# Patient Record
Sex: Male | Born: 1945 | Race: White | Hispanic: No | Marital: Married | Smoking: Never smoker
Health system: Southern US, Community
[De-identification: ages and names within clinical notes are randomized; demographics above are authoritative.]

## PROBLEM LIST (undated history)

## (undated) DIAGNOSIS — E8881 Metabolic syndrome: Secondary | ICD-10-CM

## (undated) DIAGNOSIS — F419 Anxiety disorder, unspecified: Secondary | ICD-10-CM

## (undated) DIAGNOSIS — N529 Male erectile dysfunction, unspecified: Secondary | ICD-10-CM

## (undated) DIAGNOSIS — F329 Major depressive disorder, single episode, unspecified: Secondary | ICD-10-CM

## (undated) DIAGNOSIS — G20A1 Parkinson's disease without dyskinesia, without mention of fluctuations: Secondary | ICD-10-CM

## (undated) DIAGNOSIS — N400 Enlarged prostate without lower urinary tract symptoms: Secondary | ICD-10-CM

## (undated) DIAGNOSIS — E785 Hyperlipidemia, unspecified: Secondary | ICD-10-CM

## (undated) DIAGNOSIS — R296 Repeated falls: Secondary | ICD-10-CM

## (undated) DIAGNOSIS — K648 Other hemorrhoids: Secondary | ICD-10-CM

## (undated) DIAGNOSIS — K5090 Crohn's disease, unspecified, without complications: Secondary | ICD-10-CM

## (undated) DIAGNOSIS — R319 Hematuria, unspecified: Secondary | ICD-10-CM

## (undated) DIAGNOSIS — F32A Depression, unspecified: Secondary | ICD-10-CM

## (undated) DIAGNOSIS — R2689 Other abnormalities of gait and mobility: Secondary | ICD-10-CM

## (undated) DIAGNOSIS — G20 Parkinson's disease: Secondary | ICD-10-CM

## (undated) HISTORY — DX: Major depressive disorder, single episode, unspecified: F32.9

## (undated) HISTORY — DX: Other hemorrhoids: K64.8

## (undated) HISTORY — DX: Crohn's disease, unspecified, without complications: K50.90

## (undated) HISTORY — DX: Parkinson's disease: G20

## (undated) HISTORY — DX: Benign prostatic hyperplasia without lower urinary tract symptoms: N40.0

## (undated) HISTORY — DX: Parkinson's disease without dyskinesia, without mention of fluctuations: G20.A1

## (undated) HISTORY — DX: Depression, unspecified: F32.A

## (undated) HISTORY — DX: Anxiety disorder, unspecified: F41.9

## (undated) HISTORY — DX: Male erectile dysfunction, unspecified: N52.9

## (undated) HISTORY — DX: Metabolic syndrome: E88.810

## (undated) HISTORY — PX: COLONOSCOPY W/ BIOPSIES: SHX1374

## (undated) HISTORY — DX: Metabolic syndrome: E88.81

## (undated) HISTORY — PX: PROSTATE BIOPSY: SHX241

## (undated) HISTORY — PX: OTHER SURGICAL HISTORY: SHX169

## (undated) HISTORY — DX: Hyperlipidemia, unspecified: E78.5

---
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
## 1977-06-12 DIAGNOSIS — K5090 Crohn's disease, unspecified, without complications: Secondary | ICD-10-CM

## 1977-06-12 HISTORY — DX: Crohn's disease, unspecified, without complications: K50.90

## 1993-06-12 HISTORY — PX: OTHER SURGICAL HISTORY: SHX169

## 2000-07-12 ENCOUNTER — Other Ambulatory Visit: Admission: RE | Admit: 2000-07-12 | Discharge: 2000-07-12 | Payer: Self-pay | Admitting: Urology

## 2000-07-12 ENCOUNTER — Encounter (INDEPENDENT_AMBULATORY_CARE_PROVIDER_SITE_OTHER): Payer: Self-pay | Admitting: Specialist

## 2002-08-13 ENCOUNTER — Encounter: Payer: Self-pay | Admitting: Internal Medicine

## 2002-08-13 ENCOUNTER — Ambulatory Visit (HOSPITAL_COMMUNITY): Admission: RE | Admit: 2002-08-13 | Discharge: 2002-08-13 | Payer: Self-pay | Admitting: Internal Medicine

## 2005-11-07 ENCOUNTER — Ambulatory Visit: Payer: Self-pay | Admitting: Internal Medicine

## 2005-11-14 ENCOUNTER — Ambulatory Visit: Payer: Self-pay | Admitting: Internal Medicine

## 2006-09-27 ENCOUNTER — Ambulatory Visit: Payer: Self-pay | Admitting: Internal Medicine

## 2006-10-10 ENCOUNTER — Ambulatory Visit: Payer: Self-pay | Admitting: Internal Medicine

## 2006-10-26 DIAGNOSIS — Z8639 Personal history of other endocrine, nutritional and metabolic disease: Secondary | ICD-10-CM

## 2006-10-26 DIAGNOSIS — F528 Other sexual dysfunction not due to a substance or known physiological condition: Secondary | ICD-10-CM

## 2006-10-26 DIAGNOSIS — K50012 Crohn's disease of small intestine with intestinal obstruction: Secondary | ICD-10-CM | POA: Insufficient documentation

## 2006-10-31 ENCOUNTER — Encounter: Payer: Self-pay | Admitting: Internal Medicine

## 2006-10-31 ENCOUNTER — Ambulatory Visit: Payer: Self-pay | Admitting: Internal Medicine

## 2007-04-10 ENCOUNTER — Ambulatory Visit: Payer: Self-pay | Admitting: Internal Medicine

## 2007-04-10 DIAGNOSIS — R972 Elevated prostate specific antigen [PSA]: Secondary | ICD-10-CM

## 2007-04-10 DIAGNOSIS — E782 Mixed hyperlipidemia: Secondary | ICD-10-CM

## 2007-04-10 DIAGNOSIS — E8881 Metabolic syndrome: Secondary | ICD-10-CM

## 2007-04-10 DIAGNOSIS — F329 Major depressive disorder, single episode, unspecified: Secondary | ICD-10-CM

## 2007-04-19 ENCOUNTER — Encounter: Payer: Self-pay | Admitting: Internal Medicine

## 2007-04-19 ENCOUNTER — Encounter (INDEPENDENT_AMBULATORY_CARE_PROVIDER_SITE_OTHER): Payer: Self-pay | Admitting: *Deleted

## 2007-05-13 ENCOUNTER — Ambulatory Visit: Payer: Self-pay | Admitting: Internal Medicine

## 2007-05-13 LAB — CONVERTED CEMR LAB
ALT: 28 units/L (ref 0–53)
AST: 20 units/L (ref 0–37)
Albumin: 3.8 g/dL (ref 3.5–5.2)
Alkaline Phosphatase: 62 units/L (ref 39–117)
BUN: 14 mg/dL (ref 6–23)
Basophils Absolute: 0 10*3/uL (ref 0.0–0.1)
Basophils Relative: 0.4 % (ref 0.0–1.0)
CO2: 29 meq/L (ref 19–32)
CRP, High Sensitivity: 1 (ref 0.00–5.00)
Calcium: 9.3 mg/dL (ref 8.4–10.5)
Chloride: 107 meq/L (ref 96–112)
Creatinine, Ser: 1 mg/dL (ref 0.4–1.5)
Eosinophils Absolute: 0.1 10*3/uL (ref 0.0–0.6)
Eosinophils Relative: 2.6 % (ref 0.0–5.0)
GFR calc Af Amer: 98 mL/min
GFR calc non Af Amer: 81 mL/min
Glucose, Bld: 107 mg/dL — ABNORMAL HIGH (ref 70–99)
HCT: 45.6 % (ref 39.0–52.0)
Hemoglobin: 15.6 g/dL (ref 13.0–17.0)
Lymphocytes Relative: 25.4 % (ref 12.0–46.0)
MCHC: 34.2 g/dL (ref 30.0–36.0)
MCV: 87 fL (ref 78.0–100.0)
Monocytes Absolute: 0.4 10*3/uL (ref 0.2–0.7)
Monocytes Relative: 9.5 % (ref 3.0–11.0)
Neutro Abs: 3 10*3/uL (ref 1.4–7.7)
Neutrophils Relative %: 62.1 % (ref 43.0–77.0)
Platelets: 233 10*3/uL (ref 150–400)
Potassium: 4.3 meq/L (ref 3.5–5.1)
RBC: 5.25 M/uL (ref 4.22–5.81)
RDW: 12.2 % (ref 11.5–14.6)
Sodium: 142 meq/L (ref 135–145)
Total Bilirubin: 0.6 mg/dL (ref 0.3–1.2)
Total Protein: 6.3 g/dL (ref 6.0–8.3)
WBC: 4.7 10*3/uL (ref 4.5–10.5)

## 2007-06-27 ENCOUNTER — Encounter: Payer: Self-pay | Admitting: Internal Medicine

## 2007-06-27 ENCOUNTER — Ambulatory Visit: Payer: Self-pay | Admitting: Internal Medicine

## 2007-07-01 DIAGNOSIS — F411 Generalized anxiety disorder: Secondary | ICD-10-CM

## 2007-07-01 DIAGNOSIS — N400 Enlarged prostate without lower urinary tract symptoms: Secondary | ICD-10-CM

## 2007-07-16 ENCOUNTER — Ambulatory Visit: Payer: Self-pay | Admitting: Internal Medicine

## 2007-08-07 ENCOUNTER — Ambulatory Visit: Payer: Self-pay | Admitting: Internal Medicine

## 2010-06-21 ENCOUNTER — Other Ambulatory Visit: Payer: Self-pay | Admitting: Internal Medicine

## 2010-06-21 ENCOUNTER — Ambulatory Visit
Admission: RE | Admit: 2010-06-21 | Discharge: 2010-06-21 | Payer: Self-pay | Source: Home / Self Care | Attending: Internal Medicine | Admitting: Internal Medicine

## 2010-06-21 ENCOUNTER — Encounter: Payer: Self-pay | Admitting: Internal Medicine

## 2010-06-21 DIAGNOSIS — G20 Parkinson's disease: Secondary | ICD-10-CM | POA: Insufficient documentation

## 2010-06-21 DIAGNOSIS — R269 Unspecified abnormalities of gait and mobility: Secondary | ICD-10-CM | POA: Insufficient documentation

## 2010-06-21 LAB — CBC WITH DIFFERENTIAL/PLATELET
Basophils Absolute: 0 10*3/uL (ref 0.0–0.1)
Basophils Relative: 0.4 % (ref 0.0–3.0)
Eosinophils Absolute: 0.2 10*3/uL (ref 0.0–0.7)
Eosinophils Relative: 2.7 % (ref 0.0–5.0)
HCT: 49.4 % (ref 39.0–52.0)
Hemoglobin: 16.9 g/dL (ref 13.0–17.0)
Lymphocytes Relative: 25.2 % (ref 12.0–46.0)
Lymphs Abs: 1.5 10*3/uL (ref 0.7–4.0)
MCHC: 34.3 g/dL (ref 30.0–36.0)
MCV: 86.3 fl (ref 78.0–100.0)
Monocytes Absolute: 0.4 10*3/uL (ref 0.1–1.0)
Monocytes Relative: 7.3 % (ref 3.0–12.0)
Neutro Abs: 3.8 10*3/uL (ref 1.4–7.7)
Neutrophils Relative %: 64.4 % (ref 43.0–77.0)
Platelets: 254 10*3/uL (ref 150.0–400.0)
RBC: 5.72 Mil/uL (ref 4.22–5.81)
RDW: 13.1 % (ref 11.5–14.6)
WBC: 5.9 10*3/uL (ref 4.5–10.5)

## 2010-06-21 LAB — BASIC METABOLIC PANEL
BUN: 14 mg/dL (ref 6–23)
CO2: 30 mEq/L (ref 19–32)
Calcium: 9.6 mg/dL (ref 8.4–10.5)
Chloride: 103 mEq/L (ref 96–112)
Creatinine, Ser: 0.9 mg/dL (ref 0.4–1.5)
GFR: 96.92 mL/min (ref >60.00)
Glucose, Bld: 92 mg/dL (ref 70–99)
Potassium: 4.4 mEq/L (ref 3.5–5.1)
Sodium: 141 mEq/L (ref 135–145)

## 2010-06-21 LAB — HEPATIC FUNCTION PANEL
ALT: 33 U/L (ref 0–53)
AST: 23 U/L (ref 0–37)
Albumin: 4.3 g/dL (ref 3.5–5.2)
Alkaline Phosphatase: 84 U/L (ref 39–117)
Bilirubin, Direct: 0 mg/dL (ref 0.0–0.3)
Total Bilirubin: 0.8 mg/dL (ref 0.3–1.2)
Total Protein: 7.1 g/dL (ref 6.0–8.3)

## 2010-06-21 LAB — TSH: TSH: 1.2 u[IU]/mL (ref 0.35–5.50)

## 2010-07-10 LAB — CONVERTED CEMR LAB
ALT: 25 units/L (ref 0–53)
AST: 19 units/L (ref 0–37)
Albumin: 4.1 g/dL (ref 3.5–5.2)
Alkaline Phosphatase: 60 units/L (ref 39–117)
BUN: 14 mg/dL (ref 6–23)
Basophils Absolute: 0 10*3/uL (ref 0.0–0.1)
Basophils Relative: 0.5 % (ref 0.0–1.0)
Bilirubin, Direct: 0.1 mg/dL (ref 0.0–0.3)
CO2: 30 meq/L (ref 19–32)
Calcium: 9.4 mg/dL (ref 8.4–10.5)
Chloride: 105 meq/L (ref 96–112)
Cholesterol, target level: 200 mg/dL
Cholesterol: 263 mg/dL (ref 0–200)
Creatinine, Ser: 1.1 mg/dL (ref 0.4–1.5)
Direct LDL: 199.7 mg/dL
Eosinophils Absolute: 0.1 10*3/uL (ref 0.0–0.6)
Eosinophils Relative: 2.8 % (ref 0.0–5.0)
GFR calc Af Amer: 88 mL/min
GFR calc non Af Amer: 73 mL/min
Glucose, Bld: 104 mg/dL — ABNORMAL HIGH (ref 70–99)
HCT: 47.5 % (ref 39.0–52.0)
HDL goal, serum: 40 mg/dL
HDL: 43.2 mg/dL (ref >39.0)
Hemoglobin: 16.3 g/dL (ref 13.0–17.0)
Hgb A1c MFr Bld: 5.4 % (ref 4.6–6.0)
LDL Goal: 160 mg/dL
Lymphocytes Relative: 29.6 % (ref 12.0–46.0)
MCHC: 34.2 g/dL (ref 30.0–36.0)
MCV: 87 fL (ref 78.0–100.0)
Monocytes Absolute: 0.5 10*3/uL (ref 0.2–0.7)
Monocytes Relative: 9.3 % (ref 3.0–11.0)
Neutro Abs: 2.8 10*3/uL (ref 1.4–7.7)
Neutrophils Relative %: 57.8 % (ref 43.0–77.0)
PSA: 2.17 ng/mL (ref 0.10–4.00)
Platelets: 242 10*3/uL (ref 150–400)
Potassium: 4.2 meq/L (ref 3.5–5.1)
RBC: 5.46 M/uL (ref 4.22–5.81)
RDW: 12.5 % (ref 11.5–14.6)
Sodium: 140 meq/L (ref 135–145)
TSH: 1.19 microintl units/mL (ref 0.35–5.50)
Total Bilirubin: 0.8 mg/dL (ref 0.3–1.2)
Total CHOL/HDL Ratio: 6.1
Total Protein: 6.6 g/dL (ref 6.0–8.3)
Triglycerides: 139 mg/dL (ref 0–149)
VLDL: 28 mg/dL (ref 0–40)
WBC: 4.9 10*3/uL (ref 4.5–10.5)

## 2010-07-14 NOTE — Progress Notes (Signed)
Summary: Patient List of Concerns  Patient List of Concerns   Imported By: Edmonia James 06/28/2010 11:04:30  _____________________________________________________________________  External Attachment:    Type:   Image     Comment:   External Document

## 2010-07-14 NOTE — Assessment & Plan Note (Signed)
Summary: cpx/fasting/kn   Vital Signs:  Patient profile:   65 year old male Height:      73.25 inches Weight:      253 pounds BMI:     33.27 Temp:     98.6 degrees F oral Pulse rate:   84 / minute Resp:     14 per minute BP sitting:   130 / 90  (left arm) Cuff size:   large  Vitals Entered By: Georgette Dover CMA (June 21, 2010 11:06 AM) CC: CPX with fasting labs  Comments Patient given information for Shingles Vaccine/ will check with insurance for coverage    CC:  CPX with fasting labs .  History of Present Illness:    Mr Kyle Ball is here for a physical; he has multiple concerns (see scanned typed sheet). Hyperlipidemia Follow-Up:     Tricor & Crestor have not been taken since 2009.  Other symptoms include dypsnea due to deconditioning.  The patient denies the following symptoms: chest pain/pressure, exercise intolerance, palpitations, syncope, and pedal edema.  Dietary compliance has been fair- good , mainly Atkin's low carb.  The patient reports exercising occasionally.  Adjunctive measures currently used by the patient include 81 mg  ASA.    Preventive Screening-Counseling & Management  Alcohol-Tobacco     Smoking Status: never  Current Medications (verified): 1)  Wellbutrin Sr 150 Mg  Tb12 (Bupropion Hcl) .... Take One Tablet Twice Daily 2)  Adderall 5 Mg  Tabs (Amphetamine-Dextroamphetamine) .... Take Two Tabs Daily 3)  Flomax 0.4 Mg  Cp24 (Tamsulosin Hcl) .... Take One Tablet Daily 4)  Ambien 5 Mg  Tabs (Zolpidem Tartrate) .... Take One Tablet As Needed 5)  Viagra 100 Mg  Tabs (Sildenafil Citrate) .... As Needed 6)  Aspirin 81 Mg Tabs (Aspirin) .Marland Kitchen.. 1 By Mouth Once Daily  Allergies: 1)  ! Pcn  Past History:  Past Medical History: INTERNAL HEMORRHOIDS (ICD-455.0) BENIGN PROSTATIC HYPERTROPHY, HX OF (ICD-V13.8), Dr Risa Grill DISORDER, DYSMETABOLIC SYNDROME X (OEU-235.3) ELEVATED PROSTATE SPECIFIC ANTIGEN (ICD-790.93), PMH of  DISORDER, DEPRESSIVE NEC (ICD-311), Dr  Dorann Ou HYPERLIPIDEMIA (ICD-272.2): NMR Lipoprofile 2008: LDL 132(2894/2274), HDL 34, TG 359. ERECTILE DYSFUNCTION (ICD-302.72) CROHN'S DISEASE (ICD-555.9), PMH of   Past Surgical History: Quadriceps rupture 1966: Crohn's of  caecum with fistula  ; terminal  ileum & appendix  resected 1995; prostate biopsy  X 2 Colonoscopy 1999, 2004, 2009. ? Crohn's recurrence @ anastomosis 2009, Dr Carlean Purl  Family History: Family History Diabetes 1st degree relative Family History Hypertension Father: HTN,DM, CVA Mother: liver cancer ,  metastatic from  uterus Siblings: bro: testicular  cancer , sister: fibromyalgia  Social History: no diet Occupation: Licensed conveyancer Married Never Smoked Alcohol use-yes  Review of Systems  The patient denies anorexia, fever, vision loss, decreased hearing, prolonged cough, hemoptysis, abdominal pain, melena, hematochezia, severe indigestion/heartburn, hematuria, suspicious skin lesions, unusual weight change, abnormal bleeding, enlarged lymph nodes, and angioedema.         Weight "yo yos". ENT:  Complains of difficulty swallowing; denies hoarseness. MS:  Complains of joint pain and muscle aches; denies joint redness and joint swelling. Neuro:  Complains of memory loss; Writing becomes "cramped" ; gait "suffling". ? dysphagia with cheese rarely. Psych:  ? slow movements of UE; ? shuffling gait.  Physical Exam  General:  Decreased facial expression,in no acute distress; ,appropriate and cooperative throughout examination Head:  Normocephalic and atraumatic without obvious abnormalities. No apparent alopecia or balding. Eyes:  No corneal or conjunctival inflammation  noted. EOMI. Perrla. Funduscopic exam benign, without hemorrhages, exudates or papilledema. Field of  Vision grossly normal. Ears:  External ear exam shows no significant lesions or deformities.  Otoscopic examination reveals clear canals, tympanic membranes are intact bilaterally  without bulging, retraction, inflammation or discharge. Hearing is grossly normal bilaterally. Nose:  External nasal examination shows no deformity or inflammation. Nasal mucosa are pink and moist without lesions or exudates. Septal dislocation Mouth:  Oral mucosa and oropharynx without lesions or exudates.  Teeth in good repair. Neck:  No deformities, masses, or tenderness noted. Lungs:  Normal respiratory effort, chest expands symmetrically. Lungs are clear to auscultation, no crackles or wheezes. Heart:  normal rate, regular rhythm, no gallop, no rub, no JVD, no HJR, and LDJTT0/1-7   /6 systolic murmur.   Abdomen:  Bowel sounds positive,abdomen soft and non-tender without masses, organomegaly . Weakness L inguinal area  w/o frank frank hernia. Mid line op scar Genitalia:  Testes bilaterally descended without nodularity, tenderness or masses. No scrotal masses or lesions. No penis lesions or urethral discharge.L varicocele.   Msk:  No deformity or scoliosis noted of thoracic or lumbar spine.   Pulses:  R and L carotid,radial,dorsalis pedis and posterior tibial pulses are full and equal bilaterally Extremities:  No clubbing, cyanosis, edema, or deformity noted with normal full range of motion of all joints.  L> R knee crepitus Neurologic:   oriented X3, cranial nerves II-XII intact, strength normal in all extremities, sensation intact to light touch, gait normal except decreased arm swing, DTRs symmetrical and normal, finger-to-nose normal, and Romberg negative.  Fine , non pil rolling tremor of hands Skin:  Intact without suspicious lesions or rashes. Minor keratoses of hands Cervical Nodes:  No lymphadenopathy noted Axillary Nodes:  No palpable lymphadenopathy Psych:  normally interactive, good eye contact, and not anxious appearing.     Impression & Recommendations:  Problem # 1:  ROUTINE GENERAL MEDICAL EXAM@HEALTH  CARE FACL (ICD-V70.0)  Orders: EKG w/ Interpretation (93000) Venipuncture  (79390) TLB-BMP (Basic Metabolic Panel-BMET) (30092-ZRAQTMA) TLB-CBC Platelet - w/Differential (85025-CBCD) TLB-Hepatic/Liver Function Pnl (80076-HEPATIC) TLB-TSH (Thyroid Stimulating Hormone) (84443-TSH) T- * Misc. 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 medication effect  Problem # 3:  ABNORMALITY OF GAIT (ICD-781.2)  Problem # 4:  HYPERLIPIDEMIA (LKT-625.2)  The following medications were removed from the medication list:    Tricor 145 Mg Tabs (Fenofibrate) .Marland Kitchen... 1 qd    Crestor 20 Mg Tabs (Rosuvastatin calcium) .Marland Kitchen... As directed  Problem # 5:  CROHN'S DISEASE (ICD-555.9)  Complete Medication List: 1)  Wellbutrin Sr 150 Mg Tb12 (Bupropion hcl) .... Take one tablet twice daily 2)  Adderall 5 Mg Tabs (Amphetamine-dextroamphetamine) .... Take two tabs daily 3)  Flomax 0.4 Mg Cp24 (Tamsulosin hcl) .... Take one tablet daily 4)  Ambien 5 Mg Tabs (Zolpidem tartrate) .... Take one tablet as needed 5)  Viagra 100 Mg Tabs (Sildenafil citrate) .... As needed 6)  Aspirin 81 Mg Tabs (Aspirin) .Marland Kitchen.. 1 by mouth once daily  Other Orders: Tdap => 27yr IM ((63893 Admin 1st Vaccine ([phone]  Patient Instructions: 1)  Neurology referral if labs are normal. 2)  It is important that you exercise regularly at least 20 minutes 5 times a week. If you develop chest pain, have severe difficulty breathing, or feel very tired , stop exercising immediately and seek medical attention. 3)  Take an Aspirin every day. Avoid HFCS sugar as discussed.   Orders Added:  1)  Tdap => 47yr IM [90715] 2)  Admin 1st Vaccine [90471] 3)  Est. Patient 40-64 years [99396] 4)  EKG w/ Interpretation [93000] 5)  Venipuncture [36415] 6)  TLB-BMP (Basic Metabolic Panel-BMET) [[94834-FHSVEXO]7)  TLB-CBC Platelet - w/Differential [85025-CBCD] 8)  TLB-Hepatic/Liver Function Pnl [80076-HEPATIC] 9)  TLB-TSH (Thyroid Stimulating Hormone) [84443-TSH] 10)  T- * Misc.  Laboratory test [[phone]  Immunizations Administered:  Tetanus Vaccine:    Vaccine Type: Tdap    Site: right deltoid    Mfr: GlaxoSmithKline    Dose: 0.5 ml    Route: IM    Given by: CGeorgette DoverCMA    Exp. Date: 03/31/2012    Lot #: [id number]   VIS given: 04/29/08 version given June 21, 2010.   Immunizations Administered:  Tetanus Vaccine:    Vaccine Type: Tdap    Site: right deltoid    Mfr: GlaxoSmithKline    Dose: 0.5 ml    Route: IM    Given by: CGeorgette DoverCMA    Exp. Date: 03/31/2012    Lot #: [id number]   VIS given: 04/29/08 version given June 21, 2010.

## 2010-08-03 ENCOUNTER — Encounter: Payer: Self-pay | Admitting: Internal Medicine

## 2010-08-03 ENCOUNTER — Ambulatory Visit (INDEPENDENT_AMBULATORY_CARE_PROVIDER_SITE_OTHER): Payer: BC Managed Care – PPO | Admitting: Internal Medicine

## 2010-08-03 DIAGNOSIS — G259 Extrapyramidal and movement disorder, unspecified: Secondary | ICD-10-CM

## 2010-08-03 DIAGNOSIS — E785 Hyperlipidemia, unspecified: Secondary | ICD-10-CM

## 2010-08-09 NOTE — Assessment & Plan Note (Signed)
Summary: to discuss the labs--ph   Vital Signs:  Patient profile:   65 year old male Height:      73.25 inches Weight:      253 pounds Temp:     98.2 degrees F oral Pulse rate:   80 / minute Resp:     15 per minute BP sitting:   150 / 90  (left arm)  Vitals Entered By: Kyle Ball CMA (August 03, 2010 12:09 PM) CC: review lab result   CC:  review lab result.  History of Present Illness:    Labs reviewed & risks discussed with him & his wife Kyle Ball. Very high long term risk suggested by very  significant abnormalities on [facility]  Panel.These results support risk suggested by prior NMR Lipoprofile which was also reviewed. Role of High Fructose Corn Syrup in raising TG discussed. He  denies the following symptoms: chest pain/pressure, exercise intolerance, dypsnea, palpitations, syncope, and pedal edema.  Dietary compliance has been fair.  The patient reports exercising occasionally , but he plans  to initiate a program.                                                                                                The question of a movement disorder was discussed. Kyle Ball expresses concern . I recommended that this be discussed with Dr Kyle Ball & Neurology referral be made if signs & symptoms are not felt to be med related. I stated I did not feel this is iatrogenic.  Current Medications (verified): 1)  Wellbutrin Sr 150 Mg  Tb12 (Bupropion Hcl) .... Take One Tablet Twice Daily 2)  Adderall 5 Mg  Tabs (Amphetamine-Dextroamphetamine) .... Take Two Tabs Daily 3)  Flomax 0.4 Mg  Cp24 (Tamsulosin Hcl) .... Take One Tablet Daily 4)  Ambien 5 Mg  Tabs (Zolpidem Tartrate) .... Take One Tablet As Needed 5)  Viagra 100 Mg  Tabs (Sildenafil Citrate) .... As Needed 6)  Aspirin 81 Mg Tabs (Aspirin) .Marland Kitchen.. 1 By Mouth Once Daily  Allergies (verified): 1)  ! Pcn  Physical Exam  General:  well-nourished,in no acute distress;  cooperative throughout examination but some "la belle indifference"  suggested. Minimal facial expression Neurologic:  oriented X3 but he repeated wrong answer in 4 option quiz. Kyle Ball pointed this out to him.   Psych:  interactive but  flat, subdued  affect     Impression & Recommendations:  Problem # 1:  HYPERLIPIDEMIA (ICD-272.2)  His updated medication list for this problem includes:    Atorvastatin Calcium 20 Mg Tabs (Atorvastatin calcium) .Marland Kitchen... 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  Complete Medication List: 1)  Wellbutrin Sr 150 Mg Tb12 (Bupropion hcl) .... Take one tablet twice daily 2)  Adderall 5 Mg Tabs (Amphetamine-dextroamphetamine) .... Take two tabs daily 3)  Flomax 0.4 Mg Cp24 (Tamsulosin hcl) .... Take one tablet daily 4)  Ambien 5 Mg Tabs (Zolpidem tartrate) .... Take one tablet as needed 5)  Viagra 100 Mg Tabs (Sildenafil citrate) .Marland KitchenMarland KitchenMarland Kitchen  As needed 6)  Aspirin 81 Mg Tabs (Aspirin) .Marland Kitchen.. 1 by mouth once daily 7)  Atorvastatin Calcium 20 Mg Tabs (Atorvastatin calcium) .Marland Kitchen.. 1 once daily  Patient Instructions: 1)   Please see Dr Kyle Ball & discuss need for Neurollogy  consultation.Avoid High Fructose Corn Syrup ; consider low carb diet (NOT Atkins') 2)  Please schedule a follow-up appointment in 3 months. 3)  Hepatic Panel prior to visit, ICD-9:995.20 4)  Lipid Panel prior to visit, ICD-9:272.4 5)  It is important that you exercise regularly at least 20 minutes 5 times a week. If you develop chest pain, have severe difficulty breathing, or feel very tired , stop exercising immediately and seek medical attention.  6)  Take an Aspirin every day. Prescriptions: ATORVASTATIN CALCIUM 20 MG TABS (ATORVASTATIN CALCIUM) 1 once daily  #30 x 2   Entered and Authorized by:   Kyle Melnick MD   Signed by:   Kyle Melnick MD on 08/03/2010   Method used:   Print then Give to Patient   RxID:   [id number]    Orders Added: 1)   Est. Patient Level III [29562] 2)  Nutrition Referral [Nutrition]

## 2010-10-25 NOTE — Assessment & Plan Note (Signed)
[place]                         GASTROENTEROLOGY OFFICE NOTE   MIHIR, FLANIGAN                    MRN:          [id number]  DATE:05/13/2007                            DOB:          02-26-1946    CHIEF COMPLAINT:  Abdominal pain, history of Crohn's.   Mr. Barriere is a 65 year old white man with known history of Crohn's  disease.  Diagnosed years ago in [place], followed in [place], [place]  for years.  I last saw him in 2005, January.  He had been scheduled for  a colonoscopy to reassess.  He never could get that done, due to a  business schedule and he felt well, and so he did not have it done.  Earlier in November, he developed some relatively acute right upper  quadrant pain, felt like he needed to go to the bathroom and defecate  but could not, nauseous, did not vomit, and eventually that passed.  He  did take 60 mg of old prednisone.  He has come in today.  I have had him  draw labs.  They are not back yet.  He feels fine at this time.  He is  trying to lose weight.  He has been eating a lot of fresh fruit, raw or  steamed vegetables mainly.  He is not complaining of significant  diarrhea or constipation, and he feels okay at this time.   PAST MEDICAL HISTORY/PROBLEMS:  1. DRUG ALLERGY TO PENICILLIN.  2. Crohn's disease status post right hemicolectomy and entero-antero      fistula repair.  3. Last colonoscopy 1998 with mild ileitis and small bowel follow      through in 2004.  He has had minimal changes at the anastomosis.      He has not been on any Crohn's therapy for some time.  4. Dyslipidemia with hypertriglyceridemia.  5. Anxiety depression.  6. Prior quadriceps rupture.  7. Benign prostatic hypertrophy followed by Dr. Risa Grill.  8. Erectile dysfunction.  9. Obesity.   FAMILY HISTORY:  Uterine cancer in his mother, he thinks.  Diabetes and  heart disease in his father.  No colon cancer of inflammatory bowel  disease.   SOCIAL HISTORY:  He is separated.  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  Uses some alcohol.  No tobacco or drugs.   REVIEW OF SYSTEMS:  See my medical history form.   PHYSICAL EXAMINATION:  GENERAL:  Reveals an obese, pleasant white man,  height 6 feet 2, weight 239 pounds, blood pressure 138/74, pulse 80.  HEENT:  Eyes anicteric.  ENT:  Normal lips, nose.  NECK:  Supple, no thyromegaly or mass.  CHEST:  Clear.  HEART:  S1, S2, no rubs or gallops.  ABDOMEN:  Soft, nontender, no organomegaly or mass.  RECTAL:  Deferred.  LYMPHATIC:  No groin, neck or supraclavicular adenopathy detected.  LOWER EXTREMITIES:  Free of edema.  SKIN:  Warm, dry.  No acute rash in areas inspected of the trunk, in the  lower extremity, torso and the face.  NEURO/PSYCH:  Alert and oriented x3.  Grossly nonfocal, appropriate  affect.  ASSESSMENT:  Right upper quadrant pain, transient.  He certainly could  have had a transient obstruction.  He has been eating more fiber rich  foods, and sometimes they do not adjust properly, and he could have had  an obstruction for something like that or simply from recurrent Crohn's  disease, which is also possible.  Adhesions could have been related as  well.   PLAN:  1. Follow up on the labs we ordered today.  2. Schedule a colonoscopy.  He may not be able to do this until      January.  3. Stay on a low residue diet for the time being.  He is congratulated      on trying to lose weight.  That is important, but I am concerned      about the high fiber foods and the possibility of obstruction from      them.   Further plans pending above.  No specific therapy at this time.     Gatha Mayer, MD,FACG  Electronically Signed    CEG/MedQ  DD: 05/13/2007  DT: 05/13/2007  Job #: [id number]   cc:   Darrick Penna. Linna Darner, MD,FACP,FCCP  Bernestine Amass, M.D.

## 2010-10-25 NOTE — Assessment & Plan Note (Signed)
[place]                         GASTROENTEROLOGY OFFICE NOTE   ORLANDA, LEMMERMAN                    MRN:          [id number]  DATE:07/16/2007                            DOB:          12/15/1945    CHIEF COMPLAINT:  Followup of Crohn's disease.   This is a discussion today.   His height was 6 feet 2 inches, weight was 244 pounds, pulse 72, blood  pressure 122/82.   Mr. Yandow and I discussed his Crohn's disease.  He has a couple of  ulcers at his anastomosis after his right hemicolectomy.  This  colonoscopy was performed June 27, 2007.  Other than that it looked  normal.  He has had rare possible obstructive symptoms a couple of times  since 2003.  He is not on any Crohn's specific therapy at this time.  We  spent 20 minutes today discussing his Crohn's disease and history and  going over things.  I reviewed the nature of the disease and the  possibility of going on medication or not, i.e., observation phase.  He  has really done very well over the years without any medication.  We  discussed the possibility of investigating the small bowel further, but  again, he has minimal, if any, symptoms, and when he does have these  possible flares they only last for a couple of days which raises the  question of whether it is truly a Crohn's flare or not.  He is asking  whether or not he should have his B12 level checked.  He has never had a  DEXA scan, though he has had many years of prednisone in the 1970s and  1980s, it sounds like.   ASSESSMENT:  Crohn's ileitis, status post right hemicolectomy for that  with minimal symptoms since that time.  He did have a small bowel  fistula repair when he had his right hemicolectomy in the early 1990s as  well.   PLAN:  1. Check a B12 level and a 25-hydroxy vitamin D level given his      situation.  He wants this added to labs Dr. Linna Darner is doing in      February.  We will do so and notify Dr.  Clayborn Heron staff.  2. DEXA bone density scanning is ordered given his previous history of      prednisone use.  He is at risk for osteopenia and osteoporosis.  3. He needs a screening colonoscopy for polyps and cancer in 10 years,      investigate signs or symptoms earlier.  4. I will see him back as needed at this point, though likely within a      year would be reasonable.     Gatha Mayer, MD,FACG  Electronically Signed    CEG/MedQ  DD: 07/16/2007  DT: 07/16/2007  Job #: [id number]   cc:   Darrick Penna. Linna Darner, MD,FACP,FCCP

## 2010-10-31 ENCOUNTER — Other Ambulatory Visit: Payer: Self-pay | Admitting: *Deleted

## 2010-10-31 DIAGNOSIS — T887XXA Unspecified adverse effect of drug or medicament, initial encounter: Secondary | ICD-10-CM

## 2010-10-31 DIAGNOSIS — E785 Hyperlipidemia, unspecified: Secondary | ICD-10-CM

## 2010-11-01 ENCOUNTER — Other Ambulatory Visit (INDEPENDENT_AMBULATORY_CARE_PROVIDER_SITE_OTHER): Payer: BC Managed Care – PPO

## 2010-11-01 ENCOUNTER — Other Ambulatory Visit (INDEPENDENT_AMBULATORY_CARE_PROVIDER_SITE_OTHER): Payer: BC Managed Care – PPO | Admitting: Internal Medicine

## 2010-11-01 DIAGNOSIS — E785 Hyperlipidemia, unspecified: Secondary | ICD-10-CM

## 2010-11-01 DIAGNOSIS — T887XXA Unspecified adverse effect of drug or medicament, initial encounter: Secondary | ICD-10-CM

## 2010-11-01 DIAGNOSIS — Z13220 Encounter for screening for lipoid disorders: Secondary | ICD-10-CM

## 2010-11-01 LAB — HEPATIC FUNCTION PANEL: Total Bilirubin: 0.8 mg/dL (ref 0.3–1.2)

## 2010-11-01 LAB — LIPID PANEL
Cholesterol: 176 mg/dL (ref 0–200)
Total CHOL/HDL Ratio: 4
Triglycerides: 248 mg/dL — ABNORMAL HIGH (ref 0.0–149.0)
VLDL: 49.6 mg/dL — ABNORMAL HIGH (ref 0.0–40.0)

## 2010-11-10 ENCOUNTER — Encounter: Payer: Self-pay | Admitting: Internal Medicine

## 2010-11-10 ENCOUNTER — Ambulatory Visit (INDEPENDENT_AMBULATORY_CARE_PROVIDER_SITE_OTHER): Payer: BC Managed Care – PPO | Admitting: Internal Medicine

## 2010-11-10 VITALS — BP 138/86 | HR 71 | Wt 240.8 lb

## 2010-11-10 DIAGNOSIS — E782 Mixed hyperlipidemia: Secondary | ICD-10-CM

## 2010-11-10 MED ORDER — ATORVASTATIN CALCIUM 20 MG PO TABS: 20.0000 mg | ORAL_TABLET | Freq: Every day | ORAL | Status: DC

## 2010-11-10 NOTE — Patient Instructions (Signed)
Preventive Health Care: Exercise at least 30-45 minutes a day,  3-4 days a week.  Eat a low-fat diet with lots of fruits and vegetables, up to 7-9 servings per day. Avoid obesity; your goal is waist measurement < 40 inches.Consume less than 40 grams of sugar per day from foods & drinks with High Fructose Corn Sugar as #2,3 or # 4 on label. The most common cause of elevated triglycerides is the ingestion of sugar from high fructose corn syrup sources. As TG go up, HDL or good cholesterol goes down. Also uric acid which causes gout will go up. Alcohol If you drink, do it moderately,less than 9 drinks per week, preferably less than 6 @ most. Recheck fasting Lipids & A1c in 4 months (277.7)

## 2010-11-10 NOTE — Progress Notes (Signed)
  Subjective:    Patient ID: Kyle Ball, male    DOB: 25-Feb-1946, 65 y.o.   MRN: [id number]  HPI Dyslipidemia assessment: Prior Advanced Lipid Testing: [facility] Panel & NMR Lipoprofile.   Family history of premature CAD/ MI: father @ 42 .  Nutrition: no .  Exercise: walking 2X/ week . Diabetes : no . HTN: no. Smoking history  : never .   Weight :  Down 15#. ROS: fatigue: some ; chest pain : no ;claudication: no; palpitations: no; abd pain/bowel changes: minor bowel symptoms ; myalgias:no;  syncope : no ; memory loss: no;skin changes: no. Lab results reviewed  & risks discussed    Review of Systems     Objective:   Physical Exam General appearance is one of good health and nourishment.Eyes: No conjunctival inflammation or scleral icterus is present.  Heart:  Normal rate and regular rhythm. S1 and S2 normal without gallop, murmur, click, rub or other extra sounds     Lungs:Chest clear to auscultation; no wheezes, rhonchi,rales ,or rubs present.No increased work of breathing.   Skin:Warm & dry.  Intact without suspicious lesions or rashes ; no jaundice or tenting             Assessment & Plan:  Dyslipidemia; dramatic improvement in the LDL which greater than 90% reduction in risk. Major component at this time appears to be dietary with triglycerides > 150.  Plan: No change in medications; dietary interventions to reduce High Fructose Corn Syrup sugar sources.

## 2010-12-26 ENCOUNTER — Encounter: Payer: BC Managed Care – PPO | Attending: Internal Medicine | Admitting: *Deleted

## 2010-12-26 ENCOUNTER — Encounter: Payer: Self-pay | Admitting: *Deleted

## 2010-12-26 DIAGNOSIS — E785 Hyperlipidemia, unspecified: Secondary | ICD-10-CM | POA: Insufficient documentation

## 2010-12-26 DIAGNOSIS — Z713 Dietary counseling and surveillance: Secondary | ICD-10-CM | POA: Insufficient documentation

## 2010-12-26 NOTE — Patient Instructions (Addendum)
Goals:  Discontinue meal skipping.  Aim for 1800 calories per day with lean protein, low fat dairy, more whole grains and fruits/vegetables.  Decrease alcohol consumption.  Increase exercise to 3-4 days week for 30 min.  Call me with any questions or concerns you have.    Follow up with me in October 2012 after MD visit.

## 2010-12-26 NOTE — Progress Notes (Signed)
  Medical Nutrition Therapy:  Appt start time: 8:30a end time:  9:30a.  Assessment:  Primary concerns today: Hyperlipidemia.  Pt with flat affect here for diet therapy. Pt has lost ~12 lbs since MD visit (11/01/10) and made many dietary changes.  Reports avoiding the snack machine and candy bowl at work as well as making better choices when eating out. Increased sodium noted in diet.  States he only has flares from Crohn's approximately once every 2 years and does well with all foods. Also reports decreased cheese intake, which he used to eat "all the time".   MEDICATIONS: Wellbutrin, Flonase, ASA, Lipitor, Levitra, Ropinirole   DIETARY INTAKE:  Usual eating pattern includes 2 meals and 0-1 snacks per day.  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  Estimated energy needs: 1800 calories 225 g carbohydrates 100-110 g protein 50-55 g fat < 14g saturated fat 200 mg or less of cholesterol  Progress Towards Goal(s):  New.   Nutritional Diagnosis:  NI-5.6.2 Excessive fat intake related to dietary intake pattern as evidenced by dx of hyperlipidemia.    Intervention/Goals:  Discontinue meal skipping.  Aim for 1800 calories per day with lean protein, low fat dairy, more whole grains and fruits/vegetables.  CHO: 225g  Pro: 110-115g  Fat: 50-60g  Decrease alcohol consumption.  Increase exercise to 3-4 days week (minimum) for 30+ min.  Call me with any questions or concerns you have.    Follow up with me in Oct 2012 after MD visit.  Monitoring/Evaluation:  Dietary intake, exercise, lipid panel, and body weight in 3 month(s).

## 2011-02-09 ENCOUNTER — Telehealth: Payer: Self-pay | Admitting: Internal Medicine

## 2011-02-09 NOTE — Telephone Encounter (Signed)
Given his hx of Crohn's disease he should come in to see me in next 1-2 weeks regardless

## 2011-02-09 NOTE — Telephone Encounter (Signed)
Patient is having diarrhea and rectal leakage.  He reports 1-3 loose stools a day and occasional fecal incontinence. I have asked him to try imodium 1-2 times a day and increase the amount of fiber in his diet and start on the fiber supplement of his choice.  If this does not help he is to call me back by mid next week and I will work him into a schedule.  The patient agrees with the plan.  He is to call me back with an update next week.

## 2011-02-10 NOTE — Telephone Encounter (Signed)
Scheduled patient on 02/21/11 at 3:15 PM. Patient aware.

## 2011-02-16 ENCOUNTER — Ambulatory Visit: Payer: BC Managed Care – PPO | Admitting: Internal Medicine

## 2011-02-21 ENCOUNTER — Ambulatory Visit: Payer: BC Managed Care – PPO | Admitting: Internal Medicine

## 2011-03-10 ENCOUNTER — Encounter: Payer: Self-pay | Admitting: Internal Medicine

## 2011-03-13 ENCOUNTER — Ambulatory Visit: Payer: BC Managed Care – PPO | Admitting: Internal Medicine

## 2011-03-13 DIAGNOSIS — Z0289 Encounter for other administrative examinations: Secondary | ICD-10-CM

## 2011-03-27 ENCOUNTER — Other Ambulatory Visit (INDEPENDENT_AMBULATORY_CARE_PROVIDER_SITE_OTHER): Payer: BC Managed Care – PPO

## 2011-03-27 ENCOUNTER — Ambulatory Visit (INDEPENDENT_AMBULATORY_CARE_PROVIDER_SITE_OTHER): Payer: BC Managed Care – PPO | Admitting: Internal Medicine

## 2011-03-27 ENCOUNTER — Encounter: Payer: Self-pay | Admitting: Internal Medicine

## 2011-03-27 DIAGNOSIS — K5090 Crohn's disease, unspecified, without complications: Secondary | ICD-10-CM

## 2011-03-27 DIAGNOSIS — E538 Deficiency of other specified B group vitamins: Secondary | ICD-10-CM

## 2011-03-27 LAB — CBC WITH DIFFERENTIAL/PLATELET
Basophils Absolute: 0 10*3/uL (ref 0.0–0.1)
Lymphocytes Relative: 23.2 % (ref 12.0–46.0)
Monocytes Relative: 6.3 % (ref 3.0–12.0)
Neutrophils Relative %: 67.9 % (ref 43.0–77.0)
Platelets: 230 10*3/uL (ref 150.0–400.0)
RDW: 13.4 % (ref 11.5–14.6)
WBC: 6.9 10*3/uL (ref 4.5–10.5)

## 2011-03-27 NOTE — Assessment & Plan Note (Signed)
Not on therapy Need to recheck level

## 2011-03-27 NOTE — Assessment & Plan Note (Addendum)
?   If recent sxs of constipation followed by multiple loose stools are related Evaluate with CBC, CRP, fecal lactoferrin Depending upon results would consider trial of Entocort - discussed with him Could need imaging or colonosocpy also but would consider therapeutic trial first

## 2011-03-27 NOTE — Patient Instructions (Signed)
Please go to the basement upon leaving today to have your labs done. 

## 2011-03-27 NOTE — Progress Notes (Signed)
  Subjective:    Patient ID: Kyle Ball, male    DOB: 07/19/1945, 65 y.o.   MRN: [id number]  HPI 65 year old white man with a history of Crohn's disease, status post ileocolonic resection. He has done well without medication for a number of years. More recently he had some change in bowel habits over the last 9 months or so. Developed problems with constipation followed by multiple small volume and loose stools.  He called [place] and RN advised fiber supplements. Since then he is much better. He does note that Olestra and possibly other cooking oils may cause diarrhea - with urgent stools. That seems better also. He does say his bowel habits are not regular and formed as they were previously however i.e. has not fully returned to his prior status.   Review of Systems As above    Objective:   Physical Exam General: obese, NAD Eyes: anicteric Lungs: clear Heart: S1S2 no rubs, murmurs or gallops Abdomen: soft and nontender, BS+ Ext: no edema Neuro: slow movement, mild masked facies          Assessment & Plan:

## 2011-03-28 LAB — C-REACTIVE PROTEIN: CRP: 0.1 mg/dL (ref <0.60)

## 2011-03-28 LAB — VITAMIN B12: Vitamin B-12: 294 pg/mL (ref 211–911)

## 2011-03-28 NOTE — Progress Notes (Signed)
Quick Note:  Labs are ok Still waiting on stool WBC His B12 level is ok but in low normal range  I think having an injection of 1000 ug IM x 1 is appropriate (DO THIS) and should get a level annually ______

## 2011-03-29 ENCOUNTER — Other Ambulatory Visit: Payer: Self-pay

## 2011-03-29 DIAGNOSIS — E538 Deficiency of other specified B group vitamins: Secondary | ICD-10-CM

## 2011-03-29 MED ORDER — CYANOCOBALAMIN 1000 MCG/ML IJ SOLN
1000.0000 ug | Freq: Once | INTRAMUSCULAR | Status: AC
  Administered 2011-03-30: 1000 ug via INTRAMUSCULAR

## 2011-03-30 ENCOUNTER — Ambulatory Visit (INDEPENDENT_AMBULATORY_CARE_PROVIDER_SITE_OTHER): Payer: BC Managed Care – PPO | Admitting: Internal Medicine

## 2011-03-30 ENCOUNTER — Encounter: Payer: Self-pay | Admitting: Internal Medicine

## 2011-03-30 DIAGNOSIS — E538 Deficiency of other specified B group vitamins: Secondary | ICD-10-CM

## 2011-03-31 ENCOUNTER — Telehealth: Payer: Self-pay

## 2011-03-31 DIAGNOSIS — K5090 Crohn's disease, unspecified, without complications: Secondary | ICD-10-CM

## 2011-03-31 NOTE — Telephone Encounter (Signed)
Patient scheduled for 04/05/11 9:30 scan will be at 10:30.  He is advised of the appt date and time and to be NPO for 4 hours prior

## 2011-03-31 NOTE — Telephone Encounter (Signed)
Message copied by Annett Fabian on Fri Mar 31, 2011  3:16 PM ------      Message from: Stan Head E      Created: Thu Mar 30, 2011  4:59 PM      Regarding: CT-E       Please order a CT-enterography for this patient             Reason is to evaluate Crohn's disease            I have explained it to him

## 2011-04-04 ENCOUNTER — Ambulatory Visit: Payer: BC Managed Care – PPO

## 2011-04-04 DIAGNOSIS — K5090 Crohn's disease, unspecified, without complications: Secondary | ICD-10-CM

## 2011-04-05 ENCOUNTER — Ambulatory Visit (INDEPENDENT_AMBULATORY_CARE_PROVIDER_SITE_OTHER)
Admission: RE | Admit: 2011-04-05 | Discharge: 2011-04-05 | Disposition: A | Discharge status: Home / Self Care | Payer: BC Managed Care – PPO | Source: Ambulatory Visit | Attending: Internal Medicine | Admitting: Internal Medicine

## 2011-04-05 DIAGNOSIS — K5090 Crohn's disease, unspecified, without complications: Secondary | ICD-10-CM

## 2011-04-05 MED ORDER — IOHEXOL 300 MG/ML  SOLN
100.0000 mL | Freq: Once | INTRAMUSCULAR | Status: AC | PRN
  Administered 2011-04-05: 100 mL via INTRAVENOUS

## 2011-04-06 NOTE — Progress Notes (Signed)
Quick Note:  I left him a message that results are in and we would call back   Please try to reach him again tomorrow  I want to tell him that studies suggest there is some Crohn's disease that is active - not much but some  I think it would be appropriate to take budesonide 3 mg caps 3 a day (9 mg total) daily for 6-8 weeks and see what that does - if he accepts Rx that for 1 month with a refill and set up 6-8 week follow-up at which point will see if he feels better ______

## 2011-04-10 ENCOUNTER — Other Ambulatory Visit: Payer: Self-pay

## 2011-04-10 MED ORDER — BUDESONIDE 3 MG PO CP24: 9.0000 mg | ORAL_CAPSULE | Freq: Every day | ORAL | Status: AC

## 2011-04-24 ENCOUNTER — Telehealth: Payer: Self-pay | Admitting: Internal Medicine

## 2011-04-25 NOTE — Telephone Encounter (Signed)
Patient reports he started on Entocort 04/10/11.  He has been taking 27 mg a day instead of the ordered 9 mg a day.  He is almost out out the RX.  I discussed with Nadara Mustard pharmacist at [facility].  Patient should be weaned to desired dose of 9 mg.  Discussed with Dr Leone Payor the patient should take 18 mg for 3 days then 9 mg daily.  I have called [facility] and the pharmacy will try and get him an override from his insurance.  I have left him a message to call me back to confirm he got my message.

## 2011-04-25 NOTE — Telephone Encounter (Signed)
Patient advised.  He will call back for any further questions or problems.

## 2011-08-18 ENCOUNTER — Telehealth: Payer: Self-pay | Admitting: Internal Medicine

## 2011-08-18 NOTE — Telephone Encounter (Signed)
Pt had question about labs done on 06-21-10. Pt states that result he received indication that he was waiting on advanced cholesterol test results which where pending at the time that he has still not received them.  Advise Pt that it was a [facility] that was done. Pt then states he has a copy of those in his hand so no need to send lab results.

## 2011-08-18 NOTE — Telephone Encounter (Signed)
Patient would like his 06/21/2009 labs results emailed to him @ [email or web address]  Thanks

## 2011-08-31 ENCOUNTER — Encounter: Payer: Self-pay | Admitting: Internal Medicine

## 2011-08-31 ENCOUNTER — Ambulatory Visit (INDEPENDENT_AMBULATORY_CARE_PROVIDER_SITE_OTHER): Payer: BC Managed Care – PPO | Admitting: Internal Medicine

## 2011-08-31 VITALS — HR 79 | Temp 98.4°F | Ht 73.03 in | Wt 267.0 lb

## 2011-08-31 DIAGNOSIS — R9431 Abnormal electrocardiogram [ECG] [EKG]: Secondary | ICD-10-CM | POA: Insufficient documentation

## 2011-08-31 DIAGNOSIS — Z0000 Encounter for general adult medical examination without abnormal findings: Secondary | ICD-10-CM

## 2011-08-31 LAB — HEPATIC FUNCTION PANEL
ALT: 33 U/L (ref 0–53)
Albumin: 4 g/dL (ref 3.5–5.2)
Total Protein: 6.9 g/dL (ref 6.0–8.3)

## 2011-08-31 LAB — CBC WITH DIFFERENTIAL/PLATELET
Basophils Absolute: 0 10*3/uL (ref 0.0–0.1)
Basophils Relative: 0.4 % (ref 0.0–3.0)
Eosinophils Absolute: 0.2 10*3/uL (ref 0.0–0.7)
HCT: 47.6 % (ref 39.0–52.0)
Hemoglobin: 15.9 g/dL (ref 13.0–17.0)
Lymphocytes Relative: 22.9 % (ref 12.0–46.0)
Lymphs Abs: 1.3 10*3/uL (ref 0.7–4.0)
MCHC: 33.5 g/dL (ref 30.0–36.0)
MCV: 87 fl (ref 78.0–100.0)
Monocytes Absolute: 0.4 10*3/uL (ref 0.1–1.0)
Neutro Abs: 3.7 10*3/uL (ref 1.4–7.7)
RBC: 5.48 Mil/uL (ref 4.22–5.81)
RDW: 13.4 % (ref 11.5–14.6)

## 2011-08-31 LAB — BASIC METABOLIC PANEL
CO2: 26 mEq/L (ref 19–32)
Calcium: 9.3 mg/dL (ref 8.4–10.5)
Chloride: 101 mEq/L (ref 96–112)
Glucose, Bld: 104 mg/dL — ABNORMAL HIGH (ref 70–99)
Sodium: 137 mEq/L (ref 135–145)

## 2011-08-31 LAB — LIPID PANEL
Cholesterol: 180 mg/dL (ref 0–200)
Triglycerides: 239 mg/dL — ABNORMAL HIGH (ref 0.0–149.0)

## 2011-08-31 NOTE — Assessment & Plan Note (Signed)
Confirmatory studies will be scheduled at [facility]

## 2011-08-31 NOTE — Progress Notes (Signed)
  Subjective:    Patient ID: Kyle Ball, male    DOB: April 24, 1946, 66 y.o.   MRN: [id number]  HPI Kyle Ball  is here for a physical;acute issues include recent  knee arthralgia flare      Review of Systems He has been evaluated at [facility]; confirmatory test for Parkinson's are to be scheduled once he has insurance clearance. He has been having pain in the right knee; this is the same leg in which he had quadriceps rupture playing football in high school. This has limited his exercise. It  does respond to Tylenol as needed. He is on Weight Watchers dietary program, modified. He does take a statin. He denies significant myalgias or GI problems. He states his appetite has been increased by Requip prescribed by Dr. Krista Blue ,Neurologist seen prior to [facility] assessment     Objective:   Physical Exam Gen.: Healthy and well-nourished in appearance. Alert, appropriate and cooperative throughout exam. Head: Normocephalic without obvious abnormalities;no alopecia  Eyes: No corneal or conjunctival inflammation noted. Pupils equal round reactive to light and accommodation. Fundal exam is benign without hemorrhages, exudate, papilledema. Extraocular motion intact. Vision grossly normal.Slight ptosis Ears: External  ear exam reveals no significant lesions or deformities. Canals clear .TMs normal. Hearing is grossly normal bilaterally. Nose: External nasal exam reveals no deformity or inflammation. Nasal mucosa are pink and moist. No lesions or exudates noted.  Mouth: Oral mucosa and oropharynx reveal no lesions or exudates. Teeth in good repair. Neck: No deformities, masses, or tenderness noted. Range of motion & Thyroid normal Lungs: Normal respiratory effort; chest expands symmetrically. Lungs are clear to auscultation without rales, wheezes, or increased work of breathing. Heart: Normal rate and rhythm. Normal S1 and S2. No gallop, click, or rub. S 4 w/o murmur. Abdomen: Bowel sounds normal; abdomen  soft and nontender. No masses or organomegaly . Ventral hernia & mid line op scar noted. Genitalia:Dr Grapey                                    Musculoskeletal/extremities: No deformity or scoliosis noted of  the thoracic or lumbar spine. No clubbing, cyanosis,or  edema noted. Range of motion  normal .Tone & strength  normal. Diffuse lung changes with marked crepitus in the right knee; no obvious effusion. Nail health  good. Vascular: Carotid, radial artery, dorsalis pedis and  posterior tibial pulses are full and equal. No bruits present. Neurologic: Alert and oriented x3. Deep tendon reflexes symmetrical and normal.          Skin: Intact without suspicious lesions or rashes. Lymph: No cervical, axillary lymphadenopathy present. Psych: Mood and affect are normal. Normally interactive                                                                                         Assessment & Plan:  #1 comprehensive physical exam; no acute findings #2 see Problem List with Assessments & Recommendations Plan: see Orders

## 2011-08-31 NOTE — Patient Instructions (Signed)
Preventive Health Care: Exercise at least 30-45 minutes a day,  3-4 days a week.  Eat a low-fat diet with lots of fruits and vegetables, up to 7-9 servings per day. Avoid obesity; your goal is waist measurement < 40 inches.Consume less than 40 grams of sugar per day from foods & drinks with High Fructose Corn Sugar as # 1,2,3 or # 4 on label. Consider glucosamine sulfate 1500 mg daily for joint symptoms. Take this daily  for 3 months and then leave it off for 2 months. This will rehydrate the cartilages.

## 2012-02-01 ENCOUNTER — Other Ambulatory Visit: Payer: BC Managed Care – PPO

## 2012-02-05 ENCOUNTER — Other Ambulatory Visit: Payer: Self-pay | Admitting: Internal Medicine

## 2012-03-12 ENCOUNTER — Other Ambulatory Visit: Payer: Self-pay | Admitting: Internal Medicine

## 2012-07-27 ENCOUNTER — Other Ambulatory Visit: Payer: Self-pay

## 2012-08-12 ENCOUNTER — Ambulatory Visit: Payer: BC Managed Care – PPO | Attending: Neurology | Admitting: Physical Therapy

## 2012-08-12 DIAGNOSIS — R471 Dysarthria and anarthria: Secondary | ICD-10-CM | POA: Insufficient documentation

## 2012-08-12 DIAGNOSIS — Z5189 Encounter for other specified aftercare: Secondary | ICD-10-CM | POA: Insufficient documentation

## 2012-08-12 DIAGNOSIS — R269 Unspecified abnormalities of gait and mobility: Secondary | ICD-10-CM | POA: Insufficient documentation

## 2012-08-13 ENCOUNTER — Ambulatory Visit: Payer: BC Managed Care – PPO

## 2012-08-20 ENCOUNTER — Ambulatory Visit: Payer: BC Managed Care – PPO | Admitting: Occupational Therapy

## 2012-08-20 ENCOUNTER — Encounter: Payer: BC Managed Care – PPO | Admitting: Occupational Therapy

## 2012-08-20 ENCOUNTER — Ambulatory Visit: Payer: BC Managed Care – PPO | Admitting: Physical Therapy

## 2012-08-22 ENCOUNTER — Encounter: Payer: BC Managed Care – PPO | Admitting: Occupational Therapy

## 2012-08-26 ENCOUNTER — Ambulatory Visit: Payer: BC Managed Care – PPO

## 2012-08-27 ENCOUNTER — Ambulatory Visit: Payer: BC Managed Care – PPO | Admitting: Physical Therapy

## 2012-09-02 ENCOUNTER — Ambulatory Visit: Payer: BC Managed Care – PPO | Admitting: Physical Therapy

## 2012-09-10 ENCOUNTER — Ambulatory Visit: Payer: BC Managed Care – PPO

## 2012-09-11 ENCOUNTER — Ambulatory Visit: Payer: BC Managed Care – PPO | Admitting: Physical Therapy

## 2012-09-11 ENCOUNTER — Ambulatory Visit: Payer: BC Managed Care – PPO | Attending: Internal Medicine | Admitting: Occupational Therapy

## 2012-09-11 DIAGNOSIS — M2420 Disorder of ligament, unspecified site: Secondary | ICD-10-CM | POA: Insufficient documentation

## 2012-09-11 DIAGNOSIS — Z5189 Encounter for other specified aftercare: Secondary | ICD-10-CM | POA: Insufficient documentation

## 2012-09-11 DIAGNOSIS — M629 Disorder of muscle, unspecified: Secondary | ICD-10-CM | POA: Insufficient documentation

## 2012-09-11 DIAGNOSIS — R279 Unspecified lack of coordination: Secondary | ICD-10-CM | POA: Insufficient documentation

## 2012-09-11 DIAGNOSIS — R471 Dysarthria and anarthria: Secondary | ICD-10-CM | POA: Insufficient documentation

## 2012-09-11 DIAGNOSIS — R5381 Other malaise: Secondary | ICD-10-CM | POA: Insufficient documentation

## 2012-09-11 DIAGNOSIS — R269 Unspecified abnormalities of gait and mobility: Secondary | ICD-10-CM | POA: Insufficient documentation

## 2012-09-16 ENCOUNTER — Ambulatory Visit: Payer: BC Managed Care – PPO | Admitting: Occupational Therapy

## 2012-09-16 ENCOUNTER — Ambulatory Visit: Payer: BC Managed Care – PPO | Admitting: Physical Therapy

## 2012-09-17 ENCOUNTER — Ambulatory Visit: Payer: BC Managed Care – PPO | Admitting: Occupational Therapy

## 2012-09-18 ENCOUNTER — Ambulatory Visit: Payer: BC Managed Care – PPO | Admitting: Physical Therapy

## 2012-09-18 ENCOUNTER — Ambulatory Visit: Payer: BC Managed Care – PPO

## 2012-09-23 ENCOUNTER — Ambulatory Visit: Payer: BC Managed Care – PPO | Admitting: Occupational Therapy

## 2012-09-23 ENCOUNTER — Ambulatory Visit: Payer: BC Managed Care – PPO | Admitting: Physical Therapy

## 2012-09-24 ENCOUNTER — Ambulatory Visit: Payer: BC Managed Care – PPO | Admitting: Occupational Therapy

## 2012-09-26 ENCOUNTER — Ambulatory Visit: Payer: BC Managed Care – PPO

## 2012-09-26 ENCOUNTER — Ambulatory Visit: Payer: BC Managed Care – PPO | Admitting: Physical Therapy

## 2012-09-30 ENCOUNTER — Ambulatory Visit: Payer: BC Managed Care – PPO | Admitting: Physical Therapy

## 2012-09-30 ENCOUNTER — Ambulatory Visit: Payer: BC Managed Care – PPO | Admitting: Occupational Therapy

## 2012-10-02 ENCOUNTER — Ambulatory Visit: Payer: BC Managed Care – PPO | Admitting: Occupational Therapy

## 2012-10-02 ENCOUNTER — Ambulatory Visit: Payer: BC Managed Care – PPO

## 2012-10-03 ENCOUNTER — Ambulatory Visit: Payer: BC Managed Care – PPO | Admitting: Physical Therapy

## 2012-10-07 ENCOUNTER — Encounter: Payer: BC Managed Care – PPO | Admitting: *Deleted

## 2012-10-07 ENCOUNTER — Ambulatory Visit: Payer: BC Managed Care – PPO | Admitting: Physical Therapy

## 2012-10-08 ENCOUNTER — Ambulatory Visit: Payer: BC Managed Care – PPO | Admitting: Physical Therapy

## 2012-10-09 ENCOUNTER — Encounter: Payer: BC Managed Care – PPO | Admitting: Occupational Therapy

## 2012-10-10 ENCOUNTER — Encounter: Payer: BC Managed Care – PPO | Admitting: Occupational Therapy

## 2012-10-11 ENCOUNTER — Ambulatory Visit: Payer: BC Managed Care – PPO

## 2012-10-11 ENCOUNTER — Ambulatory Visit: Payer: BC Managed Care – PPO | Attending: Internal Medicine | Admitting: Occupational Therapy

## 2012-10-11 DIAGNOSIS — R5381 Other malaise: Secondary | ICD-10-CM | POA: Insufficient documentation

## 2012-10-11 DIAGNOSIS — R269 Unspecified abnormalities of gait and mobility: Secondary | ICD-10-CM | POA: Insufficient documentation

## 2012-10-11 DIAGNOSIS — R471 Dysarthria and anarthria: Secondary | ICD-10-CM | POA: Insufficient documentation

## 2012-10-11 DIAGNOSIS — R279 Unspecified lack of coordination: Secondary | ICD-10-CM | POA: Insufficient documentation

## 2012-10-11 DIAGNOSIS — M629 Disorder of muscle, unspecified: Secondary | ICD-10-CM | POA: Insufficient documentation

## 2012-10-11 DIAGNOSIS — Z5189 Encounter for other specified aftercare: Secondary | ICD-10-CM | POA: Insufficient documentation

## 2012-10-11 DIAGNOSIS — M2420 Disorder of ligament, unspecified site: Secondary | ICD-10-CM | POA: Insufficient documentation

## 2012-10-14 ENCOUNTER — Ambulatory Visit: Payer: BC Managed Care – PPO | Admitting: Occupational Therapy

## 2012-10-16 ENCOUNTER — Ambulatory Visit: Payer: BC Managed Care – PPO

## 2012-10-16 ENCOUNTER — Encounter: Payer: BC Managed Care – PPO | Admitting: Occupational Therapy

## 2012-10-22 ENCOUNTER — Encounter: Payer: BC Managed Care – PPO | Admitting: Occupational Therapy

## 2012-10-22 ENCOUNTER — Ambulatory Visit: Payer: BC Managed Care – PPO

## 2013-01-02 DIAGNOSIS — F331 Major depressive disorder, recurrent, moderate: Secondary | ICD-10-CM | POA: Diagnosis not present

## 2013-01-02 DIAGNOSIS — F988 Other specified behavioral and emotional disorders with onset usually occurring in childhood and adolescence: Secondary | ICD-10-CM | POA: Diagnosis not present

## 2013-01-10 ENCOUNTER — Encounter: Payer: BC Managed Care – PPO | Admitting: Internal Medicine

## 2013-01-16 DIAGNOSIS — H40019 Open angle with borderline findings, low risk, unspecified eye: Secondary | ICD-10-CM | POA: Diagnosis not present

## 2013-01-28 DIAGNOSIS — N400 Enlarged prostate without lower urinary tract symptoms: Secondary | ICD-10-CM | POA: Diagnosis not present

## 2013-01-28 DIAGNOSIS — N529 Male erectile dysfunction, unspecified: Secondary | ICD-10-CM | POA: Diagnosis not present

## 2013-01-28 DIAGNOSIS — F438 Other reactions to severe stress: Secondary | ICD-10-CM | POA: Diagnosis not present

## 2013-01-28 DIAGNOSIS — R635 Abnormal weight gain: Secondary | ICD-10-CM | POA: Diagnosis not present

## 2013-01-28 DIAGNOSIS — G20 Parkinson's disease: Secondary | ICD-10-CM | POA: Diagnosis not present

## 2013-01-28 DIAGNOSIS — Z7982 Long term (current) use of aspirin: Secondary | ICD-10-CM | POA: Diagnosis not present

## 2013-01-28 DIAGNOSIS — F329 Major depressive disorder, single episode, unspecified: Secondary | ICD-10-CM | POA: Diagnosis not present

## 2013-03-05 ENCOUNTER — Ambulatory Visit (INDEPENDENT_AMBULATORY_CARE_PROVIDER_SITE_OTHER): Payer: Medicare Other | Admitting: Internal Medicine

## 2013-03-05 ENCOUNTER — Encounter: Payer: Self-pay | Admitting: Internal Medicine

## 2013-03-05 VITALS — BP 160/83 | HR 80 | Temp 98.4°F | Resp 12 | Ht 73.0 in | Wt 277.8 lb

## 2013-03-05 DIAGNOSIS — R030 Elevated blood-pressure reading, without diagnosis of hypertension: Secondary | ICD-10-CM | POA: Insufficient documentation

## 2013-03-05 DIAGNOSIS — K5090 Crohn's disease, unspecified, without complications: Secondary | ICD-10-CM

## 2013-03-05 DIAGNOSIS — Z87898 Personal history of other specified conditions: Secondary | ICD-10-CM | POA: Diagnosis not present

## 2013-03-05 DIAGNOSIS — E782 Mixed hyperlipidemia: Secondary | ICD-10-CM | POA: Diagnosis not present

## 2013-03-05 DIAGNOSIS — M25469 Effusion, unspecified knee: Secondary | ICD-10-CM

## 2013-03-05 DIAGNOSIS — M25461 Effusion, right knee: Secondary | ICD-10-CM

## 2013-03-05 DIAGNOSIS — Z0000 Encounter for general adult medical examination without abnormal findings: Secondary | ICD-10-CM | POA: Diagnosis not present

## 2013-03-05 NOTE — Progress Notes (Signed)
Subjective:    Patient ID: Kyle Ball, male    DOB: 08-Jun-1946, 67 y.o.   MRN: [id number]  HPI Medicare Wellness Visit:  Psychosocial & medical history were reviewed as required by Medicare (abuse,antisocial behavioral risks,firearm risk).  Social history: caffeine: 1.5 cups coffee/day , alcohol:  14 drinks / week  ,  tobacco use:  never Exercise :  See below Home & personal  safety / fall risk: no ; S/P PT/OT Limitations of activities of daily living:no Seatbelt  and smoke alarm use:yes Power of Attorney/Living Will status : in place Ophthalmology exam status : current Hearing evaluation status: not current Orientation :oriented X 3 Memory & recall :excellent Math testing:good Active depression / anxiety:only in context of Parkinson's Foreign travel history : 2008 [place] Immunization status for Shingles /Flu/ PNA/ tetanus : Shingles; Flu needed Transfusion history:  ? With Crohn's surgery 1994 Preventive health surveillance status of colonoscopy as per protocol/ SOC: colonoscopy due Dental care:  Every 6 months Chart reviewed &  Updated. Active issues reviewed & addressed.      Review of Systems He is on a heart healthy diet; he exercises infrequently . Specifically he denies chest pain, palpitations, dyspnea, or claudication.  Family history is negative for premature coronary disease. Advanced cholesterol testing reveals his LDL goal is less than 100. There is medication compliance with the statin. Despite PMH of Crohn's significant abdominal symptoms, memory deficit, or myalgias denied. BP has been elevated @ MD visits. PMH of quadriceps repair remotely ; pain & stiffness R knee X 1 week w/o injury or trigger.     Objective:   Physical Exam Gen.:  well-nourished in appearance. Alert, appropriate and cooperative throughout exam. Head: Normocephalic without obvious abnormalities  Eyes: No corneal or conjunctival inflammation noted. Pupils equal round reactive to light  and accommodation.  Extraocular motion intact. Vision grossly normal without lenses Ears: External  ear exam reveals no significant lesions or deformities. Canals clear .TMs normal. Hearing is grossly normal bilaterally. Nose: External nasal exam reveals no deformity or inflammation. Nasal mucosa are pink and moist. No lesions or exudates noted.  Mouth: Oral mucosa and oropharynx reveal no lesions or exudates. Teeth in good repair. Decreased R nasolabial fold Neck: No deformities, masses, or tenderness noted. Range of motion & Thyroid normal. Lungs: Normal respiratory effort; chest expands symmetrically. Lungs are clear to auscultation without rales, wheezes, or increased work of breathing. Heart: Normal rate and rhythm. Normal S1 and S2. No gallop, click, or rub. S4 w/o murmur. Abdomen: Bowel sounds normal; abdomen soft and nontender. No masses or  organomegaly .Ventral surgical site hernia noted. Genitalia: As per Urology                                  Musculoskeletal/extremities: No deformity or scoliosis noted of  the thoracic or lumbar spine.  No clubbing, cyanosis or significant extremity  deformity noted. Range of motion normal .Tone & strength  Normal.Trace ankle edema. Joints normal . Nail health good. There is significant effusion of the right knee. Able to lie down & sit up w/o help. Negative SLR bilaterally Vascular: Carotid, radial artery, dorsalis pedis and  posterior tibial pulses are full and equal. No bruits present. Neurologic: Alert and oriented x3. Deep tendon reflexes symmetrical and normal. Right knee reflex not checked due to effusion       Skin: Intact without suspicious lesions or rashes. Lymph: No cervical,  axillary lymphadenopathy present. Psych: Mood and affect are normal. Normally interactive                                                                                        Assessment & Plan:  #1 Medicare Wellness Exam; criteria met ; data entered #2  Problem List/Diagnoses reviewed #3 R patellar effusion Plan:  Assessments made/ Orders entered

## 2013-03-05 NOTE — Patient Instructions (Addendum)
Order for fasting labs entered into  the computer; these will be performed here.Please schedule appointment. Minimal Blood Pressure Goal= AVERAGE < 140/90;  Ideal is an AVERAGE < 135/85. This AVERAGE should be calculated from @ least 5-7 BP readings taken @ different times of day on different days of week. You should not respond to isolated BP readings , but rather the AVERAGE for that week .Please bring your  blood pressure cuff to office visits to verify that it is reliable.It  can also be checked against the blood pressure device at the pharmacy. Finger or wrist cuffs are not dependable; an arm cuff is. Take the EKG to any emergency room or preop visits. There are nonspecific changes; as long as there is no new change these are not clinically significant . If the old EKG is not available for comparison; it may result in unnecessary hospitalization for observation with significant unnecessary expense.

## 2013-03-11 DIAGNOSIS — M25569 Pain in unspecified knee: Secondary | ICD-10-CM | POA: Diagnosis not present

## 2013-03-20 ENCOUNTER — Encounter: Payer: Self-pay | Admitting: Internal Medicine

## 2013-03-20 NOTE — Telephone Encounter (Signed)
Pt last CPE was 03-05-13.  Please advise.//AB/CMA

## 2013-03-26 ENCOUNTER — Ambulatory Visit: Payer: Medicare Other | Admitting: Physical Therapy

## 2013-03-26 ENCOUNTER — Ambulatory Visit: Payer: BC Managed Care – PPO

## 2013-03-26 ENCOUNTER — Ambulatory Visit: Payer: BC Managed Care – PPO | Admitting: Occupational Therapy

## 2013-04-14 DIAGNOSIS — M25569 Pain in unspecified knee: Secondary | ICD-10-CM | POA: Diagnosis not present

## 2013-04-17 ENCOUNTER — Ambulatory Visit: Payer: Medicare Other | Admitting: Physical Therapy

## 2013-04-17 ENCOUNTER — Ambulatory Visit: Payer: Medicare Other

## 2013-04-17 ENCOUNTER — Ambulatory Visit: Payer: Medicare Other | Attending: Internal Medicine | Admitting: Occupational Therapy

## 2013-04-17 ENCOUNTER — Other Ambulatory Visit: Payer: Self-pay

## 2013-04-17 DIAGNOSIS — Z5189 Encounter for other specified aftercare: Secondary | ICD-10-CM | POA: Insufficient documentation

## 2013-04-17 DIAGNOSIS — M629 Disorder of muscle, unspecified: Secondary | ICD-10-CM | POA: Insufficient documentation

## 2013-04-17 DIAGNOSIS — R269 Unspecified abnormalities of gait and mobility: Secondary | ICD-10-CM | POA: Insufficient documentation

## 2013-04-17 DIAGNOSIS — R279 Unspecified lack of coordination: Secondary | ICD-10-CM | POA: Insufficient documentation

## 2013-04-17 DIAGNOSIS — R471 Dysarthria and anarthria: Secondary | ICD-10-CM | POA: Insufficient documentation

## 2013-04-17 DIAGNOSIS — M2420 Disorder of ligament, unspecified site: Secondary | ICD-10-CM | POA: Insufficient documentation

## 2013-04-17 DIAGNOSIS — R5381 Other malaise: Secondary | ICD-10-CM | POA: Insufficient documentation

## 2013-04-28 DIAGNOSIS — R972 Elevated prostate specific antigen [PSA]: Secondary | ICD-10-CM | POA: Diagnosis not present

## 2013-04-28 DIAGNOSIS — N139 Obstructive and reflux uropathy, unspecified: Secondary | ICD-10-CM | POA: Diagnosis not present

## 2013-04-28 DIAGNOSIS — N529 Male erectile dysfunction, unspecified: Secondary | ICD-10-CM | POA: Diagnosis not present

## 2013-04-28 DIAGNOSIS — N401 Enlarged prostate with lower urinary tract symptoms: Secondary | ICD-10-CM | POA: Diagnosis not present

## 2013-04-29 DIAGNOSIS — F988 Other specified behavioral and emotional disorders with onset usually occurring in childhood and adolescence: Secondary | ICD-10-CM | POA: Diagnosis not present

## 2013-04-29 DIAGNOSIS — IMO0002 Reserved for concepts with insufficient information to code with codable children: Secondary | ICD-10-CM | POA: Diagnosis not present

## 2013-04-30 DIAGNOSIS — H40019 Open angle with borderline findings, low risk, unspecified eye: Secondary | ICD-10-CM | POA: Diagnosis not present

## 2013-04-30 DIAGNOSIS — H2510 Age-related nuclear cataract, unspecified eye: Secondary | ICD-10-CM | POA: Diagnosis not present

## 2013-05-13 ENCOUNTER — Ambulatory Visit: Payer: Medicare Other | Admitting: Occupational Therapy

## 2013-05-13 ENCOUNTER — Ambulatory Visit: Payer: Medicare Other | Attending: Neurology | Admitting: Physical Therapy

## 2013-05-13 ENCOUNTER — Ambulatory Visit: Payer: Medicare Other

## 2013-05-13 DIAGNOSIS — R5381 Other malaise: Secondary | ICD-10-CM | POA: Diagnosis not present

## 2013-05-13 DIAGNOSIS — M629 Disorder of muscle, unspecified: Secondary | ICD-10-CM | POA: Diagnosis not present

## 2013-05-13 DIAGNOSIS — Z5189 Encounter for other specified aftercare: Secondary | ICD-10-CM | POA: Diagnosis not present

## 2013-05-13 DIAGNOSIS — R269 Unspecified abnormalities of gait and mobility: Secondary | ICD-10-CM | POA: Diagnosis not present

## 2013-05-13 DIAGNOSIS — R279 Unspecified lack of coordination: Secondary | ICD-10-CM | POA: Diagnosis not present

## 2013-05-13 DIAGNOSIS — M2420 Disorder of ligament, unspecified site: Secondary | ICD-10-CM | POA: Insufficient documentation

## 2013-05-13 DIAGNOSIS — R471 Dysarthria and anarthria: Secondary | ICD-10-CM | POA: Diagnosis not present

## 2013-05-15 ENCOUNTER — Ambulatory Visit: Payer: Medicare Other | Admitting: Physical Therapy

## 2013-05-15 ENCOUNTER — Ambulatory Visit: Payer: Medicare Other | Admitting: Occupational Therapy

## 2013-05-16 ENCOUNTER — Ambulatory Visit: Payer: Medicare Other

## 2013-05-21 DIAGNOSIS — N529 Male erectile dysfunction, unspecified: Secondary | ICD-10-CM | POA: Diagnosis not present

## 2013-05-21 DIAGNOSIS — G20 Parkinson's disease: Secondary | ICD-10-CM | POA: Diagnosis not present

## 2013-05-21 DIAGNOSIS — Z7982 Long term (current) use of aspirin: Secondary | ICD-10-CM | POA: Diagnosis not present

## 2013-05-21 DIAGNOSIS — F329 Major depressive disorder, single episode, unspecified: Secondary | ICD-10-CM | POA: Diagnosis not present

## 2013-05-21 DIAGNOSIS — N400 Enlarged prostate without lower urinary tract symptoms: Secondary | ICD-10-CM | POA: Diagnosis not present

## 2013-05-22 DIAGNOSIS — M1710 Unilateral primary osteoarthritis, unspecified knee: Secondary | ICD-10-CM | POA: Diagnosis not present

## 2013-05-27 ENCOUNTER — Ambulatory Visit: Payer: Medicare Other | Admitting: Physical Therapy

## 2013-05-27 ENCOUNTER — Ambulatory Visit: Payer: Medicare Other

## 2013-05-27 ENCOUNTER — Ambulatory Visit: Payer: Medicare Other | Admitting: Occupational Therapy

## 2013-05-29 ENCOUNTER — Ambulatory Visit: Payer: Medicare Other | Admitting: Physical Therapy

## 2013-05-29 ENCOUNTER — Ambulatory Visit: Payer: Medicare Other

## 2013-05-29 ENCOUNTER — Ambulatory Visit: Payer: Medicare Other | Admitting: Occupational Therapy

## 2013-06-10 ENCOUNTER — Ambulatory Visit: Payer: Medicare Other | Admitting: Occupational Therapy

## 2013-06-10 ENCOUNTER — Ambulatory Visit: Payer: Medicare Other

## 2013-06-10 ENCOUNTER — Ambulatory Visit: Payer: Medicare Other | Admitting: Physical Therapy

## 2013-06-12 HISTORY — PX: KNEE ARTHROSCOPY: SUR90

## 2013-06-12 HISTORY — PX: REFRACTIVE SURGERY: SHX103

## 2013-06-13 ENCOUNTER — Ambulatory Visit: Payer: Medicare Other

## 2013-06-13 ENCOUNTER — Ambulatory Visit: Payer: Medicare Other | Admitting: Physical Therapy

## 2013-06-16 ENCOUNTER — Ambulatory Visit: Payer: Medicare Other | Admitting: Occupational Therapy

## 2013-06-16 ENCOUNTER — Ambulatory Visit: Payer: Medicare Other

## 2013-06-16 ENCOUNTER — Ambulatory Visit: Payer: Medicare Other | Attending: Neurology | Admitting: Physical Therapy

## 2013-06-16 DIAGNOSIS — R269 Unspecified abnormalities of gait and mobility: Secondary | ICD-10-CM | POA: Insufficient documentation

## 2013-06-16 DIAGNOSIS — Z5189 Encounter for other specified aftercare: Secondary | ICD-10-CM | POA: Insufficient documentation

## 2013-06-16 DIAGNOSIS — M629 Disorder of muscle, unspecified: Secondary | ICD-10-CM | POA: Diagnosis not present

## 2013-06-16 DIAGNOSIS — R471 Dysarthria and anarthria: Secondary | ICD-10-CM | POA: Diagnosis not present

## 2013-06-16 DIAGNOSIS — R5381 Other malaise: Secondary | ICD-10-CM | POA: Diagnosis not present

## 2013-06-16 DIAGNOSIS — M2420 Disorder of ligament, unspecified site: Secondary | ICD-10-CM | POA: Diagnosis not present

## 2013-06-16 DIAGNOSIS — R279 Unspecified lack of coordination: Secondary | ICD-10-CM | POA: Insufficient documentation

## 2013-06-18 ENCOUNTER — Ambulatory Visit: Payer: Medicare Other | Admitting: Physical Therapy

## 2013-06-18 DIAGNOSIS — S83289A Other tear of lateral meniscus, current injury, unspecified knee, initial encounter: Secondary | ICD-10-CM | POA: Diagnosis not present

## 2013-06-19 ENCOUNTER — Ambulatory Visit: Payer: Medicare Other

## 2013-06-19 ENCOUNTER — Ambulatory Visit: Payer: Medicare Other | Admitting: Physical Therapy

## 2013-06-19 ENCOUNTER — Ambulatory Visit: Payer: Medicare Other | Admitting: Occupational Therapy

## 2013-06-19 DIAGNOSIS — Z5189 Encounter for other specified aftercare: Secondary | ICD-10-CM | POA: Diagnosis not present

## 2013-06-19 DIAGNOSIS — R471 Dysarthria and anarthria: Secondary | ICD-10-CM | POA: Diagnosis not present

## 2013-06-19 DIAGNOSIS — R279 Unspecified lack of coordination: Secondary | ICD-10-CM | POA: Diagnosis not present

## 2013-06-19 DIAGNOSIS — M629 Disorder of muscle, unspecified: Secondary | ICD-10-CM | POA: Diagnosis not present

## 2013-06-19 DIAGNOSIS — M2420 Disorder of ligament, unspecified site: Secondary | ICD-10-CM | POA: Diagnosis not present

## 2013-06-19 DIAGNOSIS — R269 Unspecified abnormalities of gait and mobility: Secondary | ICD-10-CM | POA: Diagnosis not present

## 2013-06-19 DIAGNOSIS — R5381 Other malaise: Secondary | ICD-10-CM | POA: Diagnosis not present

## 2013-06-20 DIAGNOSIS — M25569 Pain in unspecified knee: Secondary | ICD-10-CM | POA: Diagnosis not present

## 2013-06-24 ENCOUNTER — Ambulatory Visit: Payer: Medicare Other

## 2013-06-24 ENCOUNTER — Ambulatory Visit: Payer: Medicare Other | Admitting: Physical Therapy

## 2013-06-24 DIAGNOSIS — R471 Dysarthria and anarthria: Secondary | ICD-10-CM | POA: Diagnosis not present

## 2013-06-24 DIAGNOSIS — R5381 Other malaise: Secondary | ICD-10-CM | POA: Diagnosis not present

## 2013-06-24 DIAGNOSIS — R279 Unspecified lack of coordination: Secondary | ICD-10-CM | POA: Diagnosis not present

## 2013-06-24 DIAGNOSIS — R269 Unspecified abnormalities of gait and mobility: Secondary | ICD-10-CM | POA: Diagnosis not present

## 2013-06-24 DIAGNOSIS — Z5189 Encounter for other specified aftercare: Secondary | ICD-10-CM | POA: Diagnosis not present

## 2013-06-24 DIAGNOSIS — M629 Disorder of muscle, unspecified: Secondary | ICD-10-CM | POA: Diagnosis not present

## 2013-07-03 DIAGNOSIS — M659 Synovitis and tenosynovitis, unspecified: Secondary | ICD-10-CM | POA: Diagnosis not present

## 2013-07-03 DIAGNOSIS — M23302 Other meniscus derangements, unspecified lateral meniscus, unspecified knee: Secondary | ICD-10-CM | POA: Diagnosis not present

## 2013-07-03 DIAGNOSIS — M2240 Chondromalacia patellae, unspecified knee: Secondary | ICD-10-CM | POA: Diagnosis not present

## 2013-07-03 DIAGNOSIS — G8918 Other acute postprocedural pain: Secondary | ICD-10-CM | POA: Diagnosis not present

## 2013-07-03 DIAGNOSIS — M1710 Unilateral primary osteoarthritis, unspecified knee: Secondary | ICD-10-CM | POA: Diagnosis not present

## 2013-07-03 DIAGNOSIS — M9420 Chondromalacia, unspecified site: Secondary | ICD-10-CM | POA: Diagnosis not present

## 2013-07-03 DIAGNOSIS — M1120 Other chondrocalcinosis, unspecified site: Secondary | ICD-10-CM | POA: Diagnosis not present

## 2013-07-03 DIAGNOSIS — M23359 Other meniscus derangements, posterior horn of lateral meniscus, unspecified knee: Secondary | ICD-10-CM | POA: Diagnosis not present

## 2013-07-07 ENCOUNTER — Encounter: Payer: Self-pay | Admitting: Internal Medicine

## 2013-10-28 DIAGNOSIS — F331 Major depressive disorder, recurrent, moderate: Secondary | ICD-10-CM | POA: Diagnosis not present

## 2013-11-13 DIAGNOSIS — H2510 Age-related nuclear cataract, unspecified eye: Secondary | ICD-10-CM | POA: Diagnosis not present

## 2013-11-13 DIAGNOSIS — H35379 Puckering of macula, unspecified eye: Secondary | ICD-10-CM | POA: Diagnosis not present

## 2013-11-13 DIAGNOSIS — H353190 Nonexudative age-related macular degeneration, unspecified eye, stage unspecified: Secondary | ICD-10-CM | POA: Diagnosis not present

## 2013-12-01 DIAGNOSIS — H2510 Age-related nuclear cataract, unspecified eye: Secondary | ICD-10-CM | POA: Diagnosis not present

## 2013-12-01 DIAGNOSIS — H43819 Vitreous degeneration, unspecified eye: Secondary | ICD-10-CM | POA: Diagnosis not present

## 2013-12-01 DIAGNOSIS — H35379 Puckering of macula, unspecified eye: Secondary | ICD-10-CM | POA: Diagnosis not present

## 2013-12-01 DIAGNOSIS — D3130 Benign neoplasm of unspecified choroid: Secondary | ICD-10-CM | POA: Diagnosis not present

## 2013-12-04 DIAGNOSIS — G20 Parkinson's disease: Secondary | ICD-10-CM | POA: Diagnosis not present

## 2013-12-09 DIAGNOSIS — R972 Elevated prostate specific antigen [PSA]: Secondary | ICD-10-CM | POA: Diagnosis not present

## 2013-12-09 DIAGNOSIS — N529 Male erectile dysfunction, unspecified: Secondary | ICD-10-CM | POA: Diagnosis not present

## 2013-12-09 DIAGNOSIS — R310 Gross hematuria: Secondary | ICD-10-CM | POA: Diagnosis not present

## 2013-12-09 DIAGNOSIS — N138 Other obstructive and reflux uropathy: Secondary | ICD-10-CM | POA: Diagnosis not present

## 2013-12-09 DIAGNOSIS — N401 Enlarged prostate with lower urinary tract symptoms: Secondary | ICD-10-CM | POA: Diagnosis not present

## 2013-12-18 ENCOUNTER — Ambulatory Visit: Payer: Medicare Other | Admitting: Occupational Therapy

## 2013-12-18 ENCOUNTER — Ambulatory Visit: Payer: Medicare Other | Attending: Internal Medicine

## 2013-12-18 ENCOUNTER — Ambulatory Visit: Payer: Medicare Other | Admitting: Physical Therapy

## 2014-01-05 ENCOUNTER — Telehealth: Payer: Self-pay | Admitting: Internal Medicine

## 2014-01-05 NOTE — Telephone Encounter (Signed)
Patient is scheduled for an appt on 01/14/14 9:30 with Dr. Carlean Purl to evaluate diarrhea

## 2014-01-09 ENCOUNTER — Encounter: Payer: Self-pay | Admitting: Gastroenterology

## 2014-01-14 ENCOUNTER — Encounter: Payer: Self-pay | Admitting: Internal Medicine

## 2014-01-14 ENCOUNTER — Ambulatory Visit (INDEPENDENT_AMBULATORY_CARE_PROVIDER_SITE_OTHER): Payer: Medicare Other | Admitting: Internal Medicine

## 2014-01-14 ENCOUNTER — Other Ambulatory Visit (INDEPENDENT_AMBULATORY_CARE_PROVIDER_SITE_OTHER): Payer: Medicare Other

## 2014-01-14 VITALS — BP 150/90 | HR 78 | Ht 73.0 in | Wt 281.0 lb

## 2014-01-14 DIAGNOSIS — K50018 Crohn's disease of small intestine with other complication: Secondary | ICD-10-CM

## 2014-01-14 DIAGNOSIS — K5000 Crohn's disease of small intestine without complications: Secondary | ICD-10-CM | POA: Diagnosis not present

## 2014-01-14 LAB — CBC WITH DIFFERENTIAL/PLATELET
Basophils Absolute: 0 10*3/uL (ref 0.0–0.1)
Basophils Relative: 0.3 % (ref 0.0–3.0)
Eosinophils Absolute: 0.2 10*3/uL (ref 0.0–0.7)
Eosinophils Relative: 3.7 % (ref 0.0–5.0)
HCT: 45.4 % (ref 39.0–52.0)
Hemoglobin: 15 g/dL (ref 13.0–17.0)
Lymphocytes Relative: 23.3 % (ref 12.0–46.0)
Lymphs Abs: 1.4 10*3/uL (ref 0.7–4.0)
MCHC: 32.9 g/dL (ref 30.0–36.0)
MCV: 85.2 fl (ref 78.0–100.0)
Monocytes Absolute: 0.6 10*3/uL (ref 0.1–1.0)
Monocytes Relative: 10.1 % (ref 3.0–12.0)
Neutro Abs: 3.7 10*3/uL (ref 1.4–7.7)
Neutrophils Relative %: 62.6 % (ref 43.0–77.0)
Platelets: 200 10*3/uL (ref 150.0–400.0)
RBC: 5.33 Mil/uL (ref 4.22–5.81)
RDW: 14.3 % (ref 11.5–15.5)
WBC: 6 10*3/uL (ref 4.0–10.5)

## 2014-01-14 LAB — COMPREHENSIVE METABOLIC PANEL
ALT: 22 U/L (ref 0–53)
AST: 19 U/L (ref 0–37)
Albumin: 3.8 g/dL (ref 3.5–5.2)
Alkaline Phosphatase: 100 U/L (ref 39–117)
BUN: 14 mg/dL (ref 6–23)
CALCIUM: 8.9 mg/dL (ref 8.4–10.5)
CHLORIDE: 104 meq/L (ref 96–112)
CO2: 29 meq/L (ref 19–32)
Creatinine, Ser: 0.9 mg/dL (ref 0.4–1.5)
GFR: 87.47 mL/min (ref >60.00)
Glucose, Bld: 101 mg/dL — ABNORMAL HIGH (ref 70–99)
Potassium: 4.1 mEq/L (ref 3.5–5.1)
SODIUM: 138 meq/L (ref 135–145)
TOTAL PROTEIN: 6.4 g/dL (ref 6.0–8.3)
Total Bilirubin: 0.5 mg/dL (ref 0.2–1.2)

## 2014-01-14 MED ORDER — BUDESONIDE 3 MG PO CP24: 9.0000 mg | ORAL_CAPSULE | Freq: Every day | ORAL | Status: DC

## 2014-01-14 NOTE — Progress Notes (Signed)
[name]  [name]    [id number]    [birth year]    Assessment and Plan/Recommendations:  Crohn's- Recent diarrhea episodes likely 2/2 to this.  Will try Entocort for more regular maintenance. Will schedule f/u colonoscopy for this year.   HPI: Kyle Ball is a 68 y.o. white male with a hx of Crohn's, Terminal ileum/cecal resection, and parkinsons. He comes today to be seen for episodes of diarrhea.  He states over the past six weeks he has had two episodes of sudden, voluminous liquid stools.  Each has consisted of only one liquid bowl movement.  He has questionable relief from Pepto bismol but doesn't ever have a second BM.  He states he has had these episodes ever 6-9 months for the past several years, worsening recently.  He denies any obvious inciting factors, fever, chills, N/V, weight loss, hematochezia, melana, or night sweats.  His last colonoscopy was 2009.    Outpatient Encounter Prescriptions as of 01/14/2014  Medication Sig  . aspirin 81 MG tablet Take 81 mg by mouth daily.    Marland Kitchen atorvastatin (LIPITOR) 20 MG tablet Take 20 mg by mouth daily.    . citalopram (CELEXA) 20 MG tablet Take 30 mg by mouth daily.   Marland Kitchen dutasteride (AVODART) 0.5 MG capsule Take 0.5 mg by mouth daily.  Marland Kitchen rOPINIRole (REQUIP) 4 MG tablet Take 4 mg by mouth at bedtime. Take 1 by mouth in the am, 1/2 by mouth mid-day, 1/2 by mouth at night.  . Tamsulosin HCl (FLOMAX) 0.4 MG CAPS Take 0.4 mg by mouth daily.    . [DISCONTINUED] sildenafil (VIAGRA) 100 MG tablet Take 100 mg by mouth daily as needed.    . budesonide (ENTOCORT EC) 3 MG 24 hr capsule Take 3 capsules (9 mg total) by mouth daily.  . [DISCONTINUED] buPROPion (WELLBUTRIN XL) 150 MG 24 hr tablet Take 150 mg by mouth. 3 by mouth in the am     Allergies as of 01/14/2014 - Review Complete 01/14/2014  Allergen Reaction Noted  . Penicillins      Past Medical History  Diagnosis Date  . Hyperlipidemia   . Crohn's disease 1979     After resection, 1 flare in 2012.  . Internal hemorrhoids   . BPH (benign prostatic hypertrophy)   . Dysmetabolic syndrome X   . Anxiety and depression   . ED (erectile dysfunction)   . Parkinson's disease     [facility]    Past Surgical History  Procedure Laterality Date  . Quadriceps repair Right 1966     knee  . Terminal ileum & appendix resected  1995    Crohn's  . Colonoscopy w/ biopsies   06/2007    Crohn's, internal hemorrhoids, Dr Carlean Purl  . Prostate biopsy      X 2, Dr Risa Grill       Review of systems: Positive for: Diarrhea All other ROS negative or as per HPI  Physical Exam: BP 150/90  Pulse 78  Ht 6\' 1"  (1.854 m)  Wt 281 lb (127.461 kg)  BMI 37.08 kg/m2 Constitutional: WDWN NAD Eyes: anicteric Mouth: oral and posterior pharynx free of lesions Neck: supple, no mass or thyromegaly Lungs: clear to auscultation bilaterally Cardiovascular: S1S2 with regular rate and rhythm, no rubs murmurs or gallops Abdomen: soft, nontender, nondistended, no organomegaly, normal bowel sounds. Soft, reducible, incisional hernia.  Defect 3-4 cm. Extremities: 1+ lower extremity edema, 1+ DP pulse, 2+ Radial pulse Skin: no rash Neuro: alert and oriented x  3. Very slight resting tremor. Psych: normal mood and affect  Data Reviewed: Previous colonoscopy Pt records  [name] A. [place], [place] 01/14/2014 10:32 AM       I have personally seen the patient, reviewed and repeated key elements of the history and physical and participated in formation of the assessment and plan the student has documented.  Gatha Mayer, MD, [facility]

## 2014-01-14 NOTE — Patient Instructions (Signed)
You have been scheduled for a colonoscopy. Please follow written instructions given to you at your visit today.  Please pick up your prep supplies at the pharmacy. If you use inhalers (even only as needed), please bring them with you on the day of your procedure. Your physician has requested that you go to www.startemmi.com and enter the access code given to you at your visit today. This web site gives a general overview about your procedure. However, you should still follow specific instructions given to you by our office regarding your preparation for the procedure.  We have sent the following medications to your pharmacy for you to pick up at your convenience: Entocort  We have sent the following medications to your pharmacy for you to pick up at your convenience: CBC/diff, CMET  I appreciate the opportunity to care for you.

## 2014-02-05 DIAGNOSIS — M1710 Unilateral primary osteoarthritis, unspecified knee: Secondary | ICD-10-CM | POA: Diagnosis not present

## 2014-02-19 DIAGNOSIS — N139 Obstructive and reflux uropathy, unspecified: Secondary | ICD-10-CM | POA: Diagnosis not present

## 2014-02-19 DIAGNOSIS — R319 Hematuria, unspecified: Secondary | ICD-10-CM | POA: Diagnosis not present

## 2014-02-19 DIAGNOSIS — N401 Enlarged prostate with lower urinary tract symptoms: Secondary | ICD-10-CM | POA: Diagnosis not present

## 2014-02-19 DIAGNOSIS — R972 Elevated prostate specific antigen [PSA]: Secondary | ICD-10-CM | POA: Diagnosis not present

## 2014-02-19 DIAGNOSIS — N138 Other obstructive and reflux uropathy: Secondary | ICD-10-CM | POA: Diagnosis not present

## 2014-03-17 DIAGNOSIS — G20 Parkinson's disease: Secondary | ICD-10-CM | POA: Diagnosis not present

## 2014-03-18 ENCOUNTER — Telehealth: Payer: Self-pay | Admitting: Internal Medicine

## 2014-03-18 NOTE — Telephone Encounter (Signed)
I called and left message on his voice mail that his prep supplies are all over the counter. I left a detailed message with what he needs and informed him to call back with further questions.

## 2014-03-20 ENCOUNTER — Encounter: Payer: Self-pay | Admitting: Internal Medicine

## 2014-03-20 ENCOUNTER — Ambulatory Visit (AMBULATORY_SURGERY_CENTER): Payer: Medicare Other | Admitting: Internal Medicine

## 2014-03-20 VITALS — BP 136/74 | HR 60 | Temp 97.4°F | Resp 10 | Ht 73.0 in | Wt 281.0 lb

## 2014-03-20 DIAGNOSIS — F341 Dysthymic disorder: Secondary | ICD-10-CM | POA: Diagnosis not present

## 2014-03-20 DIAGNOSIS — K633 Ulcer of intestine: Secondary | ICD-10-CM | POA: Diagnosis not present

## 2014-03-20 DIAGNOSIS — K50018 Crohn's disease of small intestine with other complication: Secondary | ICD-10-CM

## 2014-03-20 DIAGNOSIS — G20 Parkinson's disease: Secondary | ICD-10-CM | POA: Diagnosis not present

## 2014-03-20 DIAGNOSIS — K5090 Crohn's disease, unspecified, without complications: Secondary | ICD-10-CM | POA: Diagnosis not present

## 2014-03-20 MED ORDER — SODIUM CHLORIDE 0.9 % IV SOLN: 500.0000 mL | INTRAVENOUS | Status: DC

## 2014-03-20 NOTE — Progress Notes (Signed)
Called to room to assist during endoscopic procedure.  Patient ID and intended procedure confirmed with present staff. Received instructions for my participation in the procedure from the performing physician.  

## 2014-03-20 NOTE — Op Note (Addendum)
[name]. [place], [id number]   COLONOSCOPY PROCEDURE REPORT  PATIENT: Kyle Ball, Kyle Ball  MR#: [id number] BIRTHDATE: 1946-01-30 , 85  yrs. old GENDER: male ENDOSCOPIST: Gatha Mayer, MD, [facility] PROCEDURE DATE:  03/20/2014 PROCEDURE:   Colonoscopy with biopsy First Screening Colonoscopy - Avg.  risk and is 50 yrs.  old or older - No.  Prior Negative Screening - Now for repeat screening. N/A ASA CLASS:   Class III INDICATIONS:follow up for previously diagnosed small bowel Crohn's disease. MEDICATIONS: Propofol 200 mg IV and Monitored anesthesia care  DESCRIPTION OF PROCEDURE:   After the risks benefits and alternatives of the procedure were thoroughly explained, informed consent was obtained.  The digital rectal exam revealed no abnormalities of the rectum, revealed the prostate was not enlarged, and revealed no prostatic nodules.   The LB [id number]  endoscope was introduced through the anus and advanced to the surgical anastomosis. No adverse events experienced.   The quality of the prep was good, using MiraLax  The instrument was then slowly withdrawn as the colon was fully examined.      COLON FINDINGS: Two small non-bleeding, clean-based and irregular shaped ulcers were found at the surgical anastomosis.  Biopsies were taken at the center of the ulcers and at edge of the ulcers. There was a friable and benign appearing stricture at the surgical anastomosis.  The stricture was not traversable.  rectal retroflex exam was normal.      The time to cecum= 1:57 mins    .  Withdrawal time = 9:21 minutes     .  The scope was withdrawn and the procedure completed. COMPLICATIONS: There were no immediate complications.  ENDOSCOPIC IMPRESSION: Active Crohn's at ileo-colonic anastopmosis with stricture and ulceration.  RECOMMENDATIONS: Await biopises - will call Try low fiber diet - his episodic urgent diarrhea could be  partial obstruction related to roughage  eSigned:  Gatha Mayer, MD, [facility] 03/20/2014 4:26 PM Revised: 03/20/2014 4:26 PM  cc: The Patient

## 2014-03-20 NOTE — Patient Instructions (Addendum)
There is inflammation and scarring where the large and small intestine meet. I took biopsies and will notify you. This looks like the Crohn's though it is not severe. Everything else looked ok.  Next routine colonoscopy in 10 years - 2025  I appreciate the opportunity to care for you. Gatha Mayer, MD, [facility]  Discharge instructions given with verbal understanding. Biopsies taken. Resume previous medications. YOU HAD AN ENDOSCOPIC PROCEDURE TODAY AT [facility]: Refer to the procedure report that was given to you for any specific questions about what was found during the examination.  If the procedure report does not answer your questions, please call your gastroenterologist to clarify.  If you requested that your care partner not be given the details of your procedure findings, then the procedure report has been included in a sealed envelope for you to review at your convenience later.  YOU SHOULD EXPECT: Some feelings of bloating in the abdomen. Passage of more gas than usual.  Walking can help get rid of the air that was put into your GI tract during the procedure and reduce the bloating. If you had a lower endoscopy (such as a colonoscopy or flexible sigmoidoscopy) you may notice spotting of blood in your stool or on the toilet paper. If you underwent a bowel prep for your procedure, then you may not have a normal bowel movement for a few days.  DIET: Your first meal following the procedure should be a light meal and then it is ok to progress to your normal diet.  A half-sandwich or bowl of soup is an example of a good first meal.  Heavy or fried foods are harder to digest and may make you feel nauseous or bloated.  Likewise meals heavy in dairy and vegetables can cause extra gas to form and this can also increase the bloating.  Drink plenty of fluids but you should avoid alcoholic beverages for 24 hours.  ACTIVITY: Your care partner should take you home directly  after the procedure.  You should plan to take it easy, moving slowly for the rest of the day.  You can resume normal activity the day after the procedure however you should NOT DRIVE or use heavy machinery for 24 hours (because of the sedation medicines used during the test).    SYMPTOMS TO REPORT IMMEDIATELY: A gastroenterologist can be reached at any hour.  During normal business hours, 8:30 AM to 5:00 PM Monday through Friday, call [phone].  After hours and on weekends, please call the GI answering service at [phone] who will take a message and have the physician on call contact you.   Following lower endoscopy (colonoscopy or flexible sigmoidoscopy):  Excessive amounts of blood in the stool  Significant tenderness or worsening of abdominal pains  Swelling of the abdomen that is new, acute  Fever of 100F or higher FOLLOW UP: If any biopsies were taken you will be contacted by phone or by letter within the next 1-3 weeks.  Call your gastroenterologist if you have not heard about the biopsies in 3 weeks.  Our staff will call the home number listed on your records the next business day following your procedure to check on you and address any questions or concerns that you may have at that time regarding the information given to you following your procedure. This is a courtesy call and so if there is no answer at the home number and we have not heard from you through  the emergency physician on call, we will assume that you have returned to your regular daily activities without incident.  SIGNATURES/CONFIDENTIALITY: You and/or your care partner have signed paperwork which will be entered into your electronic medical record.  These signatures attest to the fact that that the information above on your After Visit Summary has been reviewed and is understood.  Full responsibility of the confidentiality of this discharge information lies with you and/or your care-partner.

## 2014-03-20 NOTE — Progress Notes (Signed)
Report to PACU, RN, vss, BBS= Clear.  

## 2014-03-23 ENCOUNTER — Telehealth: Payer: Self-pay | Admitting: *Deleted

## 2014-03-23 NOTE — Telephone Encounter (Signed)
No answer. Number identifier. Message left to call if any questions or concerns. 

## 2014-03-25 NOTE — Progress Notes (Signed)
Quick Note:  Biopsies show some inflammation as expected I want him to try a low fiber diet as instructed See me in 6-8 weeks  [facility] - colon recall 10 yrs and remove any other recalls  ______

## 2014-04-28 DIAGNOSIS — F3341 Major depressive disorder, recurrent, in partial remission: Secondary | ICD-10-CM | POA: Diagnosis not present

## 2014-04-28 DIAGNOSIS — F900 Attention-deficit hyperactivity disorder, predominantly inattentive type: Secondary | ICD-10-CM | POA: Diagnosis not present

## 2014-05-20 ENCOUNTER — Encounter: Payer: Self-pay | Admitting: Gastroenterology

## 2014-05-21 DIAGNOSIS — H524 Presbyopia: Secondary | ICD-10-CM | POA: Diagnosis not present

## 2014-05-21 DIAGNOSIS — H2513 Age-related nuclear cataract, bilateral: Secondary | ICD-10-CM | POA: Diagnosis not present

## 2014-05-21 DIAGNOSIS — H35372 Puckering of macula, left eye: Secondary | ICD-10-CM | POA: Diagnosis not present

## 2014-05-27 ENCOUNTER — Ambulatory Visit (INDEPENDENT_AMBULATORY_CARE_PROVIDER_SITE_OTHER): Payer: Medicare Other | Admitting: Internal Medicine

## 2014-05-27 ENCOUNTER — Encounter: Payer: Self-pay | Admitting: Internal Medicine

## 2014-05-27 VITALS — BP 144/84 | HR 88 | Ht 74.0 in | Wt 278.8 lb

## 2014-05-27 DIAGNOSIS — K50012 Crohn's disease of small intestine with intestinal obstruction: Secondary | ICD-10-CM

## 2014-05-27 MED ORDER — ZOSTER VACCINE LIVE 19400 UNT/0.65ML ~~LOC~~ SOLR: 0.6500 mL | Freq: Once | SUBCUTANEOUS | Status: DC

## 2014-05-27 NOTE — Assessment & Plan Note (Addendum)
Stop budesonide now I explained try not to use long-term but can. Cost would be an issue. The natural hx of his disease has been slow and not aggressive. May be ok for a while off Tx. Next options would be immunomodulators and/or biologics. I introduced these medications and explained in a 15 min visit how they are used, side effects possible and possible risks. Handouts from [place] on both classes of given to read.  RTC 2 months and will reassess and determine next step(s)

## 2014-05-27 NOTE — Patient Instructions (Addendum)
Today you have been given information to read on Immunomodulators and Biologics.  Stop your Entocort per Dr Carlean Purl.  Follow up with [place] in 2 months, he will call back to set up.  Today we are providing you with a zostavax rx to take to the pharmacy.   I appreciate the opportunity to care for you.

## 2014-05-27 NOTE — Progress Notes (Signed)
   Subjective:    Patient ID: Kyle Ball, male    DOB: 1945/12/22, 68 y.o.   MRN: [id number]  HPI The patient is here for f/y Crohn's disease. He was having intermittent diarrhea, off therapy. He had an ileo-colonic resection many years ago. Colonoscopy 03/2014 showed inflammatory +/- fibrotic stricture at ileo-colonic anastomosis.  I started budesonide 9 mg daily. He feels well on that.  Cost of that is an issue - $300/month.  Medications, allergies, past medical history, past surgical history, family history and social history are reviewed and updated in the EMR.  Review of Systems As above Parkinson's stable About to have cataract surgery in Jan (Kyle Ball)    Objective:   Physical Exam Obese, NAD    Assessment & Plan:  Crohn's disease of ileum with intestinal obstruction Stop budesonide now I explained try not to use long-term but can. Cost would be an issue. The natural hx of his disease has been slow and not aggressive. May be ok for a while off Tx. Next options would be immunomodulators and/or biologics. I introduced these medications and explained in a 15 min visit how they are used, side effects possible and possible risks. Handouts from [place] on both classes of given to read.  RTC 2 months and will reassess and determine next step(s)

## 2014-06-22 DIAGNOSIS — H2512 Age-related nuclear cataract, left eye: Secondary | ICD-10-CM | POA: Diagnosis not present

## 2014-07-06 DIAGNOSIS — H25812 Combined forms of age-related cataract, left eye: Secondary | ICD-10-CM | POA: Diagnosis not present

## 2014-07-06 DIAGNOSIS — H2512 Age-related nuclear cataract, left eye: Secondary | ICD-10-CM | POA: Diagnosis not present

## 2014-08-06 DIAGNOSIS — H5231 Anisometropia: Secondary | ICD-10-CM | POA: Diagnosis not present

## 2014-08-06 DIAGNOSIS — H59032 Cystoid macular edema following cataract surgery, left eye: Secondary | ICD-10-CM | POA: Diagnosis not present

## 2014-08-25 DIAGNOSIS — R972 Elevated prostate specific antigen [PSA]: Secondary | ICD-10-CM | POA: Diagnosis not present

## 2014-09-01 DIAGNOSIS — R3912 Poor urinary stream: Secondary | ICD-10-CM | POA: Diagnosis not present

## 2014-09-01 DIAGNOSIS — N5201 Erectile dysfunction due to arterial insufficiency: Secondary | ICD-10-CM | POA: Diagnosis not present

## 2014-09-01 DIAGNOSIS — N401 Enlarged prostate with lower urinary tract symptoms: Secondary | ICD-10-CM | POA: Diagnosis not present

## 2014-09-29 DIAGNOSIS — Z7982 Long term (current) use of aspirin: Secondary | ICD-10-CM | POA: Diagnosis not present

## 2014-09-29 DIAGNOSIS — Z880 Allergy status to penicillin: Secondary | ICD-10-CM | POA: Diagnosis not present

## 2014-09-29 DIAGNOSIS — E669 Obesity, unspecified: Secondary | ICD-10-CM | POA: Diagnosis not present

## 2014-09-29 DIAGNOSIS — G4752 REM sleep behavior disorder: Secondary | ICD-10-CM | POA: Diagnosis not present

## 2014-09-29 DIAGNOSIS — Z79899 Other long term (current) drug therapy: Secondary | ICD-10-CM | POA: Diagnosis not present

## 2014-09-29 DIAGNOSIS — G20 Parkinson's disease: Secondary | ICD-10-CM | POA: Diagnosis not present

## 2014-09-29 DIAGNOSIS — G4719 Other hypersomnia: Secondary | ICD-10-CM | POA: Diagnosis not present

## 2015-01-14 DIAGNOSIS — H2511 Age-related nuclear cataract, right eye: Secondary | ICD-10-CM | POA: Diagnosis not present

## 2015-01-14 DIAGNOSIS — H524 Presbyopia: Secondary | ICD-10-CM | POA: Diagnosis not present

## 2015-01-15 ENCOUNTER — Encounter: Payer: Self-pay | Admitting: Internal Medicine

## 2015-02-04 ENCOUNTER — Ambulatory Visit (INDEPENDENT_AMBULATORY_CARE_PROVIDER_SITE_OTHER): Payer: Medicare Other | Admitting: Internal Medicine

## 2015-02-04 ENCOUNTER — Encounter: Payer: Self-pay | Admitting: Internal Medicine

## 2015-02-04 VITALS — BP 120/80 | HR 78 | Temp 98.0°F | Resp 16 | Ht 74.0 in | Wt 277.0 lb

## 2015-02-04 DIAGNOSIS — G20 Parkinson's disease: Secondary | ICD-10-CM

## 2015-02-04 DIAGNOSIS — G20A1 Parkinson's disease without dyskinesia, without mention of fluctuations: Secondary | ICD-10-CM

## 2015-02-04 DIAGNOSIS — Z8639 Personal history of other endocrine, nutritional and metabolic disease: Secondary | ICD-10-CM | POA: Diagnosis not present

## 2015-02-04 DIAGNOSIS — R739 Hyperglycemia, unspecified: Secondary | ICD-10-CM | POA: Diagnosis not present

## 2015-02-04 DIAGNOSIS — R030 Elevated blood-pressure reading, without diagnosis of hypertension: Secondary | ICD-10-CM | POA: Diagnosis not present

## 2015-02-04 DIAGNOSIS — E782 Mixed hyperlipidemia: Secondary | ICD-10-CM | POA: Diagnosis not present

## 2015-02-04 DIAGNOSIS — K50012 Crohn's disease of small intestine with intestinal obstruction: Secondary | ICD-10-CM

## 2015-02-04 NOTE — Patient Instructions (Signed)
  Your next office appointment will be determined based upon review of your pending labs. Those written interpretation of the lab results and instructions will be transmitted to you by My Chart  Critical results will be called.   Followup as needed for any active or acute issue. Please report any significant change in your symptoms. 

## 2015-02-04 NOTE — Progress Notes (Signed)
   Subjective:    Patient ID: Kyle Ball, male    DOB: [birth year], 69 y.o.   MRN: [id number]  HPI The patient is here to assess status of active health conditions.  PMH, FH, & Social History reviewed & updated.No change in [place] as recorded.  He has been off his statin for approximately 6 weeks. He does eat increased amounts of red meat. [facility] well as in the hospital exercise program for Parkinson's. He has no associated cardiopulmonary symptoms with exercise.  He continues active treatment at [facility] every 6 months for his Parkinson's.  Colonoscopy is up-to-date. GI review systems is negative except for occasional loose stool.  He sees his Urologist at least annually for prostatic enlargement. He is on generic tamsulosin and Avodart.  Review of systems otherwise negative except for some heat intolerance.  Review of Systems  Chest pain, palpitations, tachycardia, exertional dyspnea, paroxysmal nocturnal dyspnea, claudication or edema are absent. No unexplained weight loss, abdominal pain, significant dyspepsia, dysphagia, melena, rectal bleeding, or persistently small caliber stools. Dysuria, pyuria, hematuria, frequency, nocturia or polyuria are denied. Change in hair, skin, nails denied. No bowel changes of constipation or diarrhea. No intolerance to cold.     Objective:   Physical Exam Pertinent or positive findings include:BMI 35.55.Classic parkinsonian facies. He has pattern alopecia. Bilateral ptosis is present. He has nonsustained lateral nystagmus. Gynecomastia suggested. Abdomen is protuberant with a ventral and incisional hernia. Crepitus is present in the right knee greater than the left. He has trace-1/2+ edema. Gait is slow and methodical. Tone is increased. Deep tendon reflexes are 1.5+ at the knees.  General appearance :adequately nourished; in no distress.  Eyes: No conjunctival inflammation or scleral icterus is  present.  Oral exam:  Lips and gums are healthy appearing.There is no oropharyngeal erythema or exudate noted. Dental hygiene is good.  Heart:  Normal rate and regular rhythm. S1 and S2 normal without gallop, murmur, click, rub or other extra sounds    Lungs:Chest clear to auscultation; no wheezes, rhonchi,rales ,or rubs present.No increased work of breathing.   Abdomen: bowel sounds normal, soft and non-tender without masses, or organomegaly noted.  No guarding or rebound.   Vascular : all pulses equal ; no bruits present.  Skin:Warm & dry.  Intact without suspicious lesions or rashes ; no tenting or jaundice   Lymphatic: No lymphadenopathy is noted about the head, neck, axilla     Assessment & Plan:  See Current Assessment & Plan in Problem List under specific Diagnosis

## 2015-02-04 NOTE — Assessment & Plan Note (Signed)
A1c

## 2015-02-04 NOTE — Assessment & Plan Note (Signed)
CBC

## 2015-02-04 NOTE — Progress Notes (Signed)
Pre visit review using our clinic review tool, if applicable. No additional management support is needed unless otherwise documented below in the visit note. 

## 2015-02-04 NOTE — Assessment & Plan Note (Addendum)
Lipids, LFTs, TSH  

## 2015-02-04 NOTE — Assessment & Plan Note (Signed)
Blood pressure goals reviewed. BMET 

## 2015-02-04 NOTE — Assessment & Plan Note (Signed)
B12 and methylmalonic acid levels

## 2015-02-26 ENCOUNTER — Other Ambulatory Visit (INDEPENDENT_AMBULATORY_CARE_PROVIDER_SITE_OTHER): Payer: Medicare Other

## 2015-02-26 DIAGNOSIS — R739 Hyperglycemia, unspecified: Secondary | ICD-10-CM | POA: Diagnosis not present

## 2015-02-26 DIAGNOSIS — E785 Hyperlipidemia, unspecified: Secondary | ICD-10-CM

## 2015-02-26 DIAGNOSIS — E782 Mixed hyperlipidemia: Secondary | ICD-10-CM

## 2015-02-26 DIAGNOSIS — R030 Elevated blood-pressure reading, without diagnosis of hypertension: Secondary | ICD-10-CM | POA: Diagnosis not present

## 2015-02-26 DIAGNOSIS — Z8639 Personal history of other endocrine, nutritional and metabolic disease: Secondary | ICD-10-CM

## 2015-02-26 LAB — HEPATIC FUNCTION PANEL
ALBUMIN: 4.2 g/dL (ref 3.5–5.2)
ALK PHOS: 97 U/L (ref 39–117)
ALT: 21 U/L (ref 0–53)
AST: 15 U/L (ref 0–37)
Bilirubin, Direct: 0.2 mg/dL (ref 0.0–0.3)
TOTAL PROTEIN: 7 g/dL (ref 6.0–8.3)
Total Bilirubin: 0.8 mg/dL (ref 0.2–1.2)

## 2015-02-26 LAB — BASIC METABOLIC PANEL
BUN: 12 mg/dL (ref 6–23)
CALCIUM: 9.3 mg/dL (ref 8.4–10.5)
CHLORIDE: 104 meq/L (ref 96–112)
CO2: 31 meq/L (ref 19–32)
Creatinine, Ser: 0.92 mg/dL (ref 0.40–1.50)
GFR: 87.18 mL/min (ref >60.00)
Glucose, Bld: 94 mg/dL (ref 70–99)
Potassium: 4.3 mEq/L (ref 3.5–5.1)
SODIUM: 140 meq/L (ref 135–145)

## 2015-02-26 LAB — CBC WITH DIFFERENTIAL/PLATELET
BASOS ABS: 0 10*3/uL (ref 0.0–0.1)
BASOS PCT: 0.4 % (ref 0.0–3.0)
Eosinophils Absolute: 0.2 10*3/uL (ref 0.0–0.7)
Eosinophils Relative: 3.8 % (ref 0.0–5.0)
HEMATOCRIT: 48.7 % (ref 39.0–52.0)
HEMOGLOBIN: 16.2 g/dL (ref 13.0–17.0)
LYMPHS PCT: 29.9 % (ref 12.0–46.0)
Lymphs Abs: 1.7 10*3/uL (ref 0.7–4.0)
MCHC: 33.4 g/dL (ref 30.0–36.0)
MCV: 83.6 fl (ref 78.0–100.0)
MONOS PCT: 8.7 % (ref 3.0–12.0)
Monocytes Absolute: 0.5 10*3/uL (ref 0.1–1.0)
NEUTROS ABS: 3.3 10*3/uL (ref 1.4–7.7)
Neutrophils Relative %: 57.2 % (ref 43.0–77.0)
PLATELETS: 227 10*3/uL (ref 150.0–400.0)
RBC: 5.83 Mil/uL — ABNORMAL HIGH (ref 4.22–5.81)
RDW: 13.9 % (ref 11.5–15.5)
WBC: 5.7 10*3/uL (ref 4.0–10.5)

## 2015-02-26 LAB — LIPID PANEL
Cholesterol: 196 mg/dL (ref 0–200)
HDL: 33.9 mg/dL — AB (ref >39.00)
NonHDL: 162.15
Total CHOL/HDL Ratio: 6
Triglycerides: 265 mg/dL — ABNORMAL HIGH (ref 0.0–149.0)
VLDL: 53 mg/dL — ABNORMAL HIGH (ref 0.0–40.0)

## 2015-02-26 LAB — LDL CHOLESTEROL, DIRECT: Direct LDL: 102 mg/dL

## 2015-02-26 LAB — VITAMIN B12: VITAMIN B 12: 288 pg/mL (ref 211–911)

## 2015-02-26 LAB — TSH: TSH: 0.56 u[IU]/mL (ref 0.35–4.50)

## 2015-02-26 LAB — HEMOGLOBIN A1C: HEMOGLOBIN A1C: 5.4 % (ref 4.6–6.5)

## 2015-03-01 ENCOUNTER — Encounter: Payer: Self-pay | Admitting: Internal Medicine

## 2015-03-02 LAB — METHYLMALONIC ACID, SERUM: METHYLMALONIC ACID, QUANT: 143 nmol/L (ref 87–318)

## 2015-03-12 DIAGNOSIS — G4719 Other hypersomnia: Secondary | ICD-10-CM | POA: Diagnosis not present

## 2015-03-12 DIAGNOSIS — G4752 REM sleep behavior disorder: Secondary | ICD-10-CM | POA: Diagnosis not present

## 2015-03-17 DIAGNOSIS — Z7982 Long term (current) use of aspirin: Secondary | ICD-10-CM | POA: Diagnosis not present

## 2015-03-17 DIAGNOSIS — E785 Hyperlipidemia, unspecified: Secondary | ICD-10-CM | POA: Diagnosis not present

## 2015-03-17 DIAGNOSIS — Z79899 Other long term (current) drug therapy: Secondary | ICD-10-CM | POA: Diagnosis not present

## 2015-03-17 DIAGNOSIS — R972 Elevated prostate specific antigen [PSA]: Secondary | ICD-10-CM | POA: Diagnosis not present

## 2015-03-17 DIAGNOSIS — G20 Parkinson's disease: Secondary | ICD-10-CM | POA: Diagnosis not present

## 2015-03-17 DIAGNOSIS — G4733 Obstructive sleep apnea (adult) (pediatric): Secondary | ICD-10-CM | POA: Diagnosis not present

## 2015-03-23 DIAGNOSIS — R3912 Poor urinary stream: Secondary | ICD-10-CM | POA: Diagnosis not present

## 2015-03-23 DIAGNOSIS — N5201 Erectile dysfunction due to arterial insufficiency: Secondary | ICD-10-CM | POA: Diagnosis not present

## 2015-03-23 DIAGNOSIS — R972 Elevated prostate specific antigen [PSA]: Secondary | ICD-10-CM | POA: Diagnosis not present

## 2015-03-23 DIAGNOSIS — N401 Enlarged prostate with lower urinary tract symptoms: Secondary | ICD-10-CM | POA: Diagnosis not present

## 2015-03-31 DIAGNOSIS — G20 Parkinson's disease: Secondary | ICD-10-CM | POA: Diagnosis not present

## 2015-04-15 DIAGNOSIS — E785 Hyperlipidemia, unspecified: Secondary | ICD-10-CM | POA: Diagnosis not present

## 2015-04-15 DIAGNOSIS — G20 Parkinson's disease: Secondary | ICD-10-CM | POA: Diagnosis not present

## 2015-04-15 DIAGNOSIS — Z9989 Dependence on other enabling machines and devices: Secondary | ICD-10-CM | POA: Diagnosis not present

## 2015-04-15 DIAGNOSIS — G4733 Obstructive sleep apnea (adult) (pediatric): Secondary | ICD-10-CM | POA: Diagnosis not present

## 2015-04-15 DIAGNOSIS — Z7982 Long term (current) use of aspirin: Secondary | ICD-10-CM | POA: Diagnosis not present

## 2015-04-15 DIAGNOSIS — Z79899 Other long term (current) drug therapy: Secondary | ICD-10-CM | POA: Diagnosis not present

## 2015-04-27 DIAGNOSIS — G4733 Obstructive sleep apnea (adult) (pediatric): Secondary | ICD-10-CM | POA: Diagnosis not present

## 2015-04-29 DIAGNOSIS — F900 Attention-deficit hyperactivity disorder, predominantly inattentive type: Secondary | ICD-10-CM | POA: Diagnosis not present

## 2015-04-29 DIAGNOSIS — F3341 Major depressive disorder, recurrent, in partial remission: Secondary | ICD-10-CM | POA: Diagnosis not present

## 2015-05-27 ENCOUNTER — Telehealth: Payer: Self-pay

## 2015-05-27 NOTE — Telephone Encounter (Signed)
Left Voice Mail for pt to call back.   RE: Flu Vaccine for 2016

## 2015-06-23 DIAGNOSIS — Z880 Allergy status to penicillin: Secondary | ICD-10-CM | POA: Diagnosis not present

## 2015-06-23 DIAGNOSIS — R5383 Other fatigue: Secondary | ICD-10-CM | POA: Diagnosis not present

## 2015-06-23 DIAGNOSIS — Z79899 Other long term (current) drug therapy: Secondary | ICD-10-CM | POA: Diagnosis not present

## 2015-06-23 DIAGNOSIS — G20 Parkinson's disease: Secondary | ICD-10-CM | POA: Diagnosis not present

## 2015-06-23 DIAGNOSIS — Z7982 Long term (current) use of aspirin: Secondary | ICD-10-CM | POA: Diagnosis not present

## 2015-06-23 DIAGNOSIS — E785 Hyperlipidemia, unspecified: Secondary | ICD-10-CM | POA: Diagnosis not present

## 2015-06-23 DIAGNOSIS — G4730 Sleep apnea, unspecified: Secondary | ICD-10-CM | POA: Diagnosis not present

## 2015-06-23 DIAGNOSIS — Z7189 Other specified counseling: Secondary | ICD-10-CM | POA: Diagnosis not present

## 2015-07-22 DIAGNOSIS — H35372 Puckering of macula, left eye: Secondary | ICD-10-CM | POA: Diagnosis not present

## 2015-07-22 DIAGNOSIS — H2511 Age-related nuclear cataract, right eye: Secondary | ICD-10-CM | POA: Diagnosis not present

## 2015-07-22 DIAGNOSIS — H524 Presbyopia: Secondary | ICD-10-CM | POA: Diagnosis not present

## 2015-07-22 DIAGNOSIS — H26492 Other secondary cataract, left eye: Secondary | ICD-10-CM | POA: Diagnosis not present

## 2015-10-26 DIAGNOSIS — F900 Attention-deficit hyperactivity disorder, predominantly inattentive type: Secondary | ICD-10-CM | POA: Diagnosis not present

## 2015-10-26 DIAGNOSIS — F3341 Major depressive disorder, recurrent, in partial remission: Secondary | ICD-10-CM | POA: Diagnosis not present

## 2015-11-16 DIAGNOSIS — Z79899 Other long term (current) drug therapy: Secondary | ICD-10-CM | POA: Diagnosis not present

## 2015-11-16 DIAGNOSIS — G20 Parkinson's disease: Secondary | ICD-10-CM | POA: Diagnosis not present

## 2015-11-16 DIAGNOSIS — Z7982 Long term (current) use of aspirin: Secondary | ICD-10-CM | POA: Diagnosis not present

## 2015-11-16 DIAGNOSIS — Z880 Allergy status to penicillin: Secondary | ICD-10-CM | POA: Diagnosis not present

## 2015-11-16 DIAGNOSIS — E785 Hyperlipidemia, unspecified: Secondary | ICD-10-CM | POA: Diagnosis not present

## 2015-11-26 DIAGNOSIS — M1711 Unilateral primary osteoarthritis, right knee: Secondary | ICD-10-CM | POA: Diagnosis not present

## 2015-11-26 DIAGNOSIS — M25561 Pain in right knee: Secondary | ICD-10-CM | POA: Diagnosis not present

## 2016-03-09 ENCOUNTER — Ambulatory Visit (HOSPITAL_BASED_OUTPATIENT_CLINIC_OR_DEPARTMENT_OTHER)
Admission: RE | Admit: 2016-03-09 | Discharge: 2016-03-09 | Disposition: A | Discharge status: Home / Self Care | Payer: Medicare Other | Source: Ambulatory Visit | Attending: Medical | Admitting: Medical

## 2016-03-09 ENCOUNTER — Ambulatory Visit (INDEPENDENT_AMBULATORY_CARE_PROVIDER_SITE_OTHER): Payer: Medicare Other | Admitting: Medical

## 2016-03-09 ENCOUNTER — Encounter: Payer: Self-pay | Admitting: Medical

## 2016-03-09 VITALS — BP 111/65 | HR 76 | Temp 98.2°F | Ht 74.0 in | Wt 261.0 lb

## 2016-03-09 DIAGNOSIS — M47896 Other spondylosis, lumbar region: Secondary | ICD-10-CM | POA: Insufficient documentation

## 2016-03-09 DIAGNOSIS — R5383 Other fatigue: Secondary | ICD-10-CM

## 2016-03-09 DIAGNOSIS — M5136 Other intervertebral disc degeneration, lumbar region: Secondary | ICD-10-CM | POA: Insufficient documentation

## 2016-03-09 DIAGNOSIS — K8020 Calculus of gallbladder without cholecystitis without obstruction: Secondary | ICD-10-CM | POA: Diagnosis not present

## 2016-03-09 DIAGNOSIS — I708 Atherosclerosis of other arteries: Secondary | ICD-10-CM | POA: Diagnosis not present

## 2016-03-09 DIAGNOSIS — M545 Low back pain, unspecified: Secondary | ICD-10-CM

## 2016-03-09 DIAGNOSIS — M47897 Other spondylosis, lumbosacral region: Secondary | ICD-10-CM | POA: Diagnosis not present

## 2016-03-09 DIAGNOSIS — M5137 Other intervertebral disc degeneration, lumbosacral region: Secondary | ICD-10-CM | POA: Insufficient documentation

## 2016-03-09 DIAGNOSIS — I700 Atherosclerosis of aorta: Secondary | ICD-10-CM | POA: Diagnosis not present

## 2016-03-09 MED ORDER — TIZANIDINE HCL 4 MG PO TABS: 4.0000 mg | ORAL_TABLET | Freq: Every day | ORAL | 0 refills | Status: DC

## 2016-03-09 NOTE — Patient Instructions (Addendum)
For your back pain I will rx zanaflex low dose to use at night. You could use tylenol for pain as well.  Will get lumbar spine xray today.  For fatigue will get cbc, cmp and tsh. Though as you mention may be parkinson related.(however with slight low bp I do think labs are a good idea).  Follow up in 7 days or as needed. Back stretches as tolerated. Mild to moderate activity. Avoid complete bed rest.  I was considering tramadol but potential interaction with your current meds. Also I don't want to give you oversedating narcotic. So might have to rx tramadol if you don't respond to above. Could talk over with your son who is MD if needed.  Did ask pt and wife to put MD son on his hippa form so I could talk with him if her were to call.  Follow up in 7 days or as needed

## 2016-03-09 NOTE — Progress Notes (Signed)
Pre visit review using our clinic tool,if applicable. No additional management support is needed unless otherwise documented below in the visit note.  

## 2016-03-09 NOTE — Progress Notes (Signed)
   Subjective:    Patient ID: Kyle Ball, male    DOB: [birth year], 70 y.o.   MRN: [id number]  HPI   Pt in with some back pain. Pt states started 2 days ago was pulling/picking up some shrubbs. Pt states when picked up shrubs felt pain. Day before also after yard work felt some pain.  Pt wife informs me he does do boxing. Therapy for parkinsons. But has not done recently.   Pt in past has occasional low level sore type of pain. But self limited type of pain.  Pt wife gave him some flexeril last night. Also gave it this am.   Pt also took 2 tylenol last night.   Pt has good kidney function.  Does report history of chrons disease. 2 flare ups in past 2-3 years. Terminal ilium had been removed.   Pt has pain left side of back on sitting up and when sitting down. No radicular back pain.   Some fatigue but contribute to parkisons.    Review of Systems  Constitutional: Positive for fatigue. Negative for chills and fever.  Respiratory: Negative for cough, chest tightness, shortness of breath and wheezing.   Cardiovascular: Negative for palpitations.  Gastrointestinal: Negative for abdominal pain.  Genitourinary: Negative for decreased urine volume, dysuria, frequency and urgency.  Musculoskeletal: Positive for back pain.       No radicular pain. No saddle anesthesia.  Skin: Negative for rash.  Hematological: Negative for adenopathy. Does not bruise/bleed easily.       Objective:   Physical Exam   General Appearance- Not in acute distress.    Chest and Lung Exam Auscultation: Breath sounds:-Normal. Clear even and unlabored. Adventitious sounds:- No Adventitious sounds.  Cardiovascular Auscultation:Rythm - Regular, rate and rythm. Heart Sounds -Normal heart sounds.  Abdomen Inspection:-Inspection Normal.  Palpation/Perucssion: Palpation and Percussion of the abdomen reveal- Non Tender, No Rebound tenderness, No rigidity(Guarding) and No Palpable abdominal masses.    Liver:-Normal.  Spleen:- Normal.   Back No mid lumbar spine tenderness to palpation. Pain left lower back on standing and sitting down Pain as well leaning forward. Lower ext neurologic  L5-S1 sensation intact bilaterally. No foot drop bilaterally.     Assessment & Plan:  For your back pain I will rx zanaflex low dose to use at night. You could use tylenol for pain as well.  Will get lumbar spine xray today.  For fatigue will get cbc, cmp and tsh. Though as you mention may be parkinson related.(however with slight low bp I do think labs are a good idea).  Follow up in 7 days or as needed. Back stretches as tolerated. Mild to moderate activity. Avoid complete bed rest.  Lania Zawistowski, Percell Miller, PA-C

## 2016-03-10 ENCOUNTER — Other Ambulatory Visit: Payer: Self-pay | Admitting: Medical

## 2016-03-10 ENCOUNTER — Ambulatory Visit: Payer: Medicare Other | Admitting: Family

## 2016-03-10 ENCOUNTER — Telehealth: Payer: Self-pay | Admitting: Medical

## 2016-03-10 DIAGNOSIS — E785 Hyperlipidemia, unspecified: Secondary | ICD-10-CM

## 2016-03-10 LAB — CBC WITH DIFFERENTIAL/PLATELET
BASOS PCT: 0.3 % (ref 0.0–3.0)
Basophils Absolute: 0 10*3/uL (ref 0.0–0.1)
EOS ABS: 0.2 10*3/uL (ref 0.0–0.7)
Eosinophils Relative: 3.2 % (ref 0.0–5.0)
HCT: 49.1 % (ref 39.0–52.0)
Hemoglobin: 16.5 g/dL (ref 13.0–17.0)
Lymphocytes Relative: 21 % (ref 12.0–46.0)
Lymphs Abs: 1.6 10*3/uL (ref 0.7–4.0)
MCHC: 33.5 g/dL (ref 30.0–36.0)
MCV: 84 fl (ref 78.0–100.0)
MONO ABS: 0.5 10*3/uL (ref 0.1–1.0)
Monocytes Relative: 6.3 % (ref 3.0–12.0)
NEUTROS ABS: 5.4 10*3/uL (ref 1.4–7.7)
Neutrophils Relative %: 69.2 % (ref 43.0–77.0)
PLATELETS: 228 10*3/uL (ref 150.0–400.0)
RBC: 5.85 Mil/uL — ABNORMAL HIGH (ref 4.22–5.81)
RDW: 14.4 % (ref 11.5–15.5)
WBC: 7.8 10*3/uL (ref 4.0–10.5)

## 2016-03-10 LAB — COMPREHENSIVE METABOLIC PANEL
ALT: 9 U/L (ref 0–53)
AST: 15 U/L (ref 0–37)
Albumin: 4 g/dL (ref 3.5–5.2)
Alkaline Phosphatase: 82 U/L (ref 39–117)
BUN: 15 mg/dL (ref 6–23)
CHLORIDE: 105 meq/L (ref 96–112)
CO2: 27 mEq/L (ref 19–32)
CREATININE: 0.8 mg/dL (ref 0.40–1.50)
Calcium: 9.1 mg/dL (ref 8.4–10.5)
GFR: 102.11 mL/min (ref >60.00)
Glucose, Bld: 94 mg/dL (ref 70–99)
Potassium: 4 mEq/L (ref 3.5–5.1)
SODIUM: 139 meq/L (ref 135–145)
Total Bilirubin: 0.5 mg/dL (ref 0.2–1.2)
Total Protein: 6.9 g/dL (ref 6.0–8.3)

## 2016-03-10 LAB — TSH: TSH: 0.62 u[IU]/mL (ref 0.35–4.50)

## 2016-03-10 NOTE — Telephone Encounter (Signed)
Notified pt of xrays results and advised future lab order placed.

## 2016-04-05 DIAGNOSIS — H524 Presbyopia: Secondary | ICD-10-CM | POA: Diagnosis not present

## 2016-04-05 DIAGNOSIS — H26492 Other secondary cataract, left eye: Secondary | ICD-10-CM | POA: Diagnosis not present

## 2016-04-05 DIAGNOSIS — H35372 Puckering of macula, left eye: Secondary | ICD-10-CM | POA: Diagnosis not present

## 2016-04-05 DIAGNOSIS — H2511 Age-related nuclear cataract, right eye: Secondary | ICD-10-CM | POA: Diagnosis not present

## 2016-04-18 DIAGNOSIS — F3341 Major depressive disorder, recurrent, in partial remission: Secondary | ICD-10-CM | POA: Diagnosis not present

## 2016-04-18 DIAGNOSIS — F900 Attention-deficit hyperactivity disorder, predominantly inattentive type: Secondary | ICD-10-CM | POA: Diagnosis not present

## 2016-06-14 ENCOUNTER — Telehealth: Payer: Self-pay | Admitting: Internal Medicine

## 2016-06-14 NOTE — Telephone Encounter (Signed)
One month history of diarrhea.  He has tried OTC products with little relief.  He will be rescheduled to see Nicoletta Ba PA on 06/16/16

## 2016-06-16 ENCOUNTER — Ambulatory Visit (INDEPENDENT_AMBULATORY_CARE_PROVIDER_SITE_OTHER): Payer: Medicare Other | Admitting: Physician Assistant

## 2016-06-16 ENCOUNTER — Encounter: Payer: Self-pay | Admitting: Physician Assistant

## 2016-06-16 VITALS — BP 126/70 | HR 82 | Ht 74.0 in | Wt 264.0 lb

## 2016-06-16 DIAGNOSIS — R197 Diarrhea, unspecified: Secondary | ICD-10-CM

## 2016-06-16 DIAGNOSIS — K50819 Crohn's disease of both small and large intestine with unspecified complications: Secondary | ICD-10-CM

## 2016-06-16 MED ORDER — BUDESONIDE 3 MG PO CPEP: 3.0000 mg | ORAL_CAPSULE | Freq: Every day | ORAL | 3 refills | Status: DC

## 2016-06-16 NOTE — Patient Instructions (Addendum)
We have given  You samples of Uceris 9 mg. Take 1 tab daily.  We sent a prescription to [facility] [address] for Budesonide ( Entocort) 3 mg- take 3 tablets daily.  Do not pick this up at this time. Let us know if the Uceris is helping. You have enough for 24 days.  Call Carolena Fairbank at [phone] when  You are a week from being out of the Uceris and I will see if we have any more samples.   When you do pick up the prescription , let us know if it is too expensive. We may have to do a prior authorization with your insurance company.

## 2016-06-16 NOTE — Progress Notes (Signed)
Subjective:    Patient ID: Kyle Ball, male    DOB: 1946-03-01, 71 y.o.   MRN: [id number]  HPI Kyle Ball) is a pleasant 71 year old white male known to Dr. Carlean Purl with history of Parkinson's disease, anxiety, hypertension and Crohn's ileocolitis. He is status post remote ileocolonic resection. He was last seen in our office in 2015. He had undergone colonoscopy in October 2015 with findings of active Crohn's disease at the anastomosis with small ulcerations he was felt to have an inflammatory plus minus fibrotic stricture of the anastomosis. At that time he was started on budesonide 9 mg by mouth daily. Notes indicate that he did have response to the medication but that it was quite expensive, and was stopped a couple of months later. He has not been in to the office since then.  He says that he had intermittent problems with episodes of diarrhea over the past couple of years but that over the past couple of months these have become much more frequent. He had written down all of the days over the past month or so when he had episodes of urgency and incontinence of "uncontrollable" loose stools. He says this usually happens just once a day but had happened on multiple days over the past month. He says in between these episodes he doesn't have any bowel movements. He is concerned that his Parkinson's may be playing a role in this because he is not getting much warning and is unable to control the bowel movements. He has not noticed any blood. He says occasionally he'll have some mild abdominal discomfort but has not had any significant pain. He says his abdomen has been making a lot of noise. No nausea or vomiting, appetite is been fine and weight has been stable. Line Is been on a stable regimen of medications over the past year or so no recent antibiotics etc.  Review of Systems Pertinent positive and negative review of systems were noted in the above HPI section.  All other review of systems  was otherwise negative.  Outpatient Encounter Prescriptions as of 06/16/2016  Medication Sig  . aspirin 81 MG tablet Take 81 mg by mouth daily.    . AZILECT 1 MG TABS tablet   . Carbidopa-Levodopa ER (SINEMET CR) 25-100 MG tablet controlled release   . citalopram (CELEXA) 20 MG tablet Take 30 mg by mouth daily.   Marland Kitchen dutasteride (AVODART) 0.5 MG capsule Take 0.5 mg by mouth daily.  . Multiple Vitamins-Minerals (PRESERVISION AREDS PO) Take by mouth.  Marland Kitchen rOPINIRole (REQUIP) 4 MG tablet Take 4 mg by mouth at bedtime. Take 1 by mouth in the am, 1/2 by mouth mid-day, 1/2 by mouth at night.  . Tamsulosin HCl (FLOMAX) 0.4 MG CAPS Take 0.4 mg by mouth daily.    Marland Kitchen VIAGRA 100 MG tablet as needed.  Marland Kitchen atorvastatin (LIPITOR) 20 MG tablet Take 20 mg by mouth daily.    . budesonide (ENTOCORT EC) 3 MG 24 hr capsule Take 1 capsule (3 mg total) by mouth daily.  . [DISCONTINUED] tiZANidine (ZANAFLEX) 4 MG tablet Take 1 tablet (4 mg total) by mouth at bedtime.  . [DISCONTINUED] zoster vaccine live, PF, (ZOSTAVAX) 11914 UNT/0.65ML injection Inject 19,400 Units into the skin once.   No facility-administered encounter medications on file as of 06/16/2016.    Allergies  Allergen Reactions  . Penicillins     Rash age 79 with PCN injection. Because of a history of documented adverse serious drug reaction;Medi Alert bracelet  is recommended   Patient Active Problem List   Diagnosis Date Noted  . Hyperglycemia 02/04/2015  . Elevated blood pressure reading without diagnosis of hypertension 03/05/2013  . Nonspecific abnormal electrocardiogram (ECG) (EKG) 08/31/2011  . Parkinson's disease ([place]) 06/21/2010  . ANXIETY 07/01/2007  . BENIGN PROSTATIC HYPERTROPHY, HX OF 07/01/2007  . HYPERLIPIDEMIA 04/10/2007  . DISORDER, DYSMETABOLIC SYNDROME X 37/29/0211  . DISORDER, DEPRESSIVE NEC 04/10/2007  . ELEVATED PROSTATE SPECIFIC ANTIGEN 04/10/2007  . History of non anemic vitamin B12 deficiency 10/26/2006  . ERECTILE  DYSFUNCTION 10/26/2006  . Crohn's disease of ileum with intestinal obstruction ([place]) 10/26/2006   Social History   Social History  . Marital status: Single    Spouse name: N/A  . Number of children: 2  . Years of education: N/A   Occupational History  . 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 Smoking status: Never Smoker  . Smokeless tobacco: Never Used  . Alcohol use 8.4 oz/week    14 Glasses of wine per week  . Drug use: No  . Sexual activity: Not on file   Other Topics Concern  . Not on file   Social History Narrative   NO DIET    Mr. Masoud family history includes Diabetes in his father; Fibromyalgia in his sister; Heart attack (age of onset: 61) in his father; Hypertension in his father; Mental illness in his sister; Stroke (age of onset: 48) in his father; Testicular cancer (age of onset: 4) in his brother; Uterine cancer in his mother.      Objective:    Vitals:   06/16/16 1452  BP: 126/70  Pulse: 82    Physical Exam  well-developed older white male in no acute distress, pleasant, speech pattern is slowed secondary to Parkinson's. Blood pressure 126/70 pulse 82, height 6 foot 2 weight 264 BMI of 33.9. HEENT ;nontraumatic normocephalic EOMI PERRLA sclera anicteric, Cardiovascular; regular rate and rhythm with S1-S2 no murmur or gallop Pulmonary; clear bilaterally, Abdomen; obese soft is no focal tenderness no guarding or rebound bowel sounds somewhat hyperactive S or hepatosplenomegaly midline incisional scar, Rectal; exam not done, Ext; no clubbing cyanosis or edema skin warm and dry, Neuropsych ;mood and affect appropriate, typical Parkinson's features       Assessment & Plan:   #24 71 year old white male with Parkinson's with history of Crohn's ileocolitis is post remote ileocolonic resection and known active Crohn's at the anastomosis as of colonoscopy October 2015. He had previous response to  budesonide. Patient presents now with 6 week history of much more frequent episodes of urgent loose stools and incontinence. I suspect that his Crohn's is active and is the source of episodes of loose stools, his Parkinson's may be contributory from a standpoint of decreased sphincter control. #2 anxiety #3 hypertension #4 hyperlipidemia  Plan; we will restart budesonide 9 mg by mouth daily, as medication was quite expensive I have given him a few weeks worth of Uceris samples 9 mg by mouth daily. If he has response to medication, perhaps we can work on prior authorization, or if unable to obtain, consider low-dose prednisone. We'll plan office follow-up with Dr. Carlean Purl or myself in 3-4 weeks.  Amy S Esterwood PA-C 06/16/2016   Cc: Hendricks Limes, MD

## 2016-06-18 NOTE — Progress Notes (Signed)
Agree with Ms. Genia Harold assessment and plan.  Am fine with using prednisone to get acute sxs under control - note that Uceris allegedly better for left colon thatn ileum and right colon.  He has not been consistently compliant.  Longer-term tx could be ASA, immunomodulator perhaps - also consider colestipol but have some concern about obstruction depending upon state of bowel stenosis/stricture. Stricture dilation could be helpful possibly.  Will be important to see how he responds to steroids  Gatha Mayer, MD, [facility]

## 2016-06-22 ENCOUNTER — Ambulatory Visit: Payer: Medicare Other | Admitting: Gastroenterology

## 2016-06-27 DIAGNOSIS — G4752 REM sleep behavior disorder: Secondary | ICD-10-CM | POA: Diagnosis not present

## 2016-06-27 DIAGNOSIS — E785 Hyperlipidemia, unspecified: Secondary | ICD-10-CM | POA: Diagnosis not present

## 2016-06-27 DIAGNOSIS — G20 Parkinson's disease: Secondary | ICD-10-CM | POA: Diagnosis not present

## 2016-06-27 DIAGNOSIS — Z880 Allergy status to penicillin: Secondary | ICD-10-CM | POA: Diagnosis not present

## 2016-06-27 DIAGNOSIS — Z79899 Other long term (current) drug therapy: Secondary | ICD-10-CM | POA: Diagnosis not present

## 2016-06-27 DIAGNOSIS — G4733 Obstructive sleep apnea (adult) (pediatric): Secondary | ICD-10-CM | POA: Diagnosis not present

## 2016-07-05 DIAGNOSIS — Z9989 Dependence on other enabling machines and devices: Secondary | ICD-10-CM | POA: Diagnosis not present

## 2016-07-05 DIAGNOSIS — G4733 Obstructive sleep apnea (adult) (pediatric): Secondary | ICD-10-CM | POA: Diagnosis not present

## 2016-07-05 DIAGNOSIS — Z79899 Other long term (current) drug therapy: Secondary | ICD-10-CM | POA: Diagnosis not present

## 2016-07-05 DIAGNOSIS — R2689 Other abnormalities of gait and mobility: Secondary | ICD-10-CM | POA: Diagnosis not present

## 2016-07-05 DIAGNOSIS — G20 Parkinson's disease: Secondary | ICD-10-CM | POA: Diagnosis not present

## 2016-07-10 ENCOUNTER — Encounter: Payer: Self-pay | Admitting: Internal Medicine

## 2016-07-10 ENCOUNTER — Ambulatory Visit (INDEPENDENT_AMBULATORY_CARE_PROVIDER_SITE_OTHER): Payer: Medicare Other | Admitting: Internal Medicine

## 2016-07-10 VITALS — BP 132/70 | HR 62 | Ht 74.0 in | Wt 264.0 lb

## 2016-07-10 DIAGNOSIS — K50012 Crohn's disease of small intestine with intestinal obstruction: Secondary | ICD-10-CM | POA: Diagnosis not present

## 2016-07-10 DIAGNOSIS — Z9049 Acquired absence of other specified parts of digestive tract: Secondary | ICD-10-CM | POA: Diagnosis not present

## 2016-07-10 MED ORDER — BUDESONIDE 3 MG PO CPEP: 9.0000 mg | ORAL_CAPSULE | Freq: Every day | ORAL | 1 refills | Status: DC

## 2016-07-10 NOTE — Progress Notes (Signed)
   Kyle Ball 71 y.o. [birth year] [id number]  Assessment & Plan:   Encounter Diagnoses  Name Primary?  . Crohn's disease of ileum with intestinal obstruction ([place])   . S/P right hemicolectomy Yes    He seems better somewhat - not sure he has had enough time on budesonide yert - will tx 9 mg budesonide daily and see me 6-8 weeks If too expensive would use low dose prednisone. At reassessment consider longer term tx w/ ASA vs immunosuppressant. Anotyher option would be to consider bile salt binder given ileo-colonic resection He has done fairly well over the years with intermittent medical Tx after resection. Could need repeat colonoscopy if sxs not sig better.    Subjective:   Chief Complaint: diarrhea and Crohn's disease  HPI Here for f/u - saw Amy Esterwood, PA-C few weks ago - started on budesonide for diarrhea with urgency and fecal incontinence. Says he is somewhat better at this point.  Medications, allergies, past medical history, past surgical history, family history and social history are reviewed and updated in the EMR.  Review of Systems As above, Parkinson's slows him down  Objective:   Physical Exam BP 132/70   Pulse 62   Ht 6' 2"  (1.88 m)   Wt 264 lb (119.7 kg)   BMI 33.90 kg/m   Masked facies Bradykinetic Abd obese soft nontender w/ midline ventral hernia  15 minutes time spent with patient > half in counseling coordination of care

## 2016-07-10 NOTE — Patient Instructions (Signed)
We have sent the following medications to your pharmacy for you to pick up at your convenience: Budesonide 9 mg   We have made you a follow up to see Silvano Rusk, MD on 09/07/16 at 1:30 om.  Thank you for choosing me and [facility].  Gatha Mayer, M.D., [facility]

## 2016-07-10 NOTE — Assessment & Plan Note (Signed)
Budesonide vs low dose prednisone

## 2016-07-11 ENCOUNTER — Encounter: Payer: Self-pay | Admitting: Internal Medicine

## 2016-07-19 ENCOUNTER — Telehealth: Payer: Self-pay | Admitting: Internal Medicine

## 2016-07-19 NOTE — Telephone Encounter (Signed)
Please advise Sir, thank you. 

## 2016-07-20 MED ORDER — PREDNISONE 5 MG PO TABS: ORAL_TABLET | ORAL | 1 refills | Status: DC

## 2016-07-20 NOTE — Telephone Encounter (Signed)
Patient informed of the medicine change and I confirmed the pharmacy. Faxed rx to the pharmacy.

## 2016-07-20 NOTE — Telephone Encounter (Signed)
Do this instead:  Prednisone 5 mg tabs  Take 4 dailyx 2 weeks Take 3 daily x 2 weeks Take 2 daily x 2 weeks Take 1 daily until follow-up visit # 150 w/ 1 RF

## 2016-07-27 ENCOUNTER — Ambulatory Visit: Payer: Medicare Other | Attending: Nurse Practitioner | Admitting: Physical Therapy

## 2016-07-27 DIAGNOSIS — R2689 Other abnormalities of gait and mobility: Secondary | ICD-10-CM | POA: Diagnosis not present

## 2016-07-27 DIAGNOSIS — M6281 Muscle weakness (generalized): Secondary | ICD-10-CM | POA: Insufficient documentation

## 2016-07-27 DIAGNOSIS — R29818 Other symptoms and signs involving the nervous system: Secondary | ICD-10-CM | POA: Insufficient documentation

## 2016-07-28 NOTE — Therapy (Signed)
[facility] [address] [place], [place], [address] Phone: [phone]   Fax:  [phone]  Physical Therapy Evaluation  Patient Details  Name: Kyle Ball MRN: [id number] Date of Birth: Mar 03, 1946 Referring Provider: Pura Spice, NP  Encounter Date: 07/27/2016      PT End of Session - 07/28/16 1317    Visit Number 1   Number of Visits 9   Date for PT Re-Evaluation 09/25/16   Authorization Type Medicare primary, BCBS 2nd-GCODE every 10th visit   PT Start Time 1020   PT Stop Time 1103   PT Time Calculation (min) 43 min   Activity Tolerance Patient tolerated treatment well   Behavior During Therapy [facility] for tasks assessed/performed      Past Medical History:  Diagnosis Date  . Anxiety and depression   . BPH (benign prostatic hypertrophy)   . Crohn's disease ([place]) 1979   After resection, 1 flare in 2012.  Marland Kitchen Dysmetabolic syndrome X   . ED (erectile dysfunction)   . Hyperlipidemia   . Internal hemorrhoids   . Parkinson's disease ([place])    [facility]    Past Surgical History:  Procedure Laterality Date  . COLONOSCOPY W/ BIOPSIES   06/2007, 2015   Crohn's, internal hemorrhoids, Dr Carlean Purl  . KNEE ARTHROSCOPY Right 06/2013  . PROSTATE BIOPSY     X 2, Dr Risa Grill  . QUADRICEPS REPAIR Right 1966    knee  . REFRACTIVE SURGERY  2015   Dr Bing Plume  . Terminal Ileum & Appendix Resected  1995   Crohn's    There were no vitals filed for this visit.       Subjective Assessment - 07/27/16 1026    Subjective Pt reports slight changes in ability to transfer, especially lower and softer sufaces.  He feels he doesn't have the leg strength.  He wants to work on balance, as he notices he has caught himself several times (on steps or changes in elevation).  He has not had any falls and he does not use assistive device.   Patient Stated Goals Pt's goal for therapy is to get exercises to help getting up from low/soft  surfaces and to avoid dragging feet.   Currently in Pain? No/denies            [facility] PT Assessment - 07/27/16 1029      Assessment   Medical Diagnosis Parkinson's disease   Referring Provider Pura Spice, NP   Onset Date/Surgical Date 06/27/16  neurologist visit     Precautions   Precautions Fall     Balance Screen   Has the patient fallen in the past 6 months No   Has the patient had a decrease in activity level because of a fear of falling?  No   Is the patient reluctant to leave their home because of a fear of falling?  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, [employer]; enjoys  going out to eat with wife     Observation/Other Assessments   Focus on Therapeutic Outcomes (FOTO)  NA     Posture/Postural Control   Posture/Postural Control Postural limitations   Postural Limitations Forward head;Rounded Shoulders     Tone   Assessment Location Right Lower Extremity;Left Lower Extremity     ROM / Strength   AROM / PROM / Strength Strength;PROM     PROM   Overall PROM Comments mild increase in tone noted with P/ROM     Strength   Overall Strength Comments Grossly tested at least 4 to 5/5 bilateral lower extremities     Transfers   Transfers Sit to Stand;Stand to Sit   Sit to Stand 6: Modified independent (Device/Increase time);Without upper extremity assist;From chair/3-in-1   Five time sit to stand comments  11.44   Stand to Sit Without upper extremity  assist;To chair/3-in-1   Comments Pt describes difficulty with low surface, softer surface transfers     Ambulation/Gait   Ambulation/Gait Yes   Ambulation/Gait Assistance 7: Independent   Ambulation Distance (Feet) 300 Feet   Assistive device None   Gait Pattern Step-through pattern;Decreased arm swing - right;Decreased arm swing - left;Decreased step length - right;Decreased step length - left;Decreased trunk rotation;Poor foot clearance - left;Poor foot clearance - right  Forward flexed posture   Ambulation Surface Level;Indoor   Gait velocity 10.75 sec = 3.05 ft/sec   Stairs Yes   Stairs Assistance 6: Modified independent (Device/Increase time)   Stair Management Technique Two rails;Alternating pattern  Pt does not place full foot on step   Number of Stairs 4   Height of Stairs 6     Standardized Balance Assessment   Standardized Balance Assessment Timed Up and Go Test     Timed Up and Go Test   Normal TUG (seconds) 10.75   Manual TUG (seconds) 11.75   Cognitive TUG (seconds) 10.19   TUG Comments Scores >13.5 seconds indicate increased fall risk     High Level Balance   High Level Balance Comments MiniBESTest:  24/28     RLE Tone   RLE Tone Mild     LLE Tone   LLE Tone Mild                                PT Long Term Goals - 07/28/16 1321      PT LONG TERM GOAL #1   Title Pt will be independent with HEP for improved transfers, gait and balance.  TARGET 08/25/16   Time 4   Period Weeks   Status New     PT LONG TERM GOAL #2   Title Pt will perform at least 8 of 10 reps sit<>stand transfers, no UE support for improved transfer efficiency and safety.   Time 4   Period Weeks   Status New     PT LONG TERM GOAL #3   Title Sensory Organization test to be performed, with goal to be written as appropriate.   Time 4   Period Weeks   Status New     PT LONG TERM GOAL #4   Title Pt will ambulate at least 1000 ft, outdoor surfaces, no LOB, for  improved varied surface negotiation.   Time 4   Period Weeks   Status New     PT LONG TERM GOAL #5   Title Pt will negotiate at least 4 steps, 4 reps, with  handrail, no incidence of foot catching on step, independently, for improved safety with stair negotiation.   Time 4   Period Weeks   Status New     Additional Long Term Goals   Additional Long Term Goals Yes     PT LONG TERM GOAL #6   Title Pt will verbalize understanding of fall prevention in home environment.    Time 4   Period Weeks   Status New               Plan - 08-13-2016 1317    Clinical Impression Statement Pt is a 71 year old male who presents to OP PT with history of Parkinson's disease, who reports difficulty with low surface transfers, foot clearance with steps and with changes of surfaces.  Pt presents to OP PT with decreased timing and coordination of gait, abnormal posture, decreased functional strength, decreased dynamic balance, tremors and rigidity.  Pt will benefit from skilled physical therapy to address the above stated deficits for improved functional mobility and decreased fall risk.   Rehab Potential Good   PT Frequency 2x / week   PT Duration 4 weeks  plus eval   PT Treatment/Interventions ADLs/Self Care Home Management;Functional mobility training;Stair training;Gait training;Therapeutic activities;Therapeutic exercise;Balance training;Neuromuscular re-education;Patient/family education   PT Next Visit Plan Perform Sensory Organization test; work on intensity of gait-foot clearance, posture, arm swing (may try treadmill); sit<>stand from lower surfaces; ankle/hip/step strategy   Consulted and Agree with Plan of Care Patient      Patient will benefit from skilled therapeutic intervention in order to improve the following deficits and impairments:  Abnormal gait, Decreased balance, Decreased strength, Postural dysfunction  Visit Diagnosis: Other abnormalities of gait and mobility  Other  symptoms and signs involving the nervous system  Muscle weakness (generalized)      G-Codes - 08/13/2016 1326    Functional Assessment Tool Used 5x sit<>stand 11.44 sec, with reported difficulty from low surface transfers; reported unsteadiness on compliant surfaces (increased sway on compliant surface with EC)   Functional Limitation Mobility: Walking and moving around   Mobility: Walking and Moving Around Current Status [id number]) At least 20 percent but less than 40 percent impaired, limited or restricted   Mobility: Walking and Moving Around Goal Status [phone]) At least 1 percent but less than 20 percent impaired, limited or restricted       Problem List Patient Active Problem List   Diagnosis Date Noted  . Hyperglycemia 02/04/2015  . Elevated blood pressure reading without diagnosis of hypertension 03/05/2013  . Nonspecific abnormal electrocardiogram (ECG) (EKG) 08/31/2011  . Parkinson's disease ([place]) 06/21/2010  . ANXIETY 07/01/2007  . BENIGN PROSTATIC HYPERTROPHY, HX OF 07/01/2007  . HYPERLIPIDEMIA 04/10/2007  . DISORDER, DYSMETABOLIC SYNDROME X [id number]  . DISORDER, DEPRESSIVE NEC 04/10/2007  . ELEVATED PROSTATE SPECIFIC ANTIGEN 04/10/2007  . History of non anemic vitamin B12 deficiency 10/26/2006  . ERECTILE DYSFUNCTION 10/26/2006  . Crohn's disease of ileum with intestinal obstruction ([place]) 10/26/2006    Mayan Kloepfer W. 08/13/2016, 1:28 PM Mady Haagensen, PT August 13, 2016 1:29 PM Phone: [phone] Fax: [facility] [address] [place], [place], [address] Phone: [phone]   Fax:  [phone]  Name: Kyle Ball MRN: [id number] Date of Birth: August 06, 1945

## 2016-07-31 ENCOUNTER — Ambulatory Visit: Payer: Medicare Other | Admitting: Physical Therapy

## 2016-07-31 DIAGNOSIS — R2689 Other abnormalities of gait and mobility: Secondary | ICD-10-CM | POA: Diagnosis not present

## 2016-07-31 DIAGNOSIS — R29818 Other symptoms and signs involving the nervous system: Secondary | ICD-10-CM | POA: Diagnosis not present

## 2016-07-31 DIAGNOSIS — M6281 Muscle weakness (generalized): Secondary | ICD-10-CM

## 2016-07-31 NOTE — Therapy (Signed)
[facility] [address] [place], [place], [address] Phone: [phone]   Fax:  [phone]  Physical Therapy Treatment  Patient Details  Name: Kyle Ball MRN: [id number] Date of Birth: 01-07-1946 Referring Provider: Pura Spice, NP  Encounter Date: 07/31/2016      PT End of Session - 07/31/16 1934    Visit Number 2   Number of Visits 9   Date for PT Re-Evaluation 09/25/16   Authorization Type Medicare primary, BCBS 2nd-GCODE every 10th visit   PT Start Time 1017   PT Stop Time 1105   PT Time Calculation (min) 48 min   Activity Tolerance Patient tolerated treatment well   Behavior During Therapy [facility] for tasks assessed/performed      Past Medical History:  Diagnosis Date  . Anxiety and depression   . BPH (benign prostatic hypertrophy)   . Crohn's disease ([place]) 1979   After resection, 1 flare in 2012.  Marland Kitchen Dysmetabolic syndrome X   . ED (erectile dysfunction)   . Hyperlipidemia   . Internal hemorrhoids   . Parkinson's disease ([place])    [facility]    Past Surgical History:  Procedure Laterality Date  . COLONOSCOPY W/ BIOPSIES   06/2007, 2015   Crohn's, internal hemorrhoids, Dr Carlean Purl  . KNEE ARTHROSCOPY Right 06/2013  . PROSTATE BIOPSY     X 2, Dr Risa Grill  . QUADRICEPS REPAIR Right 1966    knee  . REFRACTIVE SURGERY  2015   Dr Bing Plume  . Terminal Ileum & Appendix Resected  1995   Crohn's    There were no vitals filed for this visit.      Subjective Assessment - 07/31/16 1022    Subjective Denies falls. Reports he does not ever drag his feet to the point of catching his toe and tripping.    Patient Stated Goals Pt's goal for therapy is to get exercises to help getting up from low/soft surfaces and to avoid dragging feet.   Currently in Pain? No/denies                Vestibular Assessment - 07/31/16 1917      Balancemaster   Therapist, occupational Comment Composite score 83 out of 100 (age-based normal= ~70); Somatosensory ~95/100 (age-based norm=~93); Vision ~97/100 (age-based normal= ~82); Vestibular ~83/100 (age-based normal ~54); Patient noted to favor ankle strategy over hip strategy to maintain balance                 [facility] Adult PT Treatment/Exercise - 07/31/16 1926      Ambulation/Gait   Ambulation/Gait Yes   Ambulation/Gait Assistance 5: Supervision   Ambulation/Gait Assistance Details vc for incr step length, heel to toe progression, incr velocity, incr arm swing   Ambulation Distance (Feet) 110 Feet  plus 7 minutes on treadmill   Assistive device None   Gait Pattern Step-through pattern;Decreased arm swing - right;Decreased arm swing - left;Decreased step length - right;Decreased step length - left;Decreased trunk rotation;Poor foot clearance - left;Poor foot clearance - right   Ambulation Surface Level;Indoor;Other (comment)  treadmill     Exercises   Exercises Knee/Hip     Knee/Hip Exercises: Aerobic   Tread Mill x 7 minutes; speed 1.21mh with vc for incr step length and heelstrike; pt required bil UE support throughout                PT Education - 07/31/16 1931    Education provided Yes  Education Details Educated on walking program to dedicate 3 minutes/ 3 times per day to focus on quality of gait (step length, arm swing, heel strike, velocity)   Person(s) Educated Patient   Methods Explanation;Demonstration;Verbal cues;Handout   Comprehension Verbalized understanding;Returned demonstration;Need further instruction;Verbal cues required             PT Long Term Goals - 07/31/16 1942      PT LONG TERM GOAL #1   Title Pt will be independent with HEP for improved transfers, gait and balance.  TARGET 08/25/16   Time 4   Period Weeks   Status New     PT LONG TERM GOAL #2   Title Pt will perform at least 8 of 10 reps sit<>stand transfers, no UE support for improved transfer  efficiency and safety.   Time 4   Period Weeks   Status New     PT LONG TERM GOAL #3   Title Sensory Organization test to be performed, with goal to be written as appropriate. MET 07/31/16; Revised goal: Patient will demonstrate ability to use hip strategy vs step strategy in situations that are not appropriate for ankle strategy.    Baseline 07/31/16 SOT completed; Composite 83/100, Somatosensory ~95, Vision ~97, Vestibular ~83; ankle strategy preferred over hip strategy   Time 4   Period Weeks   Status New     PT LONG TERM GOAL #4   Title Pt will ambulate at least 1000 ft, outdoor surfaces, no LOB, for improved varied surface negotiation.   Time 4   Period Weeks   Status New     PT LONG TERM GOAL #5   Title Pt will negotiate at least 4 steps, 4 reps, with handrail, no incidence of foot catching on step, independently, for improved safety with stair negotiation.   Time 4   Period Weeks   Status New     PT LONG TERM GOAL #6   Title Pt will verbalize understanding of fall prevention in home environment.    Time 4   Period Weeks   Status New               Plan - 07/31/16 1936    Clinical Impression Statement Skilled session focused on completing Sensory Organization Test of balance with pt demonstrating above average scores when compared to other 32-49 year olds. Of note, he primarily uses ankle strategy to maintain his balance and can benefit from training hip strategy. Gait training completed on treadmill and over indoor, level surfaces with changes focused on improving foot clearance for incr safety. Patient educated to perform walking program as part of his HEP.    Rehab Potential Good   PT Frequency 2x / week   PT Duration 4 weeks  plus eval   PT Treatment/Interventions ADLs/Self Care Home Management;Functional mobility training;Stair training;Gait training;Therapeutic activities;Therapeutic exercise;Balance training;Neuromuscular re-education;Patient/family education    PT Next Visit Plan give HEP: sit to stand, staggered stance ant-post weight shifts, work on intensity of gait-foot clearance, posture, arm swing; sit<>stand from lower surfaces; ankle/hip/step strategy (pt used ankle>hip on SOT)   PT Home Exercise Plan walking intentionally 3 minutes, 3x/day   Consulted and Agree with Plan of Care Patient      Patient will benefit from skilled therapeutic intervention in order to improve the following deficits and impairments:  Abnormal gait, Decreased balance, Decreased strength, Postural dysfunction  Visit Diagnosis: Other abnormalities of gait and mobility  Other symptoms and signs involving the nervous system  Muscle  weakness (generalized)     Problem List Patient Active Problem List   Diagnosis Date Noted  . Hyperglycemia 02/04/2015  . Elevated blood pressure reading without diagnosis of hypertension 03/05/2013  . Nonspecific abnormal electrocardiogram (ECG) (EKG) 08/31/2011  . Parkinson's disease ([place]) 06/21/2010  . ANXIETY 07/01/2007  . BENIGN PROSTATIC HYPERTROPHY, HX OF 07/01/2007  . HYPERLIPIDEMIA 04/10/2007  . DISORDER, DYSMETABOLIC SYNDROME X 26/58/7184  . DISORDER, DEPRESSIVE NEC 04/10/2007  . ELEVATED PROSTATE SPECIFIC ANTIGEN 04/10/2007  . History of non anemic vitamin B12 deficiency 10/26/2006  . ERECTILE DYSFUNCTION 10/26/2006  . Crohn's disease of ileum with intestinal obstruction ([place]) 10/26/2006    Rexanne Mano, PT 07/31/2016, 7:58 PM  [facility] [address] [place], [place], [address] Phone: [phone]   Fax:  [phone]  Name: Kyle Ball MRN: [id number] Date of Birth: Jun 14, 1945

## 2016-07-31 NOTE — Patient Instructions (Signed)
WALKING  Walking is a great form of exercise to increase your strength, endurance and overall fitness.  A walking program can help you start slowly and gradually build endurance as you go.  Everyone's ability is different, so each person's starting point will be different.  You do not have to follow them exactly.  The are just samples. You should simply find out what's right for you and stick to that program.   In the beginning, you'll start off walking 2-3 times a day for short distances.  As you get stronger, you'll be walking further at just 1-2 times per day.  A. You Can Walk For A Certain Length Of Time Each Day    Walk 3 minutes 3 times per day.  Increase -- minutes every -- days (3 times per day).  Work up to 25-30 minutes (1-2 times per day).   Example:   Day 1-2 -- minutes 3 times per day   Day 7-8 -- minutes 2-3 times per day   Day 13-14 -- minutes 1-2 times per day  B. You Can Walk For a Certain Distance Each Day     Distance can be substituted for time.    Example:   3 trips to mailbox (at road)   3 trips to corner of block   3 trips around the block  C. Go to local [employer] and use the track.    Walk for distance ____ around track  Or time ____ minutes    Please only do the exercises that your therapist has initialed and dated

## 2016-08-02 ENCOUNTER — Ambulatory Visit: Payer: Medicare Other | Admitting: Physical Therapy

## 2016-08-02 ENCOUNTER — Encounter: Payer: Self-pay | Admitting: Physical Therapy

## 2016-08-02 DIAGNOSIS — R29818 Other symptoms and signs involving the nervous system: Secondary | ICD-10-CM | POA: Diagnosis not present

## 2016-08-02 DIAGNOSIS — R2689 Other abnormalities of gait and mobility: Secondary | ICD-10-CM | POA: Diagnosis not present

## 2016-08-02 DIAGNOSIS — M6281 Muscle weakness (generalized): Secondary | ICD-10-CM | POA: Diagnosis not present

## 2016-08-02 NOTE — Therapy (Signed)
[facility] [address] [place], [place], [address] Phone: [phone]   Fax:  [phone]  Physical Therapy Treatment  Patient Details  Name: Kyle Ball MRN: [id number] Date of Birth: May 19, 1946 Referring Provider: Pura Spice, NP  Encounter Date: 08/02/2016      PT End of Session - 08/02/16 1552    Visit Number 3   Number of Visits 9   Date for PT Re-Evaluation 09/25/16   Authorization Type Medicare primary, BCBS 2nd-GCODE every 10th visit   PT Start Time 0845   PT Stop Time 0932   PT Time Calculation (min) 47 min   Equipment Utilized During Treatment Other (comment)  pt's belt   Activity Tolerance Patient tolerated treatment well   Behavior During Therapy  [facility] for tasks assessed/performed      Past Medical History:  Diagnosis Date  . Anxiety and depression   . BPH (benign prostatic hypertrophy)   . Crohn's disease ([place]) 1979   After resection, 1 flare in 2012.  Marland Kitchen Dysmetabolic syndrome X   . ED (erectile dysfunction)   . Hyperlipidemia   . Internal hemorrhoids   . Parkinson's disease ([place])    [facility]    Past Surgical History:  Procedure Laterality Date  . COLONOSCOPY W/ BIOPSIES   06/2007, 2015   Crohn's, internal hemorrhoids, Dr Carlean Purl  . KNEE ARTHROSCOPY Right 06/2013  . PROSTATE BIOPSY     X 2, Dr Risa Grill  . QUADRICEPS REPAIR Right 1966    knee  . REFRACTIVE SURGERY  2015   Dr Bing Plume  . Terminal Ileum & Appendix Resected  1995   Crohn's    There were no vitals filed for this visit.      Subjective Assessment - 08/02/16 0849    Subjective Denies LOB, near falls. Reports did walking several times yesterday, able to maintain velocity.    Patient Stated Goals Pt's goal for therapy is to get exercises to help getting up from low/soft surfaces and to avoid dragging feet.   Currently in Pain? Yes   Pain Score 2    Pain Location Knee   Pain Orientation Right   Pain Descriptors /  Indicators Other (Comment)  stiffness   Pain Type Chronic pain   Pain Onset More than a month ago  quad tendon rupture in past   Pain Frequency Intermittent   Pain Relieving Factors uses knee brace/wrap   Effect of Pain on Daily Activities effects his walking; ability to cross his rt ankle over his left knee   Multiple Pain Sites No                         [facility] Adult PT Treatment/Exercise - 08/02/16 1539      Transfers   Transfers Sit to Stand;Stand to Sit   Sit to Stand 5: Supervision;Without upper extremity assist;Other (comment)  from 16" box with thin pillow   Stand to Sit 6: Modified independent (Device/Increase time);Without upper extremity assist   Number of Reps 10 reps;Other sets (comment)  16", 18" x2 sets   Comments pt with tendency to plant feet 18"-20" apart and uses momentum; from 18" surface educated to get to edge and keep feet hip width     Ambulation/Gait   Ambulation/Gait Yes   Ambulation/Gait Assistance 5: Supervision   Ambulation/Gait Assistance Details vc for incr velocity, step length, heelstrike, arm swing   Ambulation Distance (Feet) 330 Feet  110   Assistive  device None   Gait Pattern Step-through pattern;Decreased arm swing - right;Decreased arm swing - left;Decreased step length - right;Decreased step length - left;Decreased trunk rotation;Poor foot clearance - left;Poor foot clearance - right   Ambulation Surface Level;Indoor     Posture/Postural Control   Posture/Postural Control Postural limitations   Postural Limitations Forward head;Rounded Shoulders     Exercises   Exercises Knee/Hip;Ankle     Knee/Hip Exercises: Standing   Hip Flexion AROM;Both;1 set;20 reps;Knee bent  alternating legs, big arm swing   Other Standing Knee Exercises stand sideways at counter, leg closest to counter swings forward (touch heel down) and back (touch toe) and then add opposite arm swing (BIG). Then do same side arm swing only. x 20 reps each  arm with each leg                PT Education - 08/02/16 1551    Education provided Yes   Education Details in HEP (see instructions)   Person(s) Educated Patient   Methods Explanation;Demonstration;Verbal cues;Handout   Comprehension Verbalized understanding;Returned demonstration;Verbal cues required;Need further instruction             PT Long Term Goals - 07/31/16 1942      PT LONG TERM GOAL #1   Title Pt will be independent with HEP for improved transfers, gait and balance.  TARGET 08/25/16   Time 4   Period Weeks   Status New     PT LONG TERM GOAL #2   Title Pt will perform at least 8 of 10 reps sit<>stand transfers, no UE support for improved transfer efficiency and safety.   Time 4   Period Weeks   Status New     PT LONG TERM GOAL #3   Title Sensory Organization test to be performed, with goal to be written as appropriate. MET 07/31/16; Revised goal: Patient will demonstrate ability to use hip strategy vs step strategy in situations that are not appropriate for ankle strategy.    Baseline 07/31/16 SOT completed; Composite 83/100, Somatosensory ~95, Vision ~97, Vestibular ~83; ankle strategy preferred over hip strategy   Time 4   Period Weeks   Status New     PT LONG TERM GOAL #4   Title Pt will ambulate at least 1000 ft, outdoor surfaces, no LOB, for improved varied surface negotiation.   Time 4   Period Weeks   Status New     PT LONG TERM GOAL #5   Title Pt will negotiate at least 4 steps, 4 reps, with handrail, no incidence of foot catching on step, independently, for improved safety with stair negotiation.   Time 4   Period Weeks   Status New     PT LONG TERM GOAL #6   Title Pt will verbalize understanding of fall prevention in home environment.    Time 4   Period Weeks   Status New               Plan - 08/02/16 1553    Clinical Impression Statement Skilled PT session focused on instruction in HEP for strengthening legs and working on  arm swing for gait (to improve balance). Patient able to carryover proper coordination of arm swing for up to 75 ft at a time. Instructed when arms get out of sync to stop and restart. Pt with difficulty recognizing arms out of sync and required cues to stop. Cues for incr back swing of bil arms (not just forward swing).    Rehab  Potential Good   PT Frequency 2x / week   PT Duration 4 weeks  plus eval   PT Treatment/Interventions ADLs/Self Care Home Management;Functional mobility training;Stair training;Gait training;Therapeutic activities;Therapeutic exercise;Balance training;Neuromuscular re-education;Patient/family education   PT Next Visit Plan assess HEP, add posture exercise to HEP (?forward lunge with arms open wide or seated PWR up); work on intensity of gait-foot clearance, posture, arm swing; sit<>stand from lower surfaces; ankle/hip/step strategy (pt used ankle>hip on SOT)   PT Home Exercise Plan walking intentionally 3 minutes, 3x/day; sit to stand; high marching with big arm swing; leg swings with opposite arm swing   Consulted and Agree with Plan of Care Patient      Patient will benefit from skilled therapeutic intervention in order to improve the following deficits and impairments:  Abnormal gait, Decreased balance, Decreased strength, Postural dysfunction  Visit Diagnosis: Other abnormalities of gait and mobility  Other symptoms and signs involving the nervous system     Problem List Patient Active Problem List   Diagnosis Date Noted  . Hyperglycemia 02/04/2015  . Elevated blood pressure reading without diagnosis of hypertension 03/05/2013  . Nonspecific abnormal electrocardiogram (ECG) (EKG) 08/31/2011  . Parkinson's disease ([place]) 06/21/2010  . ANXIETY 07/01/2007  . BENIGN PROSTATIC HYPERTROPHY, HX OF 07/01/2007  . HYPERLIPIDEMIA 04/10/2007  . DISORDER, DYSMETABOLIC SYNDROME X 03/49/1791  . DISORDER, DEPRESSIVE NEC 04/10/2007  . ELEVATED PROSTATE SPECIFIC ANTIGEN  04/10/2007  . History of non anemic vitamin B12 deficiency 10/26/2006  . ERECTILE DYSFUNCTION 10/26/2006  . Crohn's disease of ileum with intestinal obstruction ([place]) 10/26/2006    Rexanne Mano, PT 08/02/2016, 4:02 PM  [facility] [address] [place], [place], [address] Phone: [phone]   Fax:  [phone]  Name: MURICE BARBAR MRN: [id number] Date of Birth: August 03, 1945

## 2016-08-02 NOTE — Patient Instructions (Addendum)
Functional Quadriceps: Sit to Stand    Sit on edge of chair, feet flat on floor. Stand upright, extending knees fully. Repeat __10__ times per set. Do __2__ sets per session. Do ___1_ sessions per day.  http://orth.exer.us/734   Copyright  VHI. All rights reserved.  Sit to Stand Transfers:  1. Scoot out to the edge of the chair 2. Place your feet flat on the floor, shoulder width apart.  Make sure your feet are tucked just under your knees. 3. Lean forward (nose over toes) with momentum, and stand up tall with your best posture.  If you need to use your arms, use them as a quick boost up to stand. 4. If you are in a low or soft chair, you can lean back and then forward up to stand, in order to get more momentum. 5. Once you are standing, make sure you are looking ahead and standing tall.  To sit down:  1. Back up until you feel the chair behind your legs. 2. Bend at you hips, reaching  Back for you chair, if needed, then slowly squat to sit down on your chair.     "I love a Database administrator    Stand beside counter (for support if necessary), march in place slowly (pause for 1 sec) as high as you can. Emphasize arm swing in opposition. Open hands Repeat __20__ times. Do __1__ sessions per day.  http://gt2.exer.us/344   Copyright  VHI. All rights reserved.   Arm Swing Roll    Standing beside counter. Left hand on counter. Swing left leg forward (touch your heel) and backward (touch your toe). Swing as quickly as you can and then add Right arm swing (as leg goes forward, arm goes forward). Do 20 reps and then switch to other side.    Copyright  VHI. All rights reserved.

## 2016-08-07 ENCOUNTER — Encounter: Payer: Self-pay | Admitting: Physical Therapy

## 2016-08-07 ENCOUNTER — Ambulatory Visit: Payer: Medicare Other | Admitting: Physical Therapy

## 2016-08-07 DIAGNOSIS — M6281 Muscle weakness (generalized): Secondary | ICD-10-CM

## 2016-08-07 DIAGNOSIS — R2689 Other abnormalities of gait and mobility: Secondary | ICD-10-CM | POA: Diagnosis not present

## 2016-08-07 DIAGNOSIS — R29818 Other symptoms and signs involving the nervous system: Secondary | ICD-10-CM | POA: Diagnosis not present

## 2016-08-07 NOTE — Therapy (Signed)
[facility] [address] [place], [place], [address] Phone: [phone]   Fax:  [phone]  Physical Therapy Treatment  Patient Details  Name: Kyle Ball MRN: [id number] Date of Birth: 01/01/1946 Referring Provider: Pura Spice, NP  Encounter Date: 08/07/2016      PT End of Session - 08/07/16 1448    Visit Number 4   Number of Visits 9   Date for PT Re-Evaluation 09/25/16   Authorization Type Medicare primary, BCBS 2nd-GCODE every 10th visit   PT Start Time 1400   PT Stop Time 1438   PT Time Calculation (min) 38 min   Equipment Utilized During Treatment Other (comment)  pt's belt   Activity Tolerance Patient tolerated treatment well   Behavior During Therapy [facility] for tasks assessed/performed      Past Medical History:  Diagnosis Date  . Anxiety and depression   . BPH (benign prostatic hypertrophy)   . Crohn's disease ([place]) 1979   After resection, 1 flare in 2012.  Marland Kitchen Dysmetabolic syndrome X   . ED (erectile dysfunction)   . Hyperlipidemia   . Internal hemorrhoids   . Parkinson's disease ([place])    [facility]    Past Surgical History:  Procedure Laterality Date  . COLONOSCOPY W/ BIOPSIES   06/2007, 2015   Crohn's, internal hemorrhoids, Dr Carlean Purl  . KNEE ARTHROSCOPY Right 06/2013  . PROSTATE BIOPSY     X 2, Dr Risa Grill  . QUADRICEPS REPAIR Right 1966    knee  . REFRACTIVE SURGERY  2015   Dr Bing Plume  . Terminal Ileum & Appendix Resected  1995   Crohn's    There were no vitals filed for this visit.      Subjective Assessment - 08/07/16 1400    Subjective Pt worked on squats and LE lifts over the weekend. Back is sore due [place]   Patient Stated Goals Pt's goal for therapy is to get exercises to help getting up from low/soft surfaces and to avoid dragging feet.   Currently in Pain? Yes   Pain Score 5    Pain Location Back   Pain Orientation Lower    Pain Descriptors / Indicators Sore   Pain Type Acute pain   Pain Onset Yesterday     Pain Frequency Occasional   Aggravating Factors  Bending over, first getting up.                         [facility] Adult PT Treatment/Exercise - 08/07/16 0001      Transfers   Transfers Sit to Stand;Stand to Sit   Sit to Stand 5: Supervision;Without upper extremity assist;   Sit to Stand Details Verbalcues for weight shifting   Sit to Stand Details (indicate cue type and reason) practised STS from low surface without UE support     Ambulation/Gait   Ambulation/Gait Yes   Ambulation/Gait Assistance 5: Supervision   Ambulation/Gait Assistance Details cues to carry over heel/toe gait and reciprocal UE swing   Ambulation Distance (Feet) 100 Feet   Assistive device None   Gait Pattern Step-through pattern;Decreased arm swing - right;Decreased arm swing - left;Decreased step length - right;Decreased step length - left;Decreased trunk rotation;Poor foot clearance - left;Poor foot clearance - right   Ambulation Surface Level;Indoor             Balance Exercises - 08/07/16 1605  Balance Exercises: Standing   Marching Limitations working on holding SLS longer and adding forward movement with intermittent UE support   Other Standing Exercises Performed HEP given 08/02/16 with intermittent UE support, supervision level, and cues for technique; see pt instruction.           PT Education - 08/07/16 1425    Education provided Yes   Education Details Performed and reviewed HEP given 08/02/16 with min cues for technique.  Organized HEP folders for greater functional access (pt has all PWR! moves currently)   Person(s) Educated Patient   Methods Explanation;Demonstration;Verbal cues;Handout   Comprehension Verbalized understanding;Returned demonstration;Verbal cues required;Need further instruction             PT Long Term Goals - 07/31/16 1942      PT LONG TERM GOAL #1    Title Pt will be independent with HEP for improved transfers, gait and balance.  TARGET 08/25/16   Time 4   Period Weeks   Status New     PT LONG TERM GOAL #2   Title Pt will perform at least 8 of 10 reps sit<>stand transfers, no UE support for improved transfer efficiency and safety.   Time 4   Period Weeks   Status New     PT LONG TERM GOAL #3   Title Sensory Organization test to be performed, with goal to be written as appropriate. MET 07/31/16; Revised goal: Patient will demonstrate ability to use hip strategy vs step strategy in situations that are not appropriate for ankle strategy.    Baseline 07/31/16 SOT completed; Composite 83/100, Somatosensory ~95, Vision ~97, Vestibular ~83; ankle strategy preferred over hip strategy   Time 4   Period Weeks   Status New     PT LONG TERM GOAL #4   Title Pt will ambulate at least 1000 ft, outdoor surfaces, no LOB, for improved varied surface negotiation.   Time 4   Period Weeks   Status New     PT LONG TERM GOAL #5   Title Pt will negotiate at least 4 steps, 4 reps, with handrail, no incidence of foot catching on step, independently, for improved safety with stair negotiation.   Time 4   Period Weeks   Status New     PT LONG TERM GOAL #6   Title Pt will verbalize understanding of fall prevention in home environment.    Time 4   Period Weeks   Status New               Plan - 08/07/16 1608    Clinical Impression Statement Reviewed HEP and continued to work on reciprocal UE swing during gait.  Pt occasionally initiated stopping to get back into sync but also needed intermittent cues to reset and for greater UE swing backwards.  Pt requires intermittent UE support for SLS activities.          Rehab Potential Good   PT Frequency 2x / week   PT Duration 4 weeks  plus eval   PT Treatment/Interventions ADLs/Self Care Home Management;Functional mobility training;Stair training;Gait training;Therapeutic activities;Therapeutic  exercise;Balance training;Neuromuscular re-education;Patient/family education   PT Next Visit Plan , add posture exercise to HEP (?forward lunge with arms open wide or seated PWR up); work on intensity of gait-foot clearance, posture, arm swing; sit<>stand from lower surfaces; ankle/hip/step strategy (pt used ankle>hip on SOT)   PT Home Exercise Plan walking intentionally 3 minutes, 3x/day; sit to stand; high marching with big arm swing; leg swings  with opposite arm swing   Consulted and Agree with Plan of Care Patient      Patient will benefit from skilled therapeutic intervention in order to improve the following deficits and impairments:  Abnormal gait, Decreased balance, Decreased strength, Postural dysfunction  Visit Diagnosis: Other abnormalities of gait and mobility  Other symptoms and signs involving the nervous system  Muscle weakness (generalized)     Problem List Patient Active Problem List   Diagnosis Date Noted  . Hyperglycemia 02/04/2015  . Elevated blood pressure reading without diagnosis of hypertension 03/05/2013  . Nonspecific abnormal electrocardiogram (ECG) (EKG) 08/31/2011  . Parkinson's disease ([place]) 06/21/2010  . ANXIETY 07/01/2007  . BENIGN PROSTATIC HYPERTROPHY, HX OF 07/01/2007  . HYPERLIPIDEMIA 04/10/2007  . DISORDER, DYSMETABOLIC SYNDROME X 08/67/6195  . DISORDER, DEPRESSIVE NEC 04/10/2007  . ELEVATED PROSTATE SPECIFIC ANTIGEN 04/10/2007  . History of non anemic vitamin B12 deficiency 10/26/2006  . ERECTILE DYSFUNCTION 10/26/2006  . Crohn's disease of ileum with intestinal obstruction ([place]) 10/26/2006    Bjorn Loser, PTA  08/07/16, 4:15 PM [facility] [address] [place], [place], [address] Phone: [phone]   Fax:  [phone]  Name: Kyle Ball MRN: [id number] Date of Birth: Jan 20, 1946

## 2016-08-07 NOTE — Patient Instructions (Signed)
Functional Quadriceps: Sit to Stand    Sit on edge of chair, feet flat on floor. Stand upright, extending knees fully. Repeat __10__ times per set. Do __2__ sets per session. Do ___1_ sessions per day.  http://orth.exer.us/734   Copyright  VHI. All rights reserved.  Sit to Stand Transfers:  1. Scoot out to the edge of the chair 2. Place your feet flat on the floor, shoulder width apart.  Make sure your feet are tucked just under your knees. 3. Lean forward (nose over toes) with momentum, and stand up tall with your best posture.  If you need to use your arms, use them as a quick boost up to stand. 4. If you are in a low or soft chair, you can lean back and then forward up to stand, in order to get more momentum. 5. Once you are standing, make sure you are looking ahead and standing tall.  To sit down:  1. Back up until you feel the chair behind your legs. 2. Bend at you hips, reaching  Back for you chair, if needed, then slowly squat to sit down on your chair.     "I love a Parade" Lift    Stand beside counter (for support if necessary), march in place slowly (pause for 1 sec) as high as you can. Emphasize arm swing in opposition. Open hands Repeat __20__ times. Do __1__ sessions per day.  http://gt2.exer.us/344   Copyright  VHI. All rights reserved.   Arm Swing Roll    Standing beside counter. Left hand on counter. Swing left leg forward (touch your heel) and backward (touch your toe). Swing as quickly as you can and then add Right arm swing (as leg goes forward, arm goes forward). Do 20 reps and then switch to other side.    Copyright  VHI. All rights reserved.    

## 2016-08-09 ENCOUNTER — Ambulatory Visit: Payer: Medicare Other | Admitting: Physical Therapy

## 2016-08-14 ENCOUNTER — Ambulatory Visit: Payer: Medicare Other | Attending: Nurse Practitioner | Admitting: Physical Therapy

## 2016-08-14 ENCOUNTER — Encounter: Payer: Self-pay | Admitting: Physical Therapy

## 2016-08-14 DIAGNOSIS — M6281 Muscle weakness (generalized): Secondary | ICD-10-CM | POA: Insufficient documentation

## 2016-08-14 DIAGNOSIS — R2689 Other abnormalities of gait and mobility: Secondary | ICD-10-CM | POA: Diagnosis not present

## 2016-08-14 DIAGNOSIS — R29818 Other symptoms and signs involving the nervous system: Secondary | ICD-10-CM

## 2016-08-14 DIAGNOSIS — R2681 Unsteadiness on feet: Secondary | ICD-10-CM | POA: Diagnosis not present

## 2016-08-14 NOTE — Therapy (Signed)
[facility] [address] [place], [place], [address] Phone: [phone]   Fax:  [phone]  Physical Therapy Treatment  Patient Details  Name: Kyle Ball MRN: [id number] Date of Birth: [birth year] Referring Provider: Pura Spice, NP  Encounter Date: 08/14/2016      PT End of Session - 08/14/16 1320    Visit Number 5   Number of Visits 9   Date for PT Re-Evaluation 09/25/16   Authorization Type Medicare primary, BCBS 2nd-GCODE every 10th visit   PT Start Time 1240   PT Stop Time 1320   PT Time Calculation (min) 40 min   Equipment Utilized During Treatment Other (comment)  pt's belt   Activity Tolerance Patient tolerated treatment well   Behavior During Therapy [facility] for tasks assessed/performed      Past Medical History:  Diagnosis Date  . Anxiety and depression   . BPH (benign prostatic hypertrophy)   . Crohn's disease ([place]) 1979   After resection, 1 flare in 2012.  Marland Kitchen Dysmetabolic syndrome X   . ED (erectile dysfunction)   . Hyperlipidemia   . Internal hemorrhoids   . Parkinson's disease ([place])    [facility]    Past Surgical History:  Procedure Laterality Date  . COLONOSCOPY W/ BIOPSIES   06/2007, 2015   Crohn's, internal hemorrhoids, Dr Carlean Purl  . KNEE ARTHROSCOPY Right 06/2013  . PROSTATE BIOPSY     X 2, Dr Risa Grill  . QUADRICEPS REPAIR Right 1966    knee  . REFRACTIVE SURGERY  2015   Dr Bing Plume  . Terminal Ileum & Appendix Resected  1995   Crohn's    There were no vitals filed for this visit.      Subjective Assessment - 08/14/16 1241    Subjective Reports no balance issues since last visit.  Worked on repairs at home. Probably aggravated back this weekend from lifting and bending; but just sore today. Walking every evening about 15 min working on technique esp. foot clearance   Patient Stated Goals Pt's goal for therapy is to get exercises to help getting up from low/soft surfaces and  to avoid dragging feet.   Currently in Pain? Yes   Pain Score 4    Pain Location Back   Pain Orientation Lower   Pain Descriptors / Indicators Sore   Pain Type Acute pain   Pain Onset 1 to 4 weeks ago  quad tendon rupture in past   Pain Frequency Occasional                              Balance Exercises - 08/14/16 1255      Balance Exercises: Standing   SLS Eyes open  Alt tapping cones then walking forward and around obstacle progressing with upright gaze and longer SLS.   Wall Bumps Shoulder;Hip  progressing depth of movement then onto compliuant surface   Stepping Strategy Anterior;Posterior;10 reps  working on weight shifting and big motions with UEs for coordination.   Rockerboard Anterior/posterior;Lateral;Intermittent UE support  working on hip strategies           PT Education - 08/14/16 1302    Education provided Yes   Education Details Discussed scheduling and what to expect for LTG check.   Person(s) Educated Patient   Methods Explanation   Comprehension Verbalized understanding             PT Long Term Goals - 07/31/16  1942      PT LONG TERM GOAL #1   Title Pt will be independent with HEP for improved transfers, gait and balance.  TARGET 08/25/16   Time 4   Period Weeks   Status New     PT LONG TERM GOAL #2   Title Pt will perform at least 8 of 10 reps sit<>stand transfers, no UE support for improved transfer efficiency and safety.   Time 4   Period Weeks   Status New     PT LONG TERM GOAL #3   Title Sensory Organization test to be performed, with goal to be written as appropriate. MET 07/31/16; Revised goal: Patient will demonstrate ability to use hip strategy vs step strategy in situations that are not appropriate for ankle strategy.    Baseline 07/31/16 SOT completed; Composite 83/100, Somatosensory ~95, Vision ~97, Vestibular ~83; ankle strategy preferred over hip strategy   Time 4   Period Weeks   Status New     PT  LONG TERM GOAL #4   Title Pt will ambulate at least 1000 ft, outdoor surfaces, no LOB, for improved varied surface negotiation.   Time 4   Period Weeks   Status New     PT LONG TERM GOAL #5   Title Pt will negotiate at least 4 steps, 4 reps, with handrail, no incidence of foot catching on step, independently, for improved safety with stair negotiation.   Time 4   Period Weeks   Status New     PT LONG TERM GOAL #6   Title Pt will verbalize understanding of fall prevention in home environment.    Time 4   Period Weeks   Status New               Plan - 08/14/16 1635    Clinical Impression Statement Pt was able to progress with hip strategies during greater perturbations progressing to standing on compliant surface with intermittent UE support.  Pt able to perform SLS stance on level surface but had greater difficulty wiwth duration > 2 seconds.                                 Rehab Potential Good   PT Frequency 2x / week   PT Duration 4 weeks  plus eval   PT Treatment/Interventions ADLs/Self Care Home Management;Functional mobility training;Stair training;Gait training;Therapeutic activities;Therapeutic exercise;Balance training;Neuromuscular re-education;Patient/family education   PT Next Visit Plan , add posture exercise to HEP (?forward lunge with arms open wide or seated PWR up); work on intensity of gait-foot clearance, posture, arm swing; sit<>stand from lower surfaces; ankle/hip/step strategy (pt used ankle>hip on SOT)   PT Home Exercise Plan walking intentionally 3 minutes, 3x/day; sit to stand; high marching with big arm swing; leg swings with opposite arm swing   Consulted and Agree with Plan of Care Patient      Patient will benefit from skilled therapeutic intervention in order to improve the following deficits and impairments:  Abnormal gait, Decreased balance, Decreased strength, Postural dysfunction  Visit Diagnosis: Other abnormalities of gait and  mobility  Other symptoms and signs involving the nervous system  Muscle weakness (generalized)     Problem List Patient Active Problem List   Diagnosis Date Noted  . Hyperglycemia 02/04/2015  . Elevated blood pressure reading without diagnosis of hypertension 03/05/2013  . Nonspecific abnormal electrocardiogram (ECG) (EKG) 08/31/2011  . Parkinson's disease ([place]) 06/21/2010  .  ANXIETY 07/01/2007  . BENIGN PROSTATIC HYPERTROPHY, HX OF 07/01/2007  . HYPERLIPIDEMIA 04/10/2007  . DISORDER, DYSMETABOLIC SYNDROME X 84/08/9793  . DISORDER, DEPRESSIVE NEC 04/10/2007  . ELEVATED PROSTATE SPECIFIC ANTIGEN 04/10/2007  . History of non anemic vitamin B12 deficiency 10/26/2006  . ERECTILE DYSFUNCTION 10/26/2006  . Crohn's disease of ileum with intestinal obstruction ([place]) 10/26/2006    Bjorn Loser, PTA  08/14/16, 4:40 PM [facility] [address] [place], [place], [address] Phone: [phone]   Fax:  [phone]  Name: JOVEN MOM MRN: [id number] Date of Birth: 1946-03-27

## 2016-08-16 ENCOUNTER — Ambulatory Visit: Payer: Medicare Other | Admitting: Physical Therapy

## 2016-08-16 DIAGNOSIS — N401 Enlarged prostate with lower urinary tract symptoms: Secondary | ICD-10-CM | POA: Diagnosis not present

## 2016-08-16 DIAGNOSIS — N3941 Urge incontinence: Secondary | ICD-10-CM | POA: Diagnosis not present

## 2016-08-16 DIAGNOSIS — R972 Elevated prostate specific antigen [PSA]: Secondary | ICD-10-CM | POA: Diagnosis not present

## 2016-08-21 ENCOUNTER — Encounter: Payer: Self-pay | Admitting: Physical Therapy

## 2016-08-21 ENCOUNTER — Ambulatory Visit: Payer: Medicare Other | Admitting: Physical Therapy

## 2016-08-21 DIAGNOSIS — R2689 Other abnormalities of gait and mobility: Secondary | ICD-10-CM | POA: Diagnosis not present

## 2016-08-21 DIAGNOSIS — M6281 Muscle weakness (generalized): Secondary | ICD-10-CM

## 2016-08-21 DIAGNOSIS — R29818 Other symptoms and signs involving the nervous system: Secondary | ICD-10-CM

## 2016-08-21 DIAGNOSIS — R2681 Unsteadiness on feet: Secondary | ICD-10-CM | POA: Diagnosis not present

## 2016-08-21 NOTE — Therapy (Signed)
[facility] [address] [place], [place], [address] Phone: [phone]   Fax:  [phone]  Physical Therapy Treatment  Patient Details  Name: Kyle Ball MRN: [id number] Date of Birth: August 21, 1945 Referring Provider: Pura Spice, NP  Encounter Date: 08/21/2016      PT End of Session - 08/21/16 1322    Visit Number 6   Number of Visits 9   Date for PT Re-Evaluation 09/25/16   Authorization Type Medicare primary, BCBS 2nd-GCODE every 10th visit   PT Start Time 1235   PT Stop Time 1315   PT Time Calculation (min) 40 min   Equipment Utilized During Treatment Other (comment)  pt's belt   Activity Tolerance Patient tolerated treatment well   Behavior During Therapy [facility] for tasks assessed/performed      Past Medical History:  Diagnosis Date  . Anxiety and depression   . BPH (benign prostatic hypertrophy)   . Crohn's disease ([place]) 1979   After resection, 1 flare in 2012.  Marland Kitchen Dysmetabolic syndrome X   . ED (erectile dysfunction)   . Hyperlipidemia   . Internal hemorrhoids   . Parkinson's disease ([place])    [facility]    Past Surgical History:  Procedure Laterality Date  . COLONOSCOPY W/ BIOPSIES   06/2007, 2015   Crohn's, internal hemorrhoids, Dr Carlean Purl  . KNEE ARTHROSCOPY Right 06/2013  . PROSTATE BIOPSY     X 2, Dr Risa Grill  . QUADRICEPS REPAIR Right 1966    knee  . REFRACTIVE SURGERY  2015   Dr Bing Plume  . Terminal Ileum & Appendix Resected  1995   Crohn's    There were no vitals filed for this visit.      Subjective Assessment - 08/21/16 1236    Subjective Reports no balance issues since last visit.  Pt feels like he is walking better and that his balance reactions have progressed some. Pt would  be interested in continuing PT for 4 more weeks to work on posture strengthening and balance reactions on more compliant surfaces.   Patient Stated Goals Pt's goal for therapy is to get exercises to  help getting up from low/soft surfaces and to avoid dragging feet.   Currently in Pain? No/denies   Pain Onset 1 to 4 weeks ago  quad tendon rupture in past                         [facility] Adult PT Treatment/Exercise - 08/21/16 0001      Ambulation/Gait   Stairs Yes   Stairs Assistance 6: Modified independent (Device/Increase time)   Stair Management Technique One rail Right;No rails  progressing to no rails   Number of Stairs 8             Balance Exercises - 08/21/16 1250      Balance Exercises: Standing   Standing, One Foot on a Step Eyes open;Foam/compliant surface;2 inch  with one foot on balance beam, other foot stepping and weight shifting forward/backward; cues for posture and initial heel strike when stepping forward.   Stepping Strategy Anterior;Posterior;Lateral;Foam/compliant surface  Multidirectional stepping with weight shifts, solid to compliant surface   Step Ups 4 inch;Forward and backward;Intermittent UE support  then stepping down and back up           PT Education - 08/21/16 1308    Education provided Yes   Education Details Gave standing posture strengthening exercies for HEP.  Person(s) Educated Patient   Methods Explanation;Demonstration;Verbal cues;Handout   Comprehension Verbalized understanding;Returned demonstration;Need further instruction             PT Long Term Goals - 07/31/16 1942      PT LONG TERM GOAL #1   Title Pt will be independent with HEP for improved transfers, gait and balance.  TARGET 08/25/16   Time 4   Period Weeks   Status New     PT LONG TERM GOAL #2   Title Pt will perform at least 8 of 10 reps sit<>stand transfers, no UE support for improved transfer efficiency and safety.   Time 4   Period Weeks   Status New     PT LONG TERM GOAL #3   Title Sensory Organization test to be performed, with goal to be written as appropriate. MET 07/31/16; Revised goal: Patient will demonstrate ability to use  hip strategy vs step strategy in situations that are not appropriate for ankle strategy.    Baseline 07/31/16 SOT completed; Composite 83/100, Somatosensory ~95, Vision ~97, Vestibular ~83; ankle strategy preferred over hip strategy   Time 4   Period Weeks   Status New     PT LONG TERM GOAL #4   Title Pt will ambulate at least 1000 ft, outdoor surfaces, no LOB, for improved varied surface negotiation.   Time 4   Period Weeks   Status New     PT LONG TERM GOAL #5   Title Pt will negotiate at least 4 steps, 4 reps, with handrail, no incidence of foot catching on step, independently, for improved safety with stair negotiation.   Time 4   Period Weeks   Status New     PT LONG TERM GOAL #6   Title Pt will verbalize understanding of fall prevention in home environment.    Time 4   Period Weeks   Status New               Plan - 08/21/16 1303    Clinical Impression Statement Discussesd POC, and pt understands plan for LTG check next visit, but would like to continue with therapy. Updated HEP to include standing posture strengthening.  Performed stepping strategies on/off compliant surface; pt performed at supervision level with no major LOB.                 Rehab Potential Good   PT Frequency 2x / week   PT Duration 4 weeks  plus eval   PT Treatment/Interventions ADLs/Self Care Home Management;Functional mobility training;Stair training;Gait training;Therapeutic activities;Therapeutic exercise;Balance training;Neuromuscular re-education;Patient/family education   PT Next Visit Plan , add posture exercise to HEP (?forward lunge with arms open wide or seated PWR up); work on intensity of gait-foot clearance, posture, arm swing; sit<>stand from lower surfaces; ankle/hip/step strategy (pt used ankle>hip on SOT)   PT Home Exercise Plan check LTGs   Consulted and Agree with Plan of Care Patient      Patient will benefit from skilled therapeutic intervention in order to improve the  following deficits and impairments:  Abnormal gait, Decreased balance, Decreased strength, Postural dysfunction  Visit Diagnosis: Other abnormalities of gait and mobility  Other symptoms and signs involving the nervous system  Muscle weakness (generalized)     Problem List Patient Active Problem List   Diagnosis Date Noted  . Hyperglycemia 02/04/2015  . Elevated blood pressure reading without diagnosis of hypertension 03/05/2013  . Nonspecific abnormal electrocardiogram (ECG) (EKG) 08/31/2011  . Parkinson's disease ([place]) 06/21/2010  .  ANXIETY 07/01/2007  . BENIGN PROSTATIC HYPERTROPHY, HX OF 07/01/2007  . HYPERLIPIDEMIA 04/10/2007  . DISORDER, DYSMETABOLIC SYNDROME X 00/92/3300  . DISORDER, DEPRESSIVE NEC 04/10/2007  . ELEVATED PROSTATE SPECIFIC ANTIGEN 04/10/2007  . History of non anemic vitamin B12 deficiency 10/26/2006  . ERECTILE DYSFUNCTION 10/26/2006  . Crohn's disease of ileum with intestinal obstruction ([place]) 10/26/2006    Bjorn Loser, PTA  08/21/16, 1:29 PM [facility] [address] [place], [place], [address] Phone: [phone]   Fax:  [phone]  Name: Kyle Ball MRN: [id number] Date of Birth: 02-22-1946

## 2016-08-21 NOTE — Patient Instructions (Addendum)
  Posture exercises Standing at the door frame: Stand with back against the door frame  1. Head retraction- keep chin level and pull head straight back to frame. 5x 2. Shoulder blade squeezes 5x 3. Alteranate Arm raises- Raise arm up towards door frame 5x Hold each for 5 seconds, 5x each, perform 1-2x/day

## 2016-08-23 ENCOUNTER — Ambulatory Visit: Payer: Medicare Other | Admitting: Physical Therapy

## 2016-08-23 DIAGNOSIS — M6281 Muscle weakness (generalized): Secondary | ICD-10-CM

## 2016-08-23 DIAGNOSIS — R2689 Other abnormalities of gait and mobility: Secondary | ICD-10-CM

## 2016-08-23 DIAGNOSIS — R2681 Unsteadiness on feet: Secondary | ICD-10-CM | POA: Diagnosis not present

## 2016-08-23 DIAGNOSIS — R29818 Other symptoms and signs involving the nervous system: Secondary | ICD-10-CM | POA: Diagnosis not present

## 2016-08-24 NOTE — Therapy (Signed)
[facility] [address] [place], [place], [address] Phone: [phone]   Fax:  [phone]  Physical Therapy Treatment  Patient Details  Name: Kyle Ball MRN: [id number] Date of Birth: Apr 07, 1946 Referring Provider: Pura Spice, NP  Encounter Date: 08/23/2016      PT End of Session - 08/24/16 1601    Visit Number 7   Number of Visits 15   Date for PT Re-Evaluation 09/98/33  per recert 02/04/04   Authorization Type Medicare primary, BCBS 2nd-GCODE every 10th visit   PT Start Time 1016   PT Stop Time 1105   PT Time Calculation (min) 49 min   Equipment Utilized During Treatment Other (comment)  pt's belt   Activity Tolerance Patient tolerated treatment well   Behavior During Therapy [facility] for tasks assessed/performed      Past Medical History:  Diagnosis Date  . Anxiety and depression   . BPH (benign prostatic hypertrophy)   . Crohn's disease ([place]) 1979   After resection, 1 flare in 2012.  Marland Kitchen Dysmetabolic syndrome X   . ED (erectile dysfunction)   . Hyperlipidemia   . Internal hemorrhoids   . Parkinson's disease ([place])    [facility]    Past Surgical History:  Procedure Laterality Date  . COLONOSCOPY W/ BIOPSIES   06/2007, 2015   Crohn's, internal hemorrhoids, Dr Carlean Purl  . KNEE ARTHROSCOPY Right 06/2013  . PROSTATE BIOPSY     X 2, Dr Risa Grill  . QUADRICEPS REPAIR Right 1966    knee  . REFRACTIVE SURGERY  2015   Dr Bing Plume  . Terminal Ileum & Appendix Resected  1995   Crohn's    There were no vitals filed for this visit.      Subjective Assessment - 08/23/16 1019    Subjective Feels like he wants to make sure to work on posture and continue to work on balance.   Patient Stated Goals Pt's goal for therapy is to get exercises to help getting up from low/soft surfaces and to avoid dragging feet.   Currently in Pain? No/denies   Pain Onset 1 to 4 weeks ago  quad tendon rupture in past                          [facility] Adult PT Treatment/Exercise - 08/24/16 0001      Transfers   Transfers Sit to Stand;Stand to Sit   Sit to Stand 6: Modified independent (Device/Increase time);Without upper extremity assist;From bed;From chair/3-in-1   Five time sit to stand comments  12.06   Stand to Sit 6: Modified independent (Device/Increase time);Without upper extremity assist;To bed;To chair/3-in-1   Number of Reps 10 reps;Other sets (comment)  from 22" mat, 18" chair   Comments 5 reps from low, soft surface, simulating restaurant booth.  Pt needs cues for rocking, for scooting forward and large amplitude movements for improved transfer efficiency (to avoid excess effort and "getting stuck" midway through transfer.     Ambulation/Gait   Ambulation/Gait Yes   Ambulation/Gait Assistance 5: Supervision   Ambulation/Gait Assistance Details forward flexed, rounded shoulder posture with decreased arm swing   Ambulation Distance (Feet) 800 Feet   Assistive device None   Gait Pattern Step-through pattern;Decreased arm swing - right;Decreased arm swing - left;Decreased step length - right;Decreased step length - left;Decreased trunk rotation;Poor foot clearance - left;Poor foot clearance - right   Ambulation Surface Unlevel;Outdoor;Paved;Grass  curbs   Stairs Yes  Stairs Assistance 6: Modified independent (Device/Increase time)   Stair Management Technique One rail Right;No rails  cues for increased foot clearance   Number of Stairs 4  5 reps   Height of Stairs 6     Self-Care   Self-Care Other Self-Care Comments   Other Self-Care Comments  Discussed POC, including pt's goals to continue work on posture and balance.  Pt reports noting decreased SLS at boxing class and wants additional exercises to address posture.  Discussed progress towards goals and plans to continue PT/updated goals/POC.       Neuro Re-education: Balance perturbations given at hips, shoulders, in  posterior, anterior, and lateral directions, with appropriate use of hip/ankle strategy for maintaining balance. Single limb stance attempts:  Pt able to hold position <3 seconds, taking several small steps, with UE support to recover (reports doing this in boxing class)          PT Education - 08/24/16 1600    Education provided Yes   Education Details Progress towards goals, plans to continue PT, updated POC   Person(s) Educated Patient   Methods Explanation   Comprehension Verbalized understanding             PT Long Term Goals - 08/23/16 1039      PT LONG TERM GOAL #1   Title Pt will be independent with HEP for improved transfers, gait and balance.  TARGET 08/25/16  UPDATED GOAL:  Pt will be independent with HEP for transfers, gait, balance, and posture.  TARGET 09/23/16   Time 4  per recert 4/49/20   Period Weeks   Status On-going     PT LONG TERM GOAL #2   Title Pt will perform at least 8 of 10 reps sit<>stand transfers, no UE support for improved transfer efficiency and safety.  UPDATED Goal:  Pt will perform 8 of 10 reps of sit<>stand transfers <18" surfaces, independently, without hesitation.  TARGET 09/23/16   Baseline Able to perform, but pt needs cues for technique from surfaces lower than 18"   Time 4  per recert 1/00/71   Period Weeks   Status Revised     PT LONG TERM GOAL #3   Title Sensory Organization test to be performed, with goal to be written as appropriate. MET 07/31/16; Revised goal: Patient will demonstrate ability to use hip strategy vs step strategy in situations that are not appropriate for ankle strategy.    Baseline 07/31/16 SOT completed; Composite 83/100, Somatosensory ~95, Vision ~97, Vestibular ~83; ankle strategy preferred over hip strategy   Time 4   Period Weeks   Status Achieved     PT LONG TERM GOAL #4   Title Pt will ambulate at least 1000 ft, outdoor surfaces, no LOB, for improved varied surface negotiation.  ONGOING TARGET 09/23/16    Baseline 800 ft 08/23/16   Time 4  per recert 07/31/73   Period Weeks   Status New     PT LONG TERM GOAL #5   Title Pt will negotiate at least 4 steps, 4 reps, with handrail, no incidence of foot catching on step, independently, for improved safety with stair negotiation.   Time 4   Period Weeks   Status Achieved     PT LONG TERM GOAL #6   Title Pt will verbalize understanding of fall prevention in home environment.   UPDATED TARGET 09/23/16   Time 4  per recert 8/83/25   Period Weeks   Status On-going  Plan - 08/24/16 1602    Clinical Impression Statement Assessed LTGs this visit, with pt meeting LTG 3 and 5.  Pt has made progress towards LTG 1, 2, 4, 6, but would benefit from further skilled PT to address posture, transfers, gait and balance.  He has missed several appointments over the plan of care, and HEP was not fully able to assess HEP this visit due to time constraints.   Rehab Potential Good   PT Frequency 2x / week   PT Duration 4 weeks  Per renewal 08/23/16   PT Treatment/Interventions ADLs/Self Care Home Management;Functional mobility training;Stair training;Gait training;Therapeutic activities;Therapeutic exercise;Balance training;Neuromuscular re-education;Patient/family education   PT Next Visit Plan Sit<>stand low surfaces, Review full HEP and continue to add posture exercises; compliant surface gait work   Oncologist with Plan of Care Patient      Patient will benefit from skilled therapeutic intervention in order to improve the following deficits and impairments:  Abnormal gait, Decreased balance, Decreased strength, Postural dysfunction  Visit Diagnosis: Other symptoms and signs involving the nervous system  Other abnormalities of gait and mobility  Muscle weakness (generalized)  Unsteadiness on feet     Problem List Patient Active Problem List   Diagnosis Date Noted  . Hyperglycemia 02/04/2015  . Elevated blood pressure  reading without diagnosis of hypertension 03/05/2013  . Nonspecific abnormal electrocardiogram (ECG) (EKG) 08/31/2011  . Parkinson's disease ([place]) 06/21/2010  . ANXIETY 07/01/2007  . BENIGN PROSTATIC HYPERTROPHY, HX OF 07/01/2007  . HYPERLIPIDEMIA 04/10/2007  . DISORDER, DYSMETABOLIC SYNDROME X 74/05/8785  . DISORDER, DEPRESSIVE NEC 04/10/2007  . ELEVATED PROSTATE SPECIFIC ANTIGEN 04/10/2007  . History of non anemic vitamin B12 deficiency 10/26/2006  . ERECTILE DYSFUNCTION 10/26/2006  . Crohn's disease of ileum with intestinal obstruction ([place]) 10/26/2006    Mckynna Vanloan W. 08/24/2016, 4:08 PM  Frazier Butt., PT  [facility] [address] [place], [place], [address] Phone: [phone]   Fax:  [phone]  Name: AQUILA DELAUGHTER MRN: [id number] Date of Birth: 1945-07-05

## 2016-09-07 ENCOUNTER — Encounter: Payer: Self-pay | Admitting: Internal Medicine

## 2016-09-07 ENCOUNTER — Ambulatory Visit (INDEPENDENT_AMBULATORY_CARE_PROVIDER_SITE_OTHER): Payer: Medicare Other | Admitting: Internal Medicine

## 2016-09-07 VITALS — BP 140/68 | HR 80 | Ht 72.0 in | Wt 265.1 lb

## 2016-09-07 DIAGNOSIS — K5900 Constipation, unspecified: Secondary | ICD-10-CM

## 2016-09-07 DIAGNOSIS — K50012 Crohn's disease of small intestine with intestinal obstruction: Secondary | ICD-10-CM

## 2016-09-07 DIAGNOSIS — R197 Diarrhea, unspecified: Secondary | ICD-10-CM

## 2016-09-07 NOTE — Patient Instructions (Signed)
You may stop your prednisone per Dr Carlean Purl.   Dr Carlean Purl recommends that you complete a bowel purge (to clean out your bowels). Please do the following: Purchase a bottle of Miralax over the counter as well as a box of 5 mg dulcolax tablets. Take 4 dulcolax tablets. Wait 1 hour. You will then drink 6-8 capfuls of Miralax mixed in an adequate amount of water/juice/gatorade (you may choose which of these liquids to drink) over the next 2-3 hours. You should expect results within 1 to 6 hours after completing the bowel purge.    After doing the purge take one dose of miralax every evening and then call us with an update on April 9th please.     I appreciate the opportunity to care for you. Silvano Rusk, MD, [facility]

## 2016-09-07 NOTE — Progress Notes (Signed)
   Kyle Ball 71 y.o. 1945/12/25 [id number]  Assessment & Plan:   Encounter Diagnoses  Name Primary?  . Constipation, unspecified constipation type Yes  . Diarrhea, unspecified type   . Crohn's disease of small intestine with intestinal obstruction ([place])     He has not responded to steroids though it has been low dose. His wife helps with the history today and he really swings between constipation and urgent diarrhea with fecal incontinence. He seems to have a soft impaction on exam today so perhaps this problem is really constipation and some overflow type issues are unloading. He certainly is at risk for problems related to ileal stricture also.  We reviewed the various options and having at this point he will discontinue his prednisone. It has not helped. Think his problem may be more of inadequate bowel movements and backing up and unloading if you will related the primary constipation issues.  MiraLAX purge with daily MiraLAX and I want a phone call update in 10 days. If that does not help, we'll schedule a colonoscopy with consideration for possible balloon dilation of the ileocolonic anastomosis. Reviewed the risks benefits and indications of all that.  I appreciate the opportunity to care for this patient. CC: GOAS, Barrie Dunker, NP  Subjective:   Chief Complaint: Diarrhea and constipation  HPI The patient is here with his wife today who dissipates in the history. I have been seeing him or my PA has also for diarrhea problems related to Crohn's. A recent trial of prednisone has not helped. The patient's wife says he is miserable. He has constipation and will move his bowels for 2 or 3 days then has a bowel movement with a lot of straining which then leads to voluminous diarrhea afterwards, and with incontinence. Looking back E has had some urgency and diarrhea in the past. At his last colonoscopy in 2015 he had ileocolonic anastomosis Crohn's but didn't seem to be too  symptomatic I guess other than some urgent diarrhea. I did not see him for sometime after that. His stepson is getting married on the [place] in late April. The fecal incontinence is a source of anxiety for him, making his quality of life worse in the constipation sensation makes him feel generally lousy when he is in that mode.  Today and his wife tell me that his college roommate also got Crohn's. Buzz asks about whether or not an MRI of the abdomen which is roommate just got would help determine things. Medications, allergies, past medical history, past surgical history, family history and social history are reviewed and updated in the EMR.  Review of Systems He is doing the boxing training to try to help his Parkinson's and believes it is.  Objective:   Physical Exam BP 140/68 (BP Location: Left Arm, Patient Position: Sitting, Cuff Size: Large)   Pulse 80   Ht 6' (1.829 m) Comment: height measured without shoes  Wt 265 lb 2 oz (120.3 kg)   BMI 35.96 kg/m  Mask facies Bradykinetic neuro abd obese soft w/ small ventral hernia, NT Rectal NL anoderm, Mildly reduced resting anal tone - large amount of soft stool in rectum

## 2016-09-13 DIAGNOSIS — R972 Elevated prostate specific antigen [PSA]: Secondary | ICD-10-CM | POA: Diagnosis not present

## 2016-09-13 DIAGNOSIS — N3941 Urge incontinence: Secondary | ICD-10-CM | POA: Diagnosis not present

## 2016-09-13 DIAGNOSIS — N5201 Erectile dysfunction due to arterial insufficiency: Secondary | ICD-10-CM | POA: Diagnosis not present

## 2016-09-21 DIAGNOSIS — N3941 Urge incontinence: Secondary | ICD-10-CM | POA: Diagnosis not present

## 2016-09-28 DIAGNOSIS — N3941 Urge incontinence: Secondary | ICD-10-CM | POA: Diagnosis not present

## 2016-10-03 DIAGNOSIS — N3941 Urge incontinence: Secondary | ICD-10-CM | POA: Diagnosis not present

## 2016-10-12 DIAGNOSIS — N3941 Urge incontinence: Secondary | ICD-10-CM | POA: Diagnosis not present

## 2016-10-17 DIAGNOSIS — F900 Attention-deficit hyperactivity disorder, predominantly inattentive type: Secondary | ICD-10-CM | POA: Diagnosis not present

## 2016-10-17 DIAGNOSIS — F3341 Major depressive disorder, recurrent, in partial remission: Secondary | ICD-10-CM | POA: Diagnosis not present

## 2016-10-19 DIAGNOSIS — N3941 Urge incontinence: Secondary | ICD-10-CM | POA: Diagnosis not present

## 2016-10-26 DIAGNOSIS — N3941 Urge incontinence: Secondary | ICD-10-CM | POA: Diagnosis not present

## 2016-10-31 ENCOUNTER — Telehealth: Payer: Self-pay | Admitting: Internal Medicine

## 2016-10-31 NOTE — Telephone Encounter (Signed)
Left message for patient to call back  

## 2016-11-01 NOTE — Telephone Encounter (Addendum)
Patients voicemail is full.  I am unable to leave a message this morning

## 2016-11-02 DIAGNOSIS — N3941 Urge incontinence: Secondary | ICD-10-CM | POA: Diagnosis not present

## 2016-11-02 NOTE — Telephone Encounter (Signed)
Unable to leave message.  Will await a return call from the patient

## 2016-11-09 DIAGNOSIS — N3941 Urge incontinence: Secondary | ICD-10-CM | POA: Diagnosis not present

## 2016-11-16 DIAGNOSIS — N3941 Urge incontinence: Secondary | ICD-10-CM | POA: Diagnosis not present

## 2016-11-28 ENCOUNTER — Encounter: Payer: Self-pay | Admitting: Internal Medicine

## 2016-11-28 ENCOUNTER — Encounter (INDEPENDENT_AMBULATORY_CARE_PROVIDER_SITE_OTHER): Payer: Self-pay

## 2016-11-28 ENCOUNTER — Ambulatory Visit (INDEPENDENT_AMBULATORY_CARE_PROVIDER_SITE_OTHER): Payer: Medicare Other | Admitting: Internal Medicine

## 2016-11-28 VITALS — BP 104/58 | HR 72 | Ht 72.0 in | Wt 264.4 lb

## 2016-11-28 DIAGNOSIS — K56699 Other intestinal obstruction unspecified as to partial versus complete obstruction: Secondary | ICD-10-CM | POA: Diagnosis not present

## 2016-11-28 DIAGNOSIS — K50012 Crohn's disease of small intestine with intestinal obstruction: Secondary | ICD-10-CM

## 2016-11-28 DIAGNOSIS — R198 Other specified symptoms and signs involving the digestive system and abdomen: Secondary | ICD-10-CM | POA: Diagnosis not present

## 2016-11-28 NOTE — Progress Notes (Signed)
Kyle Ball 71 y.o. 1945/06/30 [id number]  Assessment & Plan:   Encounter Diagnoses  Name Primary?  . 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 Alternating constipation and diarrhea     ? If his ileo-colonc stricture is the problem. Colonoscopy and possible ileal stricture dilation. The risks and benefits as well as alternatives of endoscopic procedure(s) have been discussed and reviewed. All questions answered. The patient agrees to proceed.  Go back to daily Miralax after a purge  MK:LKJZ, Barrie Dunker, NP  Subjective:   Chief Complaint: constipation and diarrhea  HPI Kyle Ball is here w/ wife again - was better after Miralax purge and daily Miralax but went to [facility] for son's wedding and tool loperamide and has been using that again some and back to the episodic urgent diarrhea and alternating constipation.   Allergies  Allergen Reactions  . Penicillins     Rash age 13 with PCN injection. Because of a history of documented adverse serious drug reaction;Medi Alert bracelet  is recommended   Current Meds  Medication Sig  . aspirin 81 MG tablet Take 81 mg by mouth daily.    . Carbidopa-Levodopa ER (SINEMET CR) 25-100 MG tablet controlled release 3 (three) times daily.   . citalopram (CELEXA) 20 MG tablet Take 30 mg by mouth daily.   Marland Kitchen dutasteride (AVODART) 0.5 MG capsule Take 0.5 mg by mouth daily.  . Probiotic Product (PROBIOTIC PO) Take 1 tablet by mouth daily.  Marland Kitchen rOPINIRole (REQUIP) 4 MG tablet Take by mouth. Take 2 by mouth in the am, 2 by mouth mid-day.  . Tamsulosin HCl (FLOMAX) 0.4 MG CAPS Take 0.4 mg by mouth daily.    Marland Kitchen VIAGRA 100 MG tablet as needed.   Past Medical History:  Diagnosis Date  . Anxiety and depression   . BPH (benign prostatic hypertrophy)   . Crohn's disease ([place]) 1979   After resection, 1 flare in 2012.  Marland Kitchen Dysmetabolic syndrome X   . ED (erectile dysfunction)   . Hyperlipidemia   .  Internal hemorrhoids   . Parkinson's disease ([place])    [facility]   Past Surgical History:  Procedure Laterality Date  . COLONOSCOPY W/ BIOPSIES   06/2007, 2015   Crohn's, internal hemorrhoids, Dr Carlean Purl  . KNEE ARTHROSCOPY Right 06/2013  . PROSTATE BIOPSY     X 2, Dr Risa Grill  . PTNS Treatment     urinary incontinence  . QUADRICEPS REPAIR Right 1966    knee  . REFRACTIVE SURGERY  2015   Dr Bing Plume  . Terminal Ileum & Appendix Resected  1995   Crohn's   Social History   Social History  . Marital status: Single    Spouse name: N/A  . Number of children: 2  . Years of education: N/A   Occupational History  . 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 Smoking status: Never Smoker  . Smokeless tobacco: Never Used  . Alcohol use 8.4 oz/week    14 Glasses of wine per week  . Drug use: No  . Sexual activity: Not on file   Other Topics Concern  . Not on file   Social History Narrative   NO DIET   family history includes Diabetes in his father; Fibromyalgia in his sister; Heart attack (age of onset: 87) in his father; Hypertension in his father; Mental illness in his sister; Stroke (  age of onset: 61) in his father; Testicular cancer (age of onset: 57) in his brother; Uterine cancer in his mother.   Review of Systems   Objective:   Physical Exam BP (!) 104/58 (BP Location: Left Arm, Patient Position: Sitting, Cuff Size: Normal)   Pulse 72   Ht 6' (1.829 m)   Wt 264 lb 6 oz (119.9 kg)   BMI 35.86 kg/m  Lungs cta abd soft nt small incisional hernia midline Ht s1s2

## 2016-11-28 NOTE — Patient Instructions (Signed)
   You have been scheduled for a colonoscopy. Please follow written instructions given to you at your visit today.  Please pick up your prep supplies at the pharmacy. If you use inhalers (even only as needed), please bring them with you on the day of your procedure. Your physician has requested that you go to www.[email or web address].com and enter the access code given to you at your visit today. This web site gives a general overview about your procedure. However, you should still follow specific instructions given to you by our office regarding your preparation for the procedure.    Dr Carlean Purl recommends that you complete a bowel purge (to clean out your bowels). Please do the following: Purchase a bottle of Miralax over the counter as well as a box of 5 mg dulcolax tablets. Take 4 dulcolax tablets. Wait 1 hour. You will then drink 6-8 capfuls of Miralax mixed in an adequate amount of water/juice/gatorade (you may choose which of these liquids to drink) over the next 2-3 hours. You should expect results within 1 to 6 hours after completing the bowel purge.  After doing the purge take a dose of Miralax daily.   I appreciate the opportunity to care for you. Silvano Rusk, MD, [facility]

## 2016-11-29 DIAGNOSIS — F339 Major depressive disorder, recurrent, unspecified: Secondary | ICD-10-CM | POA: Diagnosis not present

## 2016-11-29 DIAGNOSIS — R600 Localized edema: Secondary | ICD-10-CM | POA: Diagnosis not present

## 2016-11-29 DIAGNOSIS — G4752 REM sleep behavior disorder: Secondary | ICD-10-CM | POA: Diagnosis not present

## 2016-11-29 DIAGNOSIS — Z79899 Other long term (current) drug therapy: Secondary | ICD-10-CM | POA: Diagnosis not present

## 2016-11-29 DIAGNOSIS — G20 Parkinson's disease: Secondary | ICD-10-CM | POA: Diagnosis not present

## 2016-11-29 DIAGNOSIS — R42 Dizziness and giddiness: Secondary | ICD-10-CM | POA: Diagnosis not present

## 2016-11-29 DIAGNOSIS — N400 Enlarged prostate without lower urinary tract symptoms: Secondary | ICD-10-CM | POA: Diagnosis not present

## 2016-11-30 DIAGNOSIS — N3941 Urge incontinence: Secondary | ICD-10-CM | POA: Diagnosis not present

## 2016-12-04 ENCOUNTER — Encounter: Payer: Self-pay | Admitting: Family

## 2016-12-04 ENCOUNTER — Ambulatory Visit (INDEPENDENT_AMBULATORY_CARE_PROVIDER_SITE_OTHER): Payer: Medicare Other | Admitting: Family

## 2016-12-04 VITALS — BP 132/80 | HR 64 | Temp 98.5°F | Resp 16 | Ht 72.0 in | Wt 263.4 lb

## 2016-12-04 DIAGNOSIS — G20 Parkinson's disease: Secondary | ICD-10-CM | POA: Diagnosis not present

## 2016-12-04 DIAGNOSIS — Z0000 Encounter for general adult medical examination without abnormal findings: Secondary | ICD-10-CM

## 2016-12-04 DIAGNOSIS — R350 Frequency of micturition: Secondary | ICD-10-CM | POA: Diagnosis not present

## 2016-12-04 DIAGNOSIS — E782 Mixed hyperlipidemia: Secondary | ICD-10-CM | POA: Diagnosis not present

## 2016-12-04 DIAGNOSIS — Z7289 Other problems related to lifestyle: Secondary | ICD-10-CM | POA: Diagnosis not present

## 2016-12-04 DIAGNOSIS — E785 Hyperlipidemia, unspecified: Secondary | ICD-10-CM | POA: Diagnosis not present

## 2016-12-04 DIAGNOSIS — E669 Obesity, unspecified: Secondary | ICD-10-CM | POA: Insufficient documentation

## 2016-12-04 DIAGNOSIS — Z6835 Body mass index (BMI) 35.0-35.9, adult: Secondary | ICD-10-CM | POA: Diagnosis not present

## 2016-12-04 DIAGNOSIS — N401 Enlarged prostate with lower urinary tract symptoms: Secondary | ICD-10-CM | POA: Diagnosis not present

## 2016-12-04 DIAGNOSIS — K50012 Crohn's disease of small intestine with intestinal obstruction: Secondary | ICD-10-CM | POA: Diagnosis not present

## 2016-12-04 NOTE — Patient Instructions (Addendum)
Thank you for choosing [facility].  SUMMARY AND INSTRUCTIONS:  Please continue to take your medications as prescribed.  Work on Automotive engineer.   Keep your legs elevated when seated.  Decrease the salt/sodium intake.   Compression sock as needed.   They will call with your referral to weight management. In the meantime recommend a Mediterranian style intake with information provided below.   Continue to exercise.   Consider the pneumonia vaccinations.  Labs:  Please stop by the lab on the lower level of the building for your blood work. Your results will be released to [place] (or called to you) after review, usually within 72 hours after test completion. If any changes need to be made, you will be notified at that same time.  1.) The lab is open from 7:30am to 5:30 pm Monday-Friday 2.) No appointment is necessary 3.) Fasting (if needed) is 6-8 hours after food and drink; black coffee and water are okay   Referrals:  Referrals have been made during this visit. You should expect to hear back from our schedulers in about 7-10 days in regards to establishing an appointment with the specialists we discussed.   Follow up:  If your symptoms worsen or fail to improve, please contact our office for further instruction, or in case of emergency go directly to the emergency room at the closest medical facility.   Health Maintenance  Topic Date Due  . Hepatitis C Screening  1945-09-10  . PNA vac Low Risk Adult (1 of 2 - PCV13) 12/27/2012  . INFLUENZA VACCINE  01/10/2017  . TETANUS/TDAP  06/21/2020  . COLONOSCOPY  03/20/2024    Health Maintenance, Male A healthy lifestyle and preventive care is important for your health and wellness. Ask your health care provider about what schedule of regular examinations is right for you. What should I know about weight and diet? Eat a Healthy Diet  Eat plenty of vegetables, fruits, whole grains, low-fat dairy  products, and lean protein.  Do not eat a lot of foods high in solid fats, added sugars, or salt.  Maintain a Healthy Weight Regular exercise can help you achieve or maintain a healthy weight. You should:  Do at least 150 minutes of exercise each week. The exercise should increase your heart rate and make you sweat (moderate-intensity exercise).  Do strength-training exercises at least twice a week.  Watch Your Levels of Cholesterol and Blood Lipids  Have your blood tested for lipids and cholesterol every 5 years starting at 69 years of age. If you are at high risk for heart disease, you should start having your blood tested when you are 69 years old. You may need to have your cholesterol levels checked more often if: ? Your lipid or cholesterol levels are high. ? You are older than 69 years of age. ? You are at high risk for heart disease.  What should I know about cancer screening? Many types of cancers can be detected early and may often be prevented. Lung Cancer  You should be screened every year for lung cancer if: ? You are a current smoker who has smoked for at least 30 years. ? You are a former smoker who has quit within the past 15 years.  Talk to your health care provider about your screening options, when you should start screening, and how often you should be screened.  Colorectal Cancer  Routine colorectal cancer screening usually begins at 69 years of age and should be repeated  every 5-10 years until you are 69 years old. You may need to be screened more often if early forms of precancerous polyps or small growths are found. Your health care provider may recommend screening at an earlier age if you have risk factors for colon cancer.  Your health care provider may recommend using home test kits to check for hidden blood in the stool.  A small camera at the end of a tube can be used to examine your colon (sigmoidoscopy or colonoscopy). This checks for the earliest forms  of colorectal cancer.  Prostate and Testicular Cancer  Depending on your age and overall health, your health care provider may do certain tests to screen for prostate and testicular cancer.  Talk to your health care provider about any symptoms or concerns you have about testicular or prostate cancer.  Skin Cancer  Check your skin from head to toe regularly.  Tell your health care provider about any new moles or changes in moles, especially if: ? There is a change in a mole's size, shape, or color. ? You have a mole that is larger than a pencil eraser.  Always use sunscreen. Apply sunscreen liberally and repeat throughout the day.  Protect yourself by wearing long sleeves, pants, a wide-brimmed hat, and sunglasses when outside.  What should I know about heart disease, diabetes, and high blood pressure?  If you are 43-51 years of age, have your blood pressure checked every 3-5 years. If you are 26 years of age or older, have your blood pressure checked every year. You should have your blood pressure measured twice-once when you are at a hospital or clinic, and once when you are not at a hospital or clinic. Record the average of the two measurements. To check your blood pressure when you are not at a hospital or clinic, you can use: ? An automated blood pressure machine at a pharmacy. ? A home blood pressure monitor.  Talk to your health care provider about your target blood pressure.  If you are between 31-72 years old, ask your health care provider if you should take aspirin to prevent heart disease.  Have regular diabetes screenings by checking your fasting blood sugar level. ? If you are at a normal weight and have a low risk for diabetes, have this test once every three years after the age of 57. ? If you are overweight and have a high risk for diabetes, consider being tested at a younger age or more often.  A one-time screening for abdominal aortic aneurysm (AAA) by ultrasound is  recommended for men aged 23-75 years who are current or former smokers. What should I know about preventing infection? Hepatitis B If you have a higher risk for hepatitis B, you should be screened for this virus. Talk with your health care provider to find out if you are at risk for hepatitis B infection. Hepatitis C Blood testing is recommended for:  Everyone born from [birth year] through 1963.  Anyone with known risk factors for hepatitis C.  Sexually Transmitted Diseases (STDs)  You should be screened each year for STDs including gonorrhea and chlamydia if: ? You are sexually active and are younger than 69 years of age. ? You are older than 69 years of age and your health care provider tells you that you are at risk for this type of infection. ? Your sexual activity has changed since you were last screened and you are at an increased risk for chlamydia or gonorrhea. Ask  your health care provider if you are at risk.  Talk with your health care provider about whether you are at high risk of being infected with HIV. Your health care provider may recommend a prescription medicine to help prevent HIV infection.  What else can I do?  Schedule regular health, dental, and eye exams.  Stay current with your vaccines (immunizations).  Do not use any tobacco products, such as cigarettes, chewing tobacco, and e-cigarettes. If you need help quitting, ask your health care provider.  Limit alcohol intake to no more than 2 drinks per day. One drink equals 12 ounces of beer, 5 ounces of wine, or 1 ounces of hard liquor.  Do not use street drugs.  Do not share needles.  Ask your health care provider for help if you need support or information about quitting drugs.  Tell your health care provider if you often feel depressed.  Tell your health care provider if you have ever been abused or do not feel safe at home. This information is not intended to replace advice given to you by your health care  provider. Make sure you discuss any questions you have with your health care provider. Document Released: 11/25/2007 Document Revised: 01/26/2016 Document Reviewed: 03/02/2015 Elsevier Interactive Patient Education  2018 [employer].     Why follow it? Research shows. . Those who follow the Mediterranean diet have a reduced risk of heart disease  . The diet is associated with a reduced incidence of Parkinson's and Alzheimer's diseases . People following the diet may have longer life expectancies and lower rates of chronic diseases  . The Dietary Guidelines for Americans recommends the Mediterranean diet as an eating plan to promote health and prevent disease  What Is the Mediterranean Diet?  . Healthy eating plan based on typical foods and recipes of Mediterranean-style cooking . The diet is primarily a plant based diet; these foods should make up a majority of meals   Starches - Plant based foods should make up a majority of meals - They are an important sources of vitamins, minerals, energy, antioxidants, and fiber - Choose whole grains, foods high in fiber and minimally processed items  - Typical grain sources include wheat, oats, barley, corn, brown rice, bulgar, farro, millet, polenta, couscous  - Various types of beans include chickpeas, lentils, fava beans, black beans, white beans   Fruits  Veggies - Large quantities of antioxidant rich fruits & veggies; 6 or more servings  - Vegetables can be eaten raw or lightly drizzled with oil and cooked  - Vegetables common to the traditional Mediterranean Diet include: artichokes, arugula, beets, broccoli, brussel sprouts, cabbage, carrots, celery, collard greens, cucumbers, eggplant, kale, leeks, lemons, lettuce, mushrooms, okra, onions, peas, peppers, potatoes, pumpkin, radishes, rutabaga, shallots, spinach, sweet potatoes, turnips, zucchini - Fruits common to the Mediterranean Diet include: apples, apricots, avocados, cherries, clementines,  dates, figs, grapefruits, grapes, melons, nectarines, oranges, peaches, pears, pomegranates, strawberries, tangerines  Fats - Replace butter and margarine with healthy oils, such as olive oil, canola oil, and tahini  - Limit nuts to no more than a handful a day  - Nuts include walnuts, almonds, pecans, pistachios, pine nuts  - Limit or avoid candied, honey roasted or heavily salted nuts - Olives are central to the [employer] - can be eaten whole or used in a variety of dishes   Meats Protein - Limiting red meat: no more than a few times a month - When eating red meat: choose lean  cuts and keep the portion to the size of deck of cards - Eggs: approx. 0 to 4 times a week  - Fish and lean poultry: at least 2 a week  - Healthy protein sources include, chicken, [place], lean beef, lamb - Increase intake of seafood such as tuna, salmon, trout, mackerel, shrimp, scallops - Avoid or limit high fat processed meats such as sausage and bacon  Dairy - Include moderate amounts of low fat dairy products  - Focus on healthy dairy such as fat free yogurt, skim milk, low or reduced fat cheese - Limit dairy products higher in fat such as whole or 2% milk, cheese, ice cream  Alcohol - Moderate amounts of red wine is ok  - No more than 5 oz daily for women (all ages) and men older than age 56  - No more than 10 oz of wine daily for men younger than 21  Other - Limit sweets and other desserts  - Use herbs and spices instead of salt to flavor foods  - Herbs and spices common to the traditional Mediterranean Diet include: basil, bay leaves, chives, cloves, cumin, fennel, garlic, lavender, marjoram, mint, oregano, parsley, pepper, rosemary, sage, savory, sumac, tarragon, thyme   It's not just a diet, it's a lifestyle:  . The Mediterranean diet includes lifestyle factors typical of those in the region  . Foods, drinks and meals are best eaten with others and savored . Daily physical activity is important for  overall good health . This could be strenuous exercise like running and aerobics . This could also be more leisurely activities such as walking, housework, yard-work, or taking the stairs . Moderation is the key; a balanced and healthy diet accommodates most foods and drinks . Consider portion sizes and frequency of consumption of certain foods   Meal Ideas & Options:  . Breakfast:  o Whole wheat toast or whole wheat English muffins with peanut butter & hard boiled egg o Steel cut oats topped with apples & cinnamon and skim milk  o Fresh fruit: banana, strawberries, melon, berries, peaches  o Smoothies: strawberries, bananas, greek yogurt, peanut butter o Low fat greek yogurt with blueberries and granola  o Egg white omelet with spinach and mushrooms o Breakfast couscous: whole wheat couscous, apricots, skim milk, cranberries  . Sandwiches:  o Hummus and grilled vegetables (peppers, zucchini, squash) on whole wheat bread   o Grilled chicken on whole wheat pita with lettuce, tomatoes, cucumbers or tzatziki  o Tuna salad on whole wheat bread: tuna salad made with greek yogurt, olives, red peppers, capers, green onions o Garlic rosemary lamb pita: lamb sauted with garlic, rosemary, salt & pepper; add lettuce, cucumber, greek yogurt to pita - flavor with lemon juice and black pepper  . Seafood:  o Mediterranean grilled salmon, seasoned with garlic, basil, parsley, lemon juice and black pepper o Shrimp, lemon, and spinach whole-grain pasta salad made with low fat greek yogurt  o Seared scallops with lemon orzo  o Seared tuna steaks seasoned salt, pepper, coriander topped with tomato mixture of olives, tomatoes, olive oil, minced garlic, parsley, green onions and cappers  . Meats:  o Herbed greek chicken salad with kalamata olives, cucumber, feta  o Red bell peppers stuffed with spinach, bulgur, lean ground beef (or lentils) & topped with feta   o Kebabs: skewers of chicken, tomatoes, onions,  zucchini, squash  o [place] burgers: made with red onions, mint, dill, lemon juice, feta cheese topped with roasted red peppers .  Vegetarian o Cucumber salad: cucumbers, artichoke hearts, celery, red onion, feta cheese, tossed in olive oil & lemon juice  o Hummus and whole grain pita points with a greek salad (lettuce, tomato, feta, olives, cucumbers, red onion) o Lentil soup with celery, carrots made with vegetable broth, garlic, salt and pepper  o Tabouli salad: parsley, bulgur, mint, scallions, cucumbers, tomato, radishes, lemon juice, olive oil, salt and pepper.

## 2016-12-04 NOTE — Assessment & Plan Note (Signed)
Stable and managed by neurology. Continue current dosage of carbidopa-levidopa and Requip. Follow up and changes per neurology.

## 2016-12-04 NOTE — Assessment & Plan Note (Signed)
Obtain lipid profile. Continue to hold atorvastatin. Recommend lifestyle management. Follow-up pending lipid profile results.

## 2016-12-04 NOTE — Assessment & Plan Note (Signed)
Reviewed and updated patient's medical, surgical, family and social history. Medications and allergies were also reviewed. Basic screenings for depression, activities of daily living, hearing, cognition and safety were performed. Provider list was updated and health plan was provided to the patient.   Declines Pneumovax and Prevnar. All other immunizations are up-to-date per recommendations. Due for dental exam encouraged to be completed independently. PSA for prostate cancer screening completed by urology. Obtain hepatitis C antibody for hepatitis C screening. Colon cancer screening is up-to-date per recommendations.

## 2016-12-04 NOTE — Progress Notes (Signed)
Subjective:    Patient ID: Kyle Ball, male    DOB: 1945/10/13, 71 y.o.   MRN: [id number]  Chief Complaint  Patient presents with  . Establish Care    CPE, not fasting, feet and ankle swelling, blood work to make sure it is not one of the parkinsons medications    HPI:  Kyle Ball is a 71 y.o. male who presents today for a Medicare Annual Wellness/Physical exam.    1) Health Maintenance -   Diet - Averages about 2-3 meals per day consisting of a regular diet; Caffeine intake of 1-2 cups daily  Exercise - Boxing on Tuesday/Thursday; Parkinson's Exercise on Wednesday; occasional at home exercise  2) Preventative Exams / Immunizations:  Dental -- Due for exam  Vision -- Up to date   Health Maintenance  Topic Date Due  . Hepatitis C Screening  Dec 29, 1945  . PNA vac Low Risk Adult (1 of 2 - PCV13) 12/27/2012  . INFLUENZA VACCINE  01/10/2017  . TETANUS/TDAP  06/21/2020  . COLONOSCOPY  03/20/2024     Immunization History  Administered Date(s) Administered  . Influenza Whole 04/19/2000  . Td 07/13/1996, 06/21/2010    RISK FACTORS  Tobacco History  Smoking Status  . Never Smoker  Smokeless Tobacco  . Never Used     Cardiac risk factors: advanced age (older than 73 for men, 55 for women), dyslipidemia, male gender and obesity (BMI >= 30 kg/m2).  Depression Screen  Depression screen [facility] 2/9 03/05/2013  Decreased Interest 0  Down, Depressed, Hopeless 0  PHQ - 2 Score 0     Activities of Daily Living In your present state of health, do you have any difficulty performing the following activities?:  Driving? No Managing money?  No Feeding yourself? No Getting from bed to chair? No Climbing a flight of stairs? No Preparing food and eating?: No Bathing or showering? No Getting dressed: No Getting to the toilet? No Using the toilet: No Moving around from place to place: No In the past year have you fallen or had a near fall?:No   Home  Safety Has smoke detector and wears seat belts. No firearms. No excess sun exposure. Are there smokers in your home (other than you)?  No Do you feel safe at home?  Yes  Hearing Difficulties: No Do you often ask people to speak up or repeat themselves? No Do you experience ringing or noises in your ears? No  Do you have difficulty understanding soft or whispered voices? No    Cognitive Testing  Alert? Yes   Normal Appearance? Yes  Oriented to person? Yes  Place? Yes   Time? Yes  Recall of three objects?  Yes  Can perform simple calculations? Yes  Displays appropriate judgment? Yes  Can read the correct time from a watch face? Yes  Do you feel that you have a problem with memory? No  Do you often misplace items? No   Advanced Directives have been discussed with the patient? Yes   Current Physicians/Providers and Suppliers  1. Terri Piedra, FNP - Internal Medicine 2. Eldridge Abrahams, MD - Neurology 3. Silvano Rusk, MD - Gastroenterology 4. Barry Brunner, PT - Physical Therapy 5. Caprice Beaver, MD - Sleep Medicine   Indicate any recent Medical Services you may have received from other than [facility] providers in the past year (date may be approximate).  All answers were reviewed with the patient and necessary referrals were made:  Mauricio Po, [place]  12/04/2016    Allergies  Allergen Reactions  . Penicillins     Rash age 80 with PCN injection. Because of a history of documented adverse serious drug reaction;Medi Alert bracelet  is recommended     Outpatient Medications Prior to Visit  Medication Sig Dispense Refill  . aspirin 81 MG tablet Take 81 mg by mouth daily.      . Carbidopa-Levodopa ER (SINEMET CR) 25-100 MG tablet controlled release 3 (three) times daily.     . citalopram (CELEXA) 20 MG tablet Take 30 mg by mouth daily.     Marland Kitchen dutasteride (AVODART) 0.5 MG capsule Take 0.5 mg by mouth daily.    . Probiotic Product (PROBIOTIC PO) Take 1 tablet by mouth daily.    Marland Kitchen  rOPINIRole (REQUIP) 4 MG tablet Take by mouth. Take 2 by mouth in the am, 2 by mouth mid-day.    . Tamsulosin HCl (FLOMAX) 0.4 MG CAPS Take 0.4 mg by mouth daily.      Marland Kitchen VIAGRA 100 MG tablet as needed.  0  . atorvastatin (LIPITOR) 20 MG tablet Take 20 mg by mouth daily.       No facility-administered medications prior to visit.      Past Medical History:  Diagnosis Date  . Anxiety and depression   . BPH (benign prostatic hypertrophy)   . Crohn's disease ([place]) 1979   After resection, 1 flare in 2012.  Marland Kitchen Dysmetabolic syndrome X   . ED (erectile dysfunction)   . Hyperlipidemia   . Internal hemorrhoids   . Parkinson's disease ([place])    [facility]     Past Surgical History:  Procedure Laterality Date  . COLONOSCOPY W/ BIOPSIES   06/2007, 2015   Crohn's, internal hemorrhoids, Dr Carlean Purl  . KNEE ARTHROSCOPY Right 06/2013  . PROSTATE BIOPSY     X 2, Dr Risa Grill  . PTNS Treatment     urinary incontinence  . QUADRICEPS REPAIR Right 1966    knee  . REFRACTIVE SURGERY  2015   Dr Bing Plume  . Terminal Ileum & Appendix Resected  1995   Crohn's     Family History  Problem Relation Age of Onset  . Uterine cancer Mother        metastatic to liver  . Hypertension Father   . Diabetes Father   . Stroke Father 90  . Heart attack Father 31  . Testicular cancer Brother 55  . Fibromyalgia Sister   . Mental illness Sister        chronic depression     Social History   Social History  . Marital status: Single    Spouse name: N/A  . Number of children: 2  . Years of education: 86   Occupational History  . Retired  [employer]    RETIRED   Social History Main Topics  . Smoking status: Never Smoker  . Smokeless tobacco: Never Used  . Alcohol use 8.4 oz/week    14 Glasses of wine per week  . Drug use: No  . Sexual activity: Not on file   Other Topics Concern  . Not on file   Social History Narrative   Fun/Hobby: Working around the house and fixing things      Review of  Systems  Constitutional: Denies fever, chills, fatigue, or significant weight gain/loss. HENT: Head: Denies headache or neck pain Ears: Denies changes in hearing, ringing in ears, earache, drainage Nose: Denies discharge, stuffiness, itching, nosebleed, sinus pain Throat: Denies sore throat, hoarseness,  dry mouth, sores, thrush Eyes: Denies loss/changes in vision, pain, redness, blurry/double vision, flashing lights Cardiovascular: Denies chest pain/discomfort, tightness, palpitations, shortness of breath with activity, difficulty lying down, swelling, sudden awakening with shortness of breath Respiratory: Denies shortness of breath, cough, sputum production, wheezing Gastrointestinal: Denies dysphasia, heartburn, change in appetite, nausea, change in bowel habits, rectal bleeding, constipation, diarrhea, yellow skin or eyes Genitourinary: Denies frequency, urgency, burning/pain, blood in urine, incontinence, change in urinary strength. Musculoskeletal: Denies muscle/joint pain, stiffness, back pain, redness or swelling of joints, trauma Skin: Denies rashes, lumps, itching, dryness, color changes, or hair/nail changes Neurological: Denies dizziness, fainting, seizures, weakness, numbness, tingling, tremor Psychiatric - Denies nervousness, stress, depression or memory loss Endocrine: Denies heat or cold intolerance, sweating, frequent urination, excessive thirst, changes in appetite Hematologic: Denies ease of bruising or bleeding    Objective:     BP 132/80 (BP Location: Left Arm, Patient Position: Sitting, Cuff Size: Large)   Pulse 64   Temp 98.5 F (36.9 C) (Oral)   Resp 16   Ht 6' (1.829 m)   Wt 263 lb 6.4 oz (119.5 kg)   SpO2 96%   BMI 35.72 kg/m  Nursing note and vital signs reviewed.  Physical Exam  Constitutional: He is oriented to person, place, and time. He appears well-developed and well-nourished.  HENT:  Head: Normocephalic.  Right Ear: Hearing, tympanic membrane,  external ear and ear canal normal.  Left Ear: Hearing, tympanic membrane, external ear and ear canal normal.  Nose: Nose normal.  Mouth/Throat: Uvula is midline, oropharynx is clear and moist and mucous membranes are normal.  Eyes: Conjunctivae and EOM are normal. Pupils are equal, round, and reactive to light.  Neck: Neck supple. No JVD present. No tracheal deviation present. No thyromegaly present.  Cardiovascular: Normal rate, regular rhythm, normal heart sounds and intact distal pulses.   Pulmonary/Chest: Effort normal and breath sounds normal.  Abdominal: Soft. Bowel sounds are normal. He exhibits no distension and no mass. There is no tenderness. There is no rebound and no guarding.  Musculoskeletal: Normal range of motion. He exhibits no edema or tenderness.  Lymphadenopathy:    He has no cervical adenopathy.  Neurological: He is alert and oriented to person, place, and time. He has normal reflexes. No cranial nerve deficit. He exhibits normal muscle tone. Coordination normal.  Skin: Skin is warm and dry.  Psychiatric: He has a normal mood and affect. His behavior is normal. Judgment and thought content normal.       Assessment & Plan:   During the course of the visit the patient was educated and counseled about appropriate screening and preventive services including:    Prostate cancer screening  Colorectal cancer screening  Diabetes screening  Nutrition counseling   Diet review for nutrition referral? Yes ____  Not Indicated _X___   Patient Instructions (the written plan) was given to the patient.  Medicare Attestation I have personally reviewed: The patient's medical and social history Their use of alcohol, tobacco or illicit drugs Their current medications and supplements The patient's functional ability including ADLs,fall risks, home safety risks, cognitive, and hearing and visual impairment Diet and physical activities Evidence for depression or mood  disorders  The patient's weight, height, BMI,  have been recorded in the chart.  I have made referrals, counseling, and provided education to the patient based on review of the above and I have provided the patient with a written personalized care plan for preventive services.     Problem  List Items Addressed This Visit      Digestive   Crohn's disease of ileum with intestinal obstruction ([place])    Stable with no current exacerbation. Continue to monitor.        Nervous and Auditory   Parkinson's disease ([place])    Stable and managed by neurology. Continue current dosage of carbidopa-levidopa and Requip. Follow up and changes per neurology.        Genitourinary   BPH (benign prostatic hyperplasia)    Stable and managed by urology. Continue current dosage of tamsulosin with changes and follow-up per urology.        Other   HYPERLIPIDEMIA    Obtain lipid profile. Continue to hold atorvastatin. Recommend lifestyle management. Follow-up pending lipid profile results.      Relevant Orders   CBC   Comprehensive metabolic panel   Lipid panel   Medicare annual wellness visit, subsequent - Primary    Reviewed and updated patient's medical, surgical, family and social history. Medications and allergies were also reviewed. Basic screenings for depression, activities of daily living, hearing, cognition and safety were performed. Provider list was updated and health plan was provided to the patient.   Declines Pneumovax and Prevnar. All other immunizations are up-to-date per recommendations. Due for dental exam encouraged to be completed independently. PSA for prostate cancer screening completed by urology. Obtain hepatitis C antibody for hepatitis C screening. Colon cancer screening is up-to-date per recommendations.       Other Visit Diagnoses    Other problems related to lifestyle       Relevant Orders   Hepatitis C Antibody       I have discontinued Mr. Schnitzler atorvastatin. I  am also having him maintain his aspirin, tamsulosin, rOPINIRole, citalopram, dutasteride, Carbidopa-Levodopa ER, VIAGRA, and Probiotic Product (PROBIOTIC PO).   Follow-up: Return if symptoms worsen or fail to improve.   Mauricio Po, FNP

## 2016-12-04 NOTE — Assessment & Plan Note (Signed)
Stable with no current exacerbation. Continue to monitor.

## 2016-12-04 NOTE — Assessment & Plan Note (Signed)
Stable and managed by urology. Continue current dosage of tamsulosin with changes and follow-up per urology.

## 2016-12-08 ENCOUNTER — Encounter: Payer: Self-pay | Admitting: Physical Therapy

## 2016-12-08 NOTE — Therapy (Signed)
[facility] [address] [place], [place], [address] Phone: [phone]   Fax:  [phone]  Patient Details  Name: Kyle Ball MRN: [id number] Date of Birth: 09/21/1945 Referring Provider:  No ref. provider found  Encounter Date: 12/08/2016   PHYSICAL THERAPY DISCHARGE SUMMARY  Visits from Start of Care: 7  Current functional level related to goals / functional outcomes: See LTG check-pt has partially met LTGs, and was renewed to continue PT at 08/23/16 visit; however, pt did not return to PT after 08/23/16 visit.   Remaining deficits: Balance, transfers, gait   Education / Equipment: Educated in [employer], fall prevention  Plan: Patient agrees to discharge.  Patient goals were partially met. Patient is being discharged due to not returning since the last visit.  ?????     Dot Been., PT  12/08/2016, 12:30 PM  [facility] [address] [place], [place], [address] Phone: [phone]   Fax:  [phone]

## 2016-12-19 ENCOUNTER — Encounter: Payer: Self-pay | Admitting: Internal Medicine

## 2016-12-19 ENCOUNTER — Ambulatory Visit (AMBULATORY_SURGERY_CENTER): Payer: Medicare Other | Admitting: Internal Medicine

## 2016-12-19 VITALS — BP 124/76 | HR 65 | Temp 96.0°F | Resp 17 | Ht 72.0 in | Wt 264.0 lb

## 2016-12-19 DIAGNOSIS — K913 Postprocedural intestinal obstruction, unspecified as to partial versus complete: Secondary | ICD-10-CM | POA: Diagnosis not present

## 2016-12-19 DIAGNOSIS — K50012 Crohn's disease of small intestine with intestinal obstruction: Secondary | ICD-10-CM

## 2016-12-19 DIAGNOSIS — K6389 Other specified diseases of intestine: Secondary | ICD-10-CM | POA: Diagnosis not present

## 2016-12-19 DIAGNOSIS — K5090 Crohn's disease, unspecified, without complications: Secondary | ICD-10-CM | POA: Diagnosis not present

## 2016-12-19 MED ORDER — SODIUM CHLORIDE 0.9 % IV SOLN: 500.0000 mL | INTRAVENOUS | Status: DC

## 2016-12-19 NOTE — Progress Notes (Signed)
I Pt's states no medical or surgical changes since previsit or office visit.

## 2016-12-19 NOTE — Patient Instructions (Addendum)
   I dilated the stricture (narrowing).  Let's see what happens - I hope it helps.  We will get you an office visit to see me and also arrange follow-up by phone.  Kyle Mayer, MD, FACG   YOU HAD AN ENDOSCOPIC PROCEDURE TODAY: Refer to the procedure report and other information in the discharge instructions given to you for any specific questions about what was found during the examination. If this information does not answer your questions, please call [facility] office at [phone] to clarify.   YOU SHOULD EXPECT: Some feelings of bloating in the abdomen. Passage of more gas than usual. Walking can help get rid of the air that was put into your GI tract during the procedure and reduce the bloating. If you had a lower endoscopy (such as a colonoscopy or flexible sigmoidoscopy) you may notice spotting of blood in your stool or on the toilet paper. Some abdominal soreness may be present for a day or two, also.  DIET: Your first meal following the procedure should be a light meal and then it is ok to progress to your normal diet. A half-sandwich or bowl of soup is an example of a good first meal. Heavy or fried foods are harder to digest and may make you feel nauseous or bloated. Drink plenty of fluids but you should avoid alcoholic beverages for 24 hours. If you had a esophageal dilation, please see attached instructions for diet.    ACTIVITY: Your care partner should take you home directly after the procedure. You should plan to take it easy, moving slowly for the rest of the day. You can resume normal activity the day after the procedure however YOU SHOULD NOT DRIVE, use power tools, machinery or perform tasks that involve climbing or major physical exertion for 24 hours (because of the sedation medicines used during the test).   SYMPTOMS TO REPORT IMMEDIATELY: A gastroenterologist can be reached at any hour. Please call [phone]  for any of the following symptoms:  Following lower  endoscopy (colonoscopy, flexible sigmoidoscopy) Excessive amounts of blood in the stool  Significant tenderness, worsening of abdominal pains  Swelling of the abdomen that is new, acute  Fever of 100 or higher    FOLLOW UP:  If any biopsies were taken you will be contacted by phone or by letter within the next 1-3 weeks. Call [phone]  if you have not heard about the biopsies in 3 weeks.  Please also call with any specific questions about appointments or follow up tests.

## 2016-12-19 NOTE — Op Note (Addendum)
[facility] Patient Name: Kyle Ball Procedure Date: 12/19/2016 7:32 AM MRN: [id number] Endoscopist: Gatha Mayer , MD Age: 71 Referring MD:  Date of Birth: 1946/04/04 Gender: Male Account #: [id number] Procedure:                Colonoscopy Indications:              Crohn's disease of the small bowel, For therapy of                            Crohn's disease of the small bowel Medicines:                Propofol per Anesthesia, Monitored Anesthesia Care Procedure:                Pre-Anesthesia Assessment:                           - Prior to the procedure, a History and Physical                            was performed, and patient medications and                            allergies were reviewed. The patient's tolerance of                            previous anesthesia was also reviewed. The risks                            and benefits of the procedure and the sedation                            options and risks were discussed with the patient.                            All questions were answered, and informed consent                            was obtained. Prior Anticoagulants: The patient has                            taken no previous anticoagulant or antiplatelet                            agents. ASA Grade Assessment: II - A patient with                            mild systemic disease. After reviewing the risks                            and benefits, the patient was deemed in                            satisfactory condition to undergo the procedure.  After obtaining informed consent, the colonoscope                            was passed under direct vision. Throughout the                            procedure, the patient's blood pressure, pulse, and                            oxygen saturations were monitored continuously. The                            Model CF-HQ190L [id number]) scope was introduced   through the anus and advanced to the the                            ileocolonic anastomosis. The colonoscopy was                            performed without difficulty. The patient tolerated                            the procedure well. The quality of the bowel                            preparation was excellent. The terminal ileum, the                            rectum and Ileocolonic anastomsis areas were                            photographed. Scope In: 7:37:08 AM Scope Out: 8:10:51 AM Scope Withdrawal Time: 0 hours 30 minutes 39 seconds  Total Procedure Duration: 0 hours 33 minutes 43 seconds  Findings:                 The perianal and digital rectal examinations were                            normal. Pertinent negatives include normal prostate                            (size, shape, and consistency).                           The ileal surgical anastomosis contained a                            benign-appearing, intrinsic severe stenosis                            measuring less than one cm (in length) x 8 mm                            (inner diameter) that was non-traversed. A TTS  dilator was passed through the scope. Dilation with                            an 8.5-9.5-10.5 mm, an 04-23-12 mm and a                            13.5-14.5-15.5 mm colonic balloon dilator was                            performed. The dilation site was examined and                            showed moderate improvement in luminal narrowing.                            Estimated blood loss was minimal.                           The exam was otherwise without abnormality on                            direct and retroflexion views. Complications:            No immediate complications. Estimated Blood Loss:     Estimated blood loss was minimal. 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                            Dilated.                            - The examination was otherwise normal on direct                            and retroflexion views.                           - No specimens collected. Recommendation:           - Patient has a contact number available for                            emergencies. The signs and symptoms of potential                            delayed complications were discussed with the                            patient. Return to normal activities tomorrow.                            Written discharge instructions were provided to the                            patient.                           -  Resume previous diet.                           - Continue present medications.                           - No recommendation at this time regarding repeat                            colonoscopy.                           - Will f/u by phone in 1 week he/wife to call me                            with an update                           anticipate office visit to be arranged vs repeat                            dilation Gatha Mayer, MD 12/19/2016 8:24:19 AM This report has been signed electronically.

## 2016-12-19 NOTE — Progress Notes (Signed)
Alert and oriented x3, pleased with MAC, report to RN  

## 2016-12-19 NOTE — Progress Notes (Signed)
Called to room to assist during endoscopic procedure.  Patient ID and intended procedure confirmed with present staff. Received instructions for my participation in the procedure from the performing physician.  

## 2016-12-20 ENCOUNTER — Telehealth: Payer: Self-pay | Admitting: *Deleted

## 2016-12-20 NOTE — Telephone Encounter (Signed)
  Follow up Call-  Call back number 12/19/2016  Post procedure Call Back phone  # [phone]   [phone]  Permission to leave phone message Yes  Some recent data might be hidden     Patient questions:  Do you have a fever, pain , or abdominal swelling? No. Pain Score  0 *  Have you tolerated food without any problems? Yes.    Have you been able to return to your normal activities? Yes.    Do you have any questions about your discharge instructions: Diet   No. Medications  No. Follow up visit  Yes.    Do you have questions or concerns about your Care? No.  Actions: * If pain score is 4 or above: No action needed, pain <4.

## 2016-12-20 NOTE — Telephone Encounter (Signed)
No answer. Left message to call if questions or concerns. 

## 2016-12-28 DIAGNOSIS — N3941 Urge incontinence: Secondary | ICD-10-CM | POA: Diagnosis not present

## 2017-01-03 ENCOUNTER — Other Ambulatory Visit (INDEPENDENT_AMBULATORY_CARE_PROVIDER_SITE_OTHER): Payer: Medicare Other

## 2017-01-03 DIAGNOSIS — E782 Mixed hyperlipidemia: Secondary | ICD-10-CM

## 2017-01-03 DIAGNOSIS — Z7289 Other problems related to lifestyle: Secondary | ICD-10-CM | POA: Diagnosis not present

## 2017-01-03 LAB — CBC
HCT: 47.8 % (ref 39.0–52.0)
Hemoglobin: 15.9 g/dL (ref 13.0–17.0)
MCHC: 33.3 g/dL (ref 30.0–36.0)
MCV: 85.3 fl (ref 78.0–100.0)
PLATELETS: 200 10*3/uL (ref 150.0–400.0)
RBC: 5.6 Mil/uL (ref 4.22–5.81)
RDW: 13.6 % (ref 11.5–15.5)
WBC: 6.1 10*3/uL (ref 4.0–10.5)

## 2017-01-03 LAB — LIPID PANEL
CHOLESTEROL: 211 mg/dL — AB (ref 0–200)
HDL: 36.4 mg/dL — ABNORMAL LOW (ref >39.00)
LDL Cholesterol: 143 mg/dL — ABNORMAL HIGH (ref 0–99)
NonHDL: 174.97
TRIGLYCERIDES: 159 mg/dL — AB (ref 0.0–149.0)
Total CHOL/HDL Ratio: 6
VLDL: 31.8 mg/dL (ref 0.0–40.0)

## 2017-01-03 LAB — COMPREHENSIVE METABOLIC PANEL
ALBUMIN: 4.1 g/dL (ref 3.5–5.2)
ALK PHOS: 73 U/L (ref 39–117)
ALT: 18 U/L (ref 0–53)
AST: 13 U/L (ref 0–37)
BILIRUBIN TOTAL: 0.6 mg/dL (ref 0.2–1.2)
BUN: 14 mg/dL (ref 6–23)
CALCIUM: 9.3 mg/dL (ref 8.4–10.5)
CHLORIDE: 105 meq/L (ref 96–112)
CO2: 29 mEq/L (ref 19–32)
CREATININE: 0.83 mg/dL (ref 0.40–1.50)
GFR: 97.63 mL/min (ref >60.00)
Glucose, Bld: 92 mg/dL (ref 70–99)
Potassium: 3.8 mEq/L (ref 3.5–5.1)
Sodium: 141 mEq/L (ref 135–145)
TOTAL PROTEIN: 6.9 g/dL (ref 6.0–8.3)

## 2017-01-04 DIAGNOSIS — N3941 Urge incontinence: Secondary | ICD-10-CM | POA: Diagnosis not present

## 2017-01-04 LAB — HEPATITIS C ANTIBODY: HCV Ab: NEGATIVE

## 2017-01-05 ENCOUNTER — Other Ambulatory Visit: Payer: Self-pay | Admitting: Family

## 2017-01-05 DIAGNOSIS — G20 Parkinson's disease: Secondary | ICD-10-CM | POA: Diagnosis not present

## 2017-01-05 DIAGNOSIS — G4733 Obstructive sleep apnea (adult) (pediatric): Secondary | ICD-10-CM | POA: Diagnosis not present

## 2017-01-05 DIAGNOSIS — Z79899 Other long term (current) drug therapy: Secondary | ICD-10-CM | POA: Diagnosis not present

## 2017-01-05 DIAGNOSIS — K5000 Crohn's disease of small intestine without complications: Secondary | ICD-10-CM | POA: Diagnosis not present

## 2017-01-05 DIAGNOSIS — Z9989 Dependence on other enabling machines and devices: Secondary | ICD-10-CM | POA: Diagnosis not present

## 2017-01-05 DIAGNOSIS — G4752 REM sleep behavior disorder: Secondary | ICD-10-CM | POA: Diagnosis not present

## 2017-01-05 DIAGNOSIS — R030 Elevated blood-pressure reading, without diagnosis of hypertension: Secondary | ICD-10-CM | POA: Diagnosis not present

## 2017-01-05 DIAGNOSIS — R739 Hyperglycemia, unspecified: Secondary | ICD-10-CM | POA: Diagnosis not present

## 2017-01-05 DIAGNOSIS — E669 Obesity, unspecified: Secondary | ICD-10-CM | POA: Diagnosis not present

## 2017-01-05 DIAGNOSIS — Z6833 Body mass index (BMI) 33.0-33.9, adult: Secondary | ICD-10-CM | POA: Diagnosis not present

## 2017-01-05 DIAGNOSIS — E785 Hyperlipidemia, unspecified: Secondary | ICD-10-CM | POA: Diagnosis not present

## 2017-01-05 MED ORDER — ATORVASTATIN CALCIUM 20 MG PO TABS: 20.0000 mg | ORAL_TABLET | Freq: Every day | ORAL | 1 refills | Status: DC

## 2017-01-25 DIAGNOSIS — N3941 Urge incontinence: Secondary | ICD-10-CM | POA: Diagnosis not present

## 2017-02-21 DIAGNOSIS — G4733 Obstructive sleep apnea (adult) (pediatric): Secondary | ICD-10-CM | POA: Diagnosis not present

## 2017-03-08 DIAGNOSIS — N3941 Urge incontinence: Secondary | ICD-10-CM | POA: Diagnosis not present

## 2017-04-04 DIAGNOSIS — F329 Major depressive disorder, single episode, unspecified: Secondary | ICD-10-CM | POA: Diagnosis not present

## 2017-04-04 DIAGNOSIS — G20 Parkinson's disease: Secondary | ICD-10-CM | POA: Diagnosis not present

## 2017-04-04 DIAGNOSIS — Z9989 Dependence on other enabling machines and devices: Secondary | ICD-10-CM | POA: Diagnosis not present

## 2017-04-04 DIAGNOSIS — G4733 Obstructive sleep apnea (adult) (pediatric): Secondary | ICD-10-CM | POA: Diagnosis not present

## 2017-04-04 DIAGNOSIS — K5090 Crohn's disease, unspecified, without complications: Secondary | ICD-10-CM | POA: Diagnosis not present

## 2017-04-04 DIAGNOSIS — E785 Hyperlipidemia, unspecified: Secondary | ICD-10-CM | POA: Diagnosis not present

## 2017-04-04 DIAGNOSIS — N400 Enlarged prostate without lower urinary tract symptoms: Secondary | ICD-10-CM | POA: Diagnosis not present

## 2017-04-05 DIAGNOSIS — N3941 Urge incontinence: Secondary | ICD-10-CM | POA: Diagnosis not present

## 2017-04-17 DIAGNOSIS — F3341 Major depressive disorder, recurrent, in partial remission: Secondary | ICD-10-CM | POA: Diagnosis not present

## 2017-04-17 DIAGNOSIS — F900 Attention-deficit hyperactivity disorder, predominantly inattentive type: Secondary | ICD-10-CM | POA: Diagnosis not present

## 2017-04-23 ENCOUNTER — Other Ambulatory Visit: Payer: Self-pay

## 2017-04-23 MED ORDER — ATORVASTATIN CALCIUM 20 MG PO TABS: 20.0000 mg | ORAL_TABLET | Freq: Every day | ORAL | 0 refills | Status: AC

## 2017-04-26 DIAGNOSIS — N3941 Urge incontinence: Secondary | ICD-10-CM | POA: Diagnosis not present

## 2017-05-25 DIAGNOSIS — N3941 Urge incontinence: Secondary | ICD-10-CM | POA: Diagnosis not present

## 2017-05-31 DIAGNOSIS — G20 Parkinson's disease: Secondary | ICD-10-CM | POA: Diagnosis not present

## 2017-05-31 DIAGNOSIS — K5900 Constipation, unspecified: Secondary | ICD-10-CM | POA: Diagnosis not present

## 2017-05-31 DIAGNOSIS — G4730 Sleep apnea, unspecified: Secondary | ICD-10-CM | POA: Diagnosis not present

## 2017-05-31 DIAGNOSIS — Z6831 Body mass index (BMI) 31.0-31.9, adult: Secondary | ICD-10-CM | POA: Diagnosis not present

## 2017-05-31 DIAGNOSIS — R296 Repeated falls: Secondary | ICD-10-CM | POA: Diagnosis not present

## 2017-05-31 DIAGNOSIS — Z880 Allergy status to penicillin: Secondary | ICD-10-CM | POA: Diagnosis not present

## 2017-05-31 DIAGNOSIS — Z79899 Other long term (current) drug therapy: Secondary | ICD-10-CM | POA: Diagnosis not present

## 2017-05-31 DIAGNOSIS — E782 Mixed hyperlipidemia: Secondary | ICD-10-CM | POA: Diagnosis not present

## 2017-05-31 DIAGNOSIS — E669 Obesity, unspecified: Secondary | ICD-10-CM | POA: Diagnosis not present

## 2017-06-14 DIAGNOSIS — N3941 Urge incontinence: Secondary | ICD-10-CM | POA: Diagnosis not present

## 2017-06-28 DIAGNOSIS — G4731 Primary central sleep apnea: Secondary | ICD-10-CM | POA: Diagnosis not present

## 2017-06-28 DIAGNOSIS — R0689 Other abnormalities of breathing: Secondary | ICD-10-CM | POA: Diagnosis not present

## 2017-07-05 DIAGNOSIS — N3941 Urge incontinence: Secondary | ICD-10-CM | POA: Diagnosis not present

## 2017-07-23 ENCOUNTER — Ambulatory Visit: Payer: Medicare Other | Attending: Nurse Practitioner | Admitting: Physical Therapy

## 2017-07-23 ENCOUNTER — Encounter: Payer: Self-pay | Admitting: Physical Therapy

## 2017-07-23 DIAGNOSIS — R2689 Other abnormalities of gait and mobility: Secondary | ICD-10-CM | POA: Diagnosis not present

## 2017-07-23 DIAGNOSIS — M6281 Muscle weakness (generalized): Secondary | ICD-10-CM | POA: Insufficient documentation

## 2017-07-23 DIAGNOSIS — R29818 Other symptoms and signs involving the nervous system: Secondary | ICD-10-CM

## 2017-07-23 DIAGNOSIS — R2681 Unsteadiness on feet: Secondary | ICD-10-CM | POA: Diagnosis not present

## 2017-07-23 DIAGNOSIS — R293 Abnormal posture: Secondary | ICD-10-CM | POA: Diagnosis not present

## 2017-07-23 NOTE — Therapy (Signed)
[facility] [address] [place], [place], [address] Phone: [phone]   Fax:  [phone]  Physical Therapy Evaluation  Patient Details  Name: Kyle Ball MRN: [id number] Date of Birth: 1945-06-19 Referring Provider: Pura Spice   Encounter Date: 07/23/2017  PT End of Session - 07/23/17 1705    Visit Number  1    Number of Visits  17    Date for PT Re-Evaluation  09/21/17    Authorization Type  Medicare and BCBS    PT Start Time  0935    PT Stop Time  1025    PT Time Calculation (min)  50 min    Activity Tolerance  Patient tolerated treatment well    Behavior During Therapy  [facility] for tasks assessed/performed       Past Medical History:  Diagnosis Date  . Anxiety and depression   . BPH (benign prostatic hypertrophy)   . Crohn's disease ([place]) 1979   After resection, 1 flare in 2012.  Marland Kitchen Dysmetabolic syndrome X   . ED (erectile dysfunction)   . Hyperlipidemia   . Internal hemorrhoids   . Parkinson's disease ([place])    [facility]    Past Surgical History:  Procedure Laterality Date  . COLONOSCOPY W/ BIOPSIES   06/2007, 2015   Crohn's, internal hemorrhoids, Dr Carlean Purl  . KNEE ARTHROSCOPY Right 06/2013  . PROSTATE BIOPSY     X 2, Dr Risa Grill  . PTNS Treatment     urinary incontinence  . QUADRICEPS REPAIR Right 1966    knee  . REFRACTIVE SURGERY  2015   Dr Bing Plume  . Terminal Ileum & Appendix Resected  1995   Crohn's    There were no vitals filed for this visit.   Subjective Assessment - 07/23/17 0941    Subjective  Pt reports that Dr. Linus Mako felt pt would benefit from having additional therapy to work on balance due to decreased scores on the test/measures.  Wife reports increased stooped posture with walking.  Reports 3 falls in the past 6 months.  Fell in bathtub about 6 months ago.  Able to get up from the floor without much difficulty.    Pertinent History  OSA, recent cardiac ultrasounds  (awaiting results)    Patient Stated Goals  Pt's goals for therapy are to improve stooped posture, especially with walking    Currently in Pain?  Yes    Pain Score  6     Pain Location  Back    Pain Orientation  Right;Left;Lower    Pain Descriptors / Indicators  Aching    Pain Type  Chronic pain    Pain Onset  More than a month ago    Pain Frequency  Intermittent    Aggravating Factors   carrying things/heavy lifting, being more stooped with walking    Pain Relieving Factors  sitting down/brief rests    Effect of Pain on Daily Activities  PT will monitor pain; will attempt to address through exercises and posture/positioning education         [facility] PT Assessment - 07/23/17 0954      Assessment   Medical Diagnosis  Parkinson's disease    Referring Provider  Pura Spice    Onset Date/Surgical Date  07/18/17    Prior Therapy  Previous bout of PT 08/2016      Precautions   Precautions  Fall      Balance Screen   Has the patient fallen in  the past 6 months  Yes    How many times?  3    Has the patient had a decrease in activity level because of a fear of falling?   No    Is the patient reluctant to leave their home because of a fear of falling?   No      Home Film/video editor residence    Living Arrangements  Spouse/significant other    Available Help at Discharge  Family    Type of [place] to enter    Entrance Stairs-Number of Steps  4    Entrance Stairs-Rails  None    Home Layout  Two level    [place]  None      Prior Function   Level of Independence  Independent    Vocation  Retired    Leisure  Enjoys going to house at [facility]; goes to [employer] 2x/wk; [employer]! Moves class once per week; walks dog every day (just getting back to doing that)      Observation/Other Assessments   Focus on Therapeutic Outcomes (FOTO)   NA      Posture/Postural Control   Posture/Postural Control  Postural limitations     Postural Limitations  Rounded Shoulders;Forward head;Posterior pelvic tilt      Tone   Assessment Location  Right Lower Extremity;Left Lower Extremity      ROM / Strength   AROM / PROM / Strength  Strength      Strength   Overall Strength Comments  Grossly tested at least 5/5 bilateral lower extremities with R hip flexion, R quads 4/5      Transfers   Transfers  Sit to Stand;Stand to Sit    Sit to Stand  6: Modified independent (Device/Increase time);Without upper extremity assist;From chair/3-in-1    Five time sit to stand comments   17.35    Stand to Sit  6: Modified independent (Device/Increase time);Without upper extremity assist;To chair/3-in-1      Ambulation/Gait   Ambulation/Gait  Yes    Ambulation/Gait Assistance  6: Modified independent (Device/Increase time)    Ambulation Distance (Feet)  200 Feet    Assistive device  None    Gait Pattern  Step-through pattern;Decreased arm swing - right;Decreased arm swing - left;Decreased step length - right;Right flexed knee in stance;Shuffle;Decreased trunk rotation;Trunk flexed;Poor foot clearance - left;Poor foot clearance - right    Ambulation Surface  Level;Indoor    Gait velocity  11.34 sec = 2.89 ft/sec      Standardized Balance Assessment   Standardized Balance Assessment  Timed Up and Go Test      Timed Up and Go Test   TUG  Normal TUG;Manual TUG;Cognitive TUG    Normal TUG (seconds)  13.87    Manual TUG (seconds)  13.67    Cognitive TUG (seconds)  12.85    TUG Comments  Scores >13.5-15 seconds indicate increased fall risk      High Level Balance   High Level Balance Comments  MiniBESTest score:  21/28      RLE Tone   RLE Tone  Mild      LLE Tone   LLE Tone  Mild          Mini-BESTest: Balance Evaluation Systems Test  2005-2013 [employer]. All rights  reserved. ________________________________________________________________________________________Anticipatory_________Subscore___5__/6 1. SIT TO STAND Instruction: "Cross your arms across your chest. Try not to use  your hands unless you must.Do not let your legs lean against the back of the chair when you stand. Please stand up now." X(2) Normal: Comes to stand without use of hands and stabilizes independently. (1) Moderate: Comes to stand WITH use of hands on first attempt. (0) Severe: Unable to stand up from chair without assistance, OR needs several attempts with use of hands. 2. RISE TO TOES Instruction: "Place your feet shoulder width apart. Place your hands on your hips. Try to rise as high as you can onto your toes. I will count out loud to 3 seconds. Try to hold this pose for at least 3 seconds. Look straight ahead. Rise now." X(2) Normal: Stable for 3 s with maximum height. (1) Moderate: Heels up, but not full range (smaller than when holding hands), OR noticeable instability for 3 s. (0) Severe: < 3 s. 3. STAND ON ONE LEG Instruction: "Look straight ahead. Keep your hands on your hips. Lift your leg off of the ground behind you without touching or resting your raised leg upon your other standing leg. Stay standing on one leg as long as you can. Look straight ahead. Lift now." Left: Time in Seconds Trial 1:__8.13___Trial 2:_3.47____ (2) Normal: 20 s. X(1) Moderate: < 20 s. (0) Severe: Unable. Right: Time in Seconds Trial 1:_6.87____Trial 2:__6.03___ (2) Normal: 20 s. X(1) Moderate: < 20 s. (0) Severe: Unable To score each side separately use the trial with the longest time. To calculate the sub-score and total score use the side [left or right] with the lowest numerical score [i.e. the worse side]. ______________________________________________________________________________________Reactive Postural Control___________Subscore:___6__/6 4. COMPENSATORY STEPPING CORRECTION-  FORWARD Instruction: "Stand with your feet shoulder width apart, arms at your sides. Lean forward against my hands beyond your forward limits. When I let go, do whatever is necessary, including taking a step, to avoid a fall." X(2) Normal: Recovers independently with a single, large step (second realignment step is allowed).-SLOWED (1) Moderate: More than one step used to recover equilibrium. (0) Severe: No step, OR would fall if not caught, OR falls spontaneously. 5. COMPENSATORY STEPPING CORRECTION- BACKWARD Instruction: "Stand with your feet shoulder width apart, arms at your sides. Lean backward against my hands beyond your backward limits. When I let go, do whatever is necessary, including taking a step, to avoid a fall." X(2) Normal: Recovers independently with a single, large step.-SLOWED (1) Moderate: More than one step used to recover equilibrium. (0) Severe: No step, OR would fall if not caught, OR falls spontaneously. 6. COMPENSATORY STEPPING CORRECTION- LATERAL Instruction: "Stand with your feet together, arms down at your sides. Lean into my hand beyond your sideways limit. When I let go, do whatever is necessary, including taking a step, to avoid a fall." Left X(2) Normal: Recovers independently with 1 step (crossover or lateral OK). (1) Moderate: Several steps to recover equilibrium. (0) Severe: Falls, or cannot step. Right X(2) Normal: Recovers independently with 1 step (crossover or lateral OK). (1) Moderate: Several steps to recover equilibrium. (0) Severe: Falls, or cannot step. Use the side with the lowest score to calculate sub-score and total score. ____________________________________________________________________________________Sensory Orientation_____________Subscore:______4___/6 7. STANCE (FEET TOGETHER); EYES OPEN, FIRM SURFACE Instruction: "Place your hands on your hips. Place your feet together until almost touching. Look straight ahead. Be as stable and  still as possible, until I say stop." Time in seconds:________ X(2) Normal: 30 s. (1) Moderate: < 30 s. (0) Severe: Unable. 8. STANCE (FEET TOGETHER); EYES CLOSED, FOAM SURFACE Instruction: "Step onto the foam. Place  your hands on your hips. Place your feet together until almost touching. Be as stable and still as possible, until I say stop. I will start timing when you close your eyes." Time in seconds:________ (2) Normal: 30 s. (1) Moderate: < 30 s. X(0) Severe: Unable. 9. INCLINE- EYES CLOSED Instruction: "Step onto the incline ramp. Please stand on the incline ramp with your toes toward the top. Place your feet shoulder width apart and have your arms down at your sides. I will start timing when you close your eyes." Time in seconds:________ X(2) Normal: Stands independently 30 s and aligns with gravity. (1) Moderate: Stands independently <30 s OR aligns with surface. (0) Severe: Unable. _________________________________________________________________________________________Dynamic Gait ______Subscore____6____/10 10. CHANGE IN GAIT SPEED Instruction: "Begin walking at your normal speed, when I tell you 'fast', walk as fast as you can. When I say 'slow', walk very slowly." (2) Normal: Significantly changes walking speed without imbalance. X(1) Moderate: Unable to change walking speed or signs of imbalance. (0) Severe: Unable to achieve significant change in walking speed AND signs of imbalance. [place] - HORIZONTAL Instruction: "Begin walking at your normal speed, when I say "right", turn your head and look to the right. When I say "left" turn your head and look to the left. Try to keep yourself walking in a straight line." (2) Normal: performs head turns with no change in gait speed and good balance. (1) Moderate: performs head turns with reduction in gait speed. X(0) Severe: performs head turns with imbalance. 12. WALK WITH PIVOT TURNS Instruction: "Begin  walking at your normal speed. When I tell you to 'turn and stop', turn as quickly as you can, face the opposite direction, and stop. After the turn, your feet should be close together." X(2) Normal: Turns with feet close FAST (< 3 steps) with good balance. (1) Moderate: Turns with feet close SLOW (>4 steps) with good balance. (0) Severe: Cannot turn with feet close at any speed without imbalance. 13. STEP OVER OBSTACLES Instruction: "Begin walking at your normal speed. When you get to the box, step over it, not around it and keep walking." (2) Normal: Able to step over box with minimal change of gait speed and with good balance. X(1) Moderate: Steps over box but touches box OR displays cautious behavior by slowing gait. (0) Severe: Unable to step over box OR steps around box. 14. TIMED UP & GO WITH DUAL TASK [3 METER WALK] Instruction TUG: "When I say 'Go', stand up from chair, walk at your normal speed across the tape on the floor, turn around, and come back to sit in the chair." Instruction TUG with Dual Task: "Count backwards by threes starting at ___. When I say 'Go', stand up from chair, walk at your normal speed across the tape on the floor, turn around, and come back to sit in the chair. Continue counting backwards the entire time." TUG: ________seconds; Dual Task TUG: ________seconds X(2) Normal: No noticeable change in sitting, standing or walking while backward counting when compared to TUG without Dual Task. (1) Moderate: Dual Task affects either counting OR walking (>10%) when compared to the TUG without Dual Task. (0) Severe: Stops counting while walking OR stops walking while counting. When scoring item 14, if subject's gait speed slows more than 10% between the TUG without and with a Dual Task the score should be decreased by a point. TOTAL SCORE: _____21___/28     Objective measurements completed on examination: See above findings.  PT  Long Term Goals - 07/23/17 1721      PT LONG TERM GOAL #1   Title  Pt will be independent with Parkinson's specific HEP for improved transfers, gait and balance.  TARGET 09/20/17 (pt's treatment sessions begin 08/20/17)    Time  4    Period  Weeks    Status  New    Target Date  09/20/17      PT LONG TERM GOAL #2   Title  Pt will improve 5x sit<>stand to less than or equal to 13 seconds  for improved transfer efficiency and safety.    Time  4    Period  Weeks    Status  New    Target Date  08/20/17      PT LONG TERM GOAL #3   Title  Pt will improve TUG score to less than or equal to 13.5 seconds for decreased fall risk.    Time  4    Period  Weeks    Status  New    Target Date  08/20/17      PT LONG TERM GOAL #4   Title  Pt will improve MiniBESTest score to at least 24/28 for decreased fall risk.    Time  4    Period  Weeks    Status  New    Target Date  08/20/17      PT LONG TERM GOAL #5   Title  Pt will verbalize tips to reduce freezing episodes with gait and turns.    Time  4    Period  Weeks    Status  New    Target Date  08/20/17      Additional Long Term Goals   Additional Long Term Goals  Yes      PT LONG TERM GOAL #6   Title  Pt will verbalize understanding of posture/positioning/body mechanics for improved posture/decreased back pain.    Time  4    Period  Weeks    Status  New    Target Date  08/20/17      PT LONG TERM GOAL #7   Title  Pt will verbalize plans for continued community fitness upon D/C from PT.    Time  4    Period  Weeks    Status  New    Target Date  08/20/17             Plan - 07/23/17 1705    Clinical Impression Statement  Pt is a 72 year old male who presents to OP PT with history of Parkinson's disease, who reports his neurologist has noted changes in posture, balance and gait recently.  He does report freezing episodes that are relatively new, and 3 falls in the past 6 months.  Pt is known to this therapist, and had last bout  of PT in Feb-March 2018, but he did not finish full recommended course of therapy.  Pt presents with decreased balance, abnormal posture, postural instability with slowed postural reactions, bradykinesia, rigidity, decreased timing and coordination of gait, and decreased flexibility, decreased functional lower extremity strength, overall slowed mobility measures in comparison with therapy eval 1 year ago.  Pt participates in exercise classes weekly, and enjoys going to home at nearby [place] area.  He would benefit from skilled PT to address the above stated deficits to decrease fall risk and to improve functional mobility.    History and Personal Factors relevant to plan of care:  PMH includes >3  co-morbidities, >3 systems involved; 3 falls in past 6 months    Clinical Presentation  Evolving    Clinical Presentation due to:  Hx of PD, slowed mobility measures since 1 year ago therapy; fall risk per TUG and MiniBESTest    Clinical Decision Making  Moderate    Rehab Potential  Good    PT Frequency  4x / week    PT Duration  4 weeks plus eval    PT Treatment/Interventions  ADLs/Self Care Home Management;Gait training;Stair training;Functional mobility training;Therapeutic activities;Therapeutic exercise;Balance training;Neuromuscular re-education;Patient/family education    PT Next Visit Plan  Initiate large amplitude, high intensity movement pattern exercises and functional mobility patterns; sit<>stand and tips to reduce freezing with gait    Consulted and Agree with Plan of Care  Patient       Patient will benefit from skilled therapeutic intervention in order to improve the following deficits and impairments:  Abnormal gait, Decreased balance, Decreased mobility, Decreased strength, Difficulty walking, Impaired flexibility, Postural dysfunction, Impaired tone  Visit Diagnosis: Other abnormalities of gait and mobility  Abnormal posture  Unsteadiness on feet  Other symptoms and signs involving  the nervous system  Muscle weakness (generalized)     Problem List Patient Active Problem List   Diagnosis Date Noted  . Medicare annual wellness visit, subsequent 12/04/2016  . Obesity 12/04/2016  . Hyperglycemia 02/04/2015  . Elevated blood pressure reading without diagnosis of hypertension 03/05/2013  . Nonspecific abnormal electrocardiogram (ECG) (EKG) 08/31/2011  . Parkinson's disease ([place]) 06/21/2010  . ANXIETY 07/01/2007  . BPH (benign prostatic hyperplasia) 07/01/2007  . HYPERLIPIDEMIA 04/10/2007  . DISORDER, DYSMETABOLIC SYNDROME X 87/68/1157  . DISORDER, DEPRESSIVE NEC 04/10/2007  . ELEVATED PROSTATE SPECIFIC ANTIGEN 04/10/2007  . History of non anemic vitamin B12 deficiency 10/26/2006  . ERECTILE DYSFUNCTION 10/26/2006  . Crohn's disease of ileum with intestinal obstruction ([place]) 10/26/2006    Hailey Miles W. 07/23/2017, 5:39 PM  Frazier Butt., PT   [facility] [address] [place], [place], [address] Phone: [phone]   Fax:  [phone]  Name: Kyle Ball MRN: [id number] Date of Birth: 1946/02/17

## 2017-07-25 DIAGNOSIS — G4731 Primary central sleep apnea: Secondary | ICD-10-CM | POA: Diagnosis not present

## 2017-07-25 DIAGNOSIS — G4733 Obstructive sleep apnea (adult) (pediatric): Secondary | ICD-10-CM | POA: Diagnosis not present

## 2017-08-03 DIAGNOSIS — N3941 Urge incontinence: Secondary | ICD-10-CM | POA: Diagnosis not present

## 2017-08-20 ENCOUNTER — Encounter: Payer: Self-pay | Admitting: Physical Therapy

## 2017-08-20 ENCOUNTER — Ambulatory Visit: Payer: Medicare Other | Attending: Nurse Practitioner | Admitting: Physical Therapy

## 2017-08-20 DIAGNOSIS — R293 Abnormal posture: Secondary | ICD-10-CM

## 2017-08-20 DIAGNOSIS — R2689 Other abnormalities of gait and mobility: Secondary | ICD-10-CM | POA: Diagnosis not present

## 2017-08-20 DIAGNOSIS — R2681 Unsteadiness on feet: Secondary | ICD-10-CM | POA: Insufficient documentation

## 2017-08-20 NOTE — Therapy (Signed)
[facility] [address] [place], [place], [address] Phone: [phone]   Fax:  [phone]  Physical Therapy Treatment  Patient Details  Name: Kyle Ball MRN: [id number] Date of Birth: 1946/03/23 Referring Provider: Pura Spice   Encounter Date: 08/20/2017  PT End of Session - 08/20/17 1223    Visit Number  2    Number of Visits  17    Date for PT Re-Evaluation  09/21/17    Authorization Type  Medicare and BCBS    PT Start Time  1016    PT Stop Time  1100    PT Time Calculation (min)  44 min    Activity Tolerance  Patient tolerated treatment well    Behavior During Therapy  [facility] for tasks assessed/performed       Past Medical History:  Diagnosis Date  . Anxiety and depression   . BPH (benign prostatic hypertrophy)   . Crohn's disease ([place]) 1979   After resection, 1 flare in 2012.  Marland Kitchen Dysmetabolic syndrome X   . ED (erectile dysfunction)   . Hyperlipidemia   . Internal hemorrhoids   . Parkinson's disease ([place])    [facility]    Past Surgical History:  Procedure Laterality Date  . COLONOSCOPY W/ BIOPSIES   06/2007, 2015   Crohn's, internal hemorrhoids, Dr Carlean Purl  . KNEE ARTHROSCOPY Right 06/2013  . PROSTATE BIOPSY     X 2, Dr Risa Grill  . PTNS Treatment     urinary incontinence  . QUADRICEPS REPAIR Right 1966    knee  . REFRACTIVE SURGERY  2015   Dr Bing Plume  . Terminal Ileum & Appendix Resected  1995   Crohn's    There were no vitals filed for this visit.  Subjective Assessment - 08/20/17 1020    Subjective  Had a "fall" where I was squatting down to fix a lock, but then I toppled backwards.  Got right back up and did not get injured.    Pertinent History  OSA, recent cardiac ultrasounds (awaiting results)    Patient Stated Goals  Pt's goals for therapy are to improve stooped posture, especially with walking    Currently in Pain?  No/denies    Pain Onset  More than a month ago                       [facility] Adult PT Treatment/Exercise - 08/20/17 0001      Ambulation/Gait   Ambulation/Gait  Yes    Ambulation/Gait Assistance  5: Supervision    Ambulation/Gait Assistance Details  Verbal cues for maximal BIG effort with gait, tactile cues for posture and VCs to look ahead for improved posture.    Ambulation Distance (Feet)  575 Feet    Assistive device  None    Gait Pattern  Step-through pattern;Decreased arm swing - right;Decreased arm swing - left;Decreased step length - right;Right flexed knee in stance;Shuffle;Decreased trunk rotation;Trunk flexed;Poor foot clearance - left;Poor foot clearance - right    Ambulation Surface  Level;Indoor         LSVT [facility]) - 08/20/17 1020    Floor to ceiling  10 reps    Side to side  10 reps    Step and Reach Forward  10 reps    Step and Reach Backward  10 reps    Step and Reach Sideways  10 reps    Rock and Reach Forward/Backward  10 reps    [place] and  Reach Sideways  10 reps    1 - Sit to stand  Other reps (comment) 5 reps      Verbal, visual, tactile cues for maximal effort, for posture, amplitude, attention to deliberate movement patterns, increased step length and height.  PT provides tactile cues for maximal reach, for maximal twist in standing exercises.  Tactile cues provided for posture with sit<>stand.  Pt rates intensity of exercises as 6-7/10.   PT Education - 08/20/17 1222    Education provided  Yes    Education Details  Initiated HEP; demonstrated rationale of high intensity, large amplitude movements and expectations with homework    Person(s) Educated  Patient    Methods  Explanation;Demonstration;Handout;Tactile cues;Verbal cues    Comprehension  Verbalized understanding;Returned demonstration;Verbal cues required;Need further instruction          PT Long Term Goals - 07/23/17 1721      PT LONG TERM GOAL #1   Title  Pt will be independent with Parkinson's specific HEP for improved  transfers, gait and balance.  TARGET 09/20/17 (pt's treatment sessions begin 08/20/17)    Time  4    Period  Weeks    Status  New    Target Date  09/20/17      PT LONG TERM GOAL #2   Title  Pt will improve 5x sit<>stand to less than or equal to 13 seconds  for improved transfer efficiency and safety.    Time  4    Period  Weeks    Status  New    Target Date  08/20/17      PT LONG TERM GOAL #3   Title  Pt will improve TUG score to less than or equal to 13.5 seconds for decreased fall risk.    Time  4    Period  Weeks    Status  New    Target Date  08/20/17      PT LONG TERM GOAL #4   Title  Pt will improve MiniBESTest score to at least 24/28 for decreased fall risk.    Time  4    Period  Weeks    Status  New    Target Date  08/20/17      PT LONG TERM GOAL #5   Title  Pt will verbalize tips to reduce freezing episodes with gait and turns.    Time  4    Period  Weeks    Status  New    Target Date  08/20/17      Additional Long Term Goals   Additional Long Term Goals  Yes      PT LONG TERM GOAL #6   Title  Pt will verbalize understanding of posture/positioning/body mechanics for improved posture/decreased back pain.    Time  4    Period  Weeks    Status  New    Target Date  08/20/17      PT LONG TERM GOAL #7   Title  Pt will verbalize plans for continued community fitness upon D/C from PT.    Time  4    Period  Weeks    Status  New    Target Date  08/20/17            Plan - 08/20/17 1223    Clinical Impression Statement  Pt returns today after PT evaluation about one month ago (waiting for consistency/freqeuncy of schedule).  Pt reprots today that part of the schedule limitation is not wanting  to miss [employer] or [employer] Moves exercise classes.  Initiated large amplitude, high intensity movement patterns with seated, standing exercises, sit to stand and gait activities.  Pt needs verbal, visual, tactile cues for maximal BIG effort.  Pt will continue to  benefit from skilled PT to further address posture, balance, and gait.    Rehab Potential  Good    PT Frequency  4x / week    PT Duration  4 weeks plus eval    PT Treatment/Interventions  ADLs/Self Care Home Management;Gait training;Stair training;Functional mobility training;Therapeutic activities;Therapeutic exercise;Balance training;Neuromuscular re-education;Patient/family education    PT Next Visit Plan  Review HEP, continue large amplitude, high intensity movement pattern exercises and functional mobility patterns; sit<>stand and gait     Consulted and Agree with Plan of Care  Patient       Patient will benefit from skilled therapeutic intervention in order to improve the following deficits and impairments:  Abnormal gait, Decreased balance, Decreased mobility, Decreased strength, Difficulty walking, Impaired flexibility, Postural dysfunction, Impaired tone  Visit Diagnosis: Abnormal posture  Unsteadiness on feet  Other abnormalities of gait and mobility     Problem List Patient Active Problem List   Diagnosis Date Noted  . Medicare annual wellness visit, subsequent 12/04/2016  . Obesity 12/04/2016  . Hyperglycemia 02/04/2015  . Elevated blood pressure reading without diagnosis of hypertension 03/05/2013  . Nonspecific abnormal electrocardiogram (ECG) (EKG) 08/31/2011  . Parkinson's disease ([place]) 06/21/2010  . ANXIETY 07/01/2007  . BPH (benign prostatic hyperplasia) 07/01/2007  . HYPERLIPIDEMIA 04/10/2007  . DISORDER, DYSMETABOLIC SYNDROME X 02/63/7858  . DISORDER, DEPRESSIVE NEC 04/10/2007  . ELEVATED PROSTATE SPECIFIC ANTIGEN 04/10/2007  . History of non anemic vitamin B12 deficiency 10/26/2006  . ERECTILE DYSFUNCTION 10/26/2006  . Crohn's disease of ileum with intestinal obstruction ([place]) 10/26/2006    Fable Huisman W. 08/20/2017, 12:28 PM Frazier Butt., PT  [facility] [address] [place],  [place], [address] Phone: [phone]   Fax:  [phone]  Name: Kyle Ball MRN: [id number] Date of Birth: 1945/10/01

## 2017-08-20 NOTE — Patient Instructions (Signed)
Provided handouts for floor>ceiling reach, then side to side reach (seated Handouts for forward step and reach, side step and reach, back step and reach  10 reps, 1-2 times per day Best BIG effort

## 2017-08-21 ENCOUNTER — Ambulatory Visit: Payer: Medicare Other | Admitting: Physical Therapy

## 2017-08-21 ENCOUNTER — Encounter: Payer: Self-pay | Admitting: Physical Therapy

## 2017-08-21 DIAGNOSIS — R2689 Other abnormalities of gait and mobility: Secondary | ICD-10-CM

## 2017-08-21 DIAGNOSIS — R2681 Unsteadiness on feet: Secondary | ICD-10-CM

## 2017-08-21 DIAGNOSIS — R293 Abnormal posture: Secondary | ICD-10-CM | POA: Diagnosis not present

## 2017-08-21 NOTE — Patient Instructions (Signed)
Tips to reduce freezing episodes with standing or walking:  1. Stand tall with your feet wide, so that you can rock and weight shift through your hips. 2. Don't try to fight the freeze: if you begin taking slower, faster, smaller steps, STOP, get your posture tall, and RESET your posture and balance.  Take a deep breath before taking the BIG step to start again. 3. March in place, with high knee stepping, to get started walking again. 4. Use auditory cues:  Count out loud, think of a familiar tune or song or cadence, use pocket metronome, to use rhythm to get started walking again. 5. Use visual cues:  Use a line to step over, use laser pointer line to step over, (using BIG steps) to start walking again. 6. Use visual targets to keep your posture tall (look ahead and focus on an object or target at eye level). 7. As you approach where your destination with walking, count your steps out loud and/or focus on your target with your eyes until you are fully there. 8. Use appropriate assistive device, as advised by your physical therapist to assist with taking longer, consistent steps.  Sit to Stand Transfers:  9. Scoot out to the edge of the chair 10. Place your feet flat on the floor, shoulder width apart.  Make sure your feet are tucked just under your knees. 11. Lean forward (nose over toes) with momentum, and stand up tall with your best posture.  If you need to use your arms, use them as a quick boost up to stand. 12. If you are in a low or soft chair, you can lean back and then forward up to stand, in order to get more momentum. 13. Once you are standing, make sure you are looking ahead and standing tall.  To sit down:  1. Back up until you feel the chair behind your legs. 2. Bend at you hips, reaching  Back for you chair, if needed, then slowly squat to sit down on your chair.

## 2017-08-22 ENCOUNTER — Other Ambulatory Visit (INDEPENDENT_AMBULATORY_CARE_PROVIDER_SITE_OTHER): Payer: Medicare Other

## 2017-08-22 ENCOUNTER — Encounter: Payer: Self-pay | Admitting: Internal Medicine

## 2017-08-22 ENCOUNTER — Ambulatory Visit (INDEPENDENT_AMBULATORY_CARE_PROVIDER_SITE_OTHER): Payer: Medicare Other | Admitting: Internal Medicine

## 2017-08-22 ENCOUNTER — Ambulatory Visit: Payer: Medicare Other | Admitting: Physical Therapy

## 2017-08-22 ENCOUNTER — Encounter: Payer: Self-pay | Admitting: Physical Therapy

## 2017-08-22 VITALS — BP 118/82 | HR 72 | Ht 74.0 in | Wt 249.0 lb

## 2017-08-22 DIAGNOSIS — R2689 Other abnormalities of gait and mobility: Secondary | ICD-10-CM

## 2017-08-22 DIAGNOSIS — R2681 Unsteadiness on feet: Secondary | ICD-10-CM

## 2017-08-22 DIAGNOSIS — R197 Diarrhea, unspecified: Secondary | ICD-10-CM

## 2017-08-22 DIAGNOSIS — Z8639 Personal history of other endocrine, nutritional and metabolic disease: Secondary | ICD-10-CM | POA: Diagnosis not present

## 2017-08-22 DIAGNOSIS — K50012 Crohn's disease of small intestine with intestinal obstruction: Secondary | ICD-10-CM

## 2017-08-22 DIAGNOSIS — R293 Abnormal posture: Secondary | ICD-10-CM | POA: Diagnosis not present

## 2017-08-22 LAB — CBC WITH DIFFERENTIAL/PLATELET
BASOS ABS: 0 10*3/uL (ref 0.0–0.1)
Basophils Relative: 0.6 % (ref 0.0–3.0)
Eosinophils Absolute: 0.1 10*3/uL (ref 0.0–0.7)
Eosinophils Relative: 1.2 % (ref 0.0–5.0)
HCT: 46.6 % (ref 39.0–52.0)
Hemoglobin: 15.2 g/dL (ref 13.0–17.0)
LYMPHS ABS: 1.4 10*3/uL (ref 0.7–4.0)
Lymphocytes Relative: 17.1 % (ref 12.0–46.0)
MCHC: 32.6 g/dL (ref 30.0–36.0)
MCV: 84.8 fl (ref 78.0–100.0)
MONOS PCT: 7.6 % (ref 3.0–12.0)
Monocytes Absolute: 0.6 10*3/uL (ref 0.1–1.0)
NEUTROS ABS: 5.9 10*3/uL (ref 1.4–7.7)
NEUTROS PCT: 73.5 % (ref 43.0–77.0)
PLATELETS: 191 10*3/uL (ref 150.0–400.0)
RBC: 5.49 Mil/uL (ref 4.22–5.81)
RDW: 14.2 % (ref 11.5–15.5)
WBC: 8 10*3/uL (ref 4.0–10.5)

## 2017-08-22 LAB — COMPREHENSIVE METABOLIC PANEL
ALT: 18 U/L (ref 0–53)
AST: 10 U/L (ref 0–37)
Albumin: 4.2 g/dL (ref 3.5–5.2)
Alkaline Phosphatase: 71 U/L (ref 39–117)
BILIRUBIN TOTAL: 0.5 mg/dL (ref 0.2–1.2)
BUN: 18 mg/dL (ref 6–23)
CO2: 31 meq/L (ref 19–32)
Calcium: 9.5 mg/dL (ref 8.4–10.5)
Chloride: 103 mEq/L (ref 96–112)
Creatinine, Ser: 0.75 mg/dL (ref 0.40–1.50)
GFR: 109.54 mL/min (ref >60.00)
GLUCOSE: 104 mg/dL — AB (ref 70–99)
Potassium: 3.9 mEq/L (ref 3.5–5.1)
SODIUM: 139 meq/L (ref 135–145)
Total Protein: 6.7 g/dL (ref 6.0–8.3)

## 2017-08-22 LAB — VITAMIN B12: VITAMIN B 12: 223 pg/mL (ref 211–911)

## 2017-08-22 LAB — C-REACTIVE PROTEIN: CRP: 0.1 mg/dL — AB (ref 0.5–20.0)

## 2017-08-22 NOTE — Therapy (Signed)
[facility] [address] [place], [place], [address] Phone: [phone]   Fax:  [phone]  Physical Therapy Treatment  Patient Details  Name: Kyle Ball MRN: [id number] Date of Birth: 1945/12/01 Referring Provider: Pura Spice   Encounter Date: 08/21/2017  PT End of Session - 08/22/17 1629    Visit Number  3    Number of Visits  17    Date for PT Re-Evaluation  09/21/17    Authorization Type  Medicare and BCBS    PT Start Time  1019    PT Stop Time  1101    PT Time Calculation (min)  42 min    Activity Tolerance  Patient tolerated treatment well    Behavior During Therapy  [facility] for tasks assessed/performed       Past Medical History:  Diagnosis Date  . Anxiety and depression   . BPH (benign prostatic hypertrophy)   . Crohn's disease ([place]) 1979   After resection, 1 flare in 2012.  Marland Kitchen Dysmetabolic syndrome X   . ED (erectile dysfunction)   . Hyperlipidemia   . Internal hemorrhoids   . Parkinson's disease ([place])    [facility]    Past Surgical History:  Procedure Laterality Date  . COLONOSCOPY W/ BIOPSIES   06/2007, 2015   Crohn's, internal hemorrhoids, Dr Carlean Purl  . KNEE ARTHROSCOPY Right 06/2013  . PROSTATE BIOPSY     X 2, Dr Risa Grill  . PTNS Treatment     urinary incontinence  . QUADRICEPS REPAIR Right 1966    knee  . REFRACTIVE SURGERY  2015   Dr Bing Plume  . Terminal Ileum & Appendix Resected  1995   Crohn's    There were no vitals filed for this visit.  Subjective Assessment - 08/22/17 1620    Subjective  No pain, no changes since last visit.  Did the exercises; I'm a little stiff this morning.  Limitations include taking a shower with narrower BOS; turning; balance/trunk rotation, freezing with gait, dual tasking.    Pertinent History  OSA, recent cardiac ultrasounds (awaiting results)    Patient Stated Goals  Pt's goals for therapy are to improve stooped posture, especially with walking     Currently in Pain?  No/denies    Pain Onset  More than a month ago                      [facility] Adult PT Treatment/Exercise - 08/22/17 1621      Transfers   Transfers  Sit to Stand;Stand to Sit    Sit to Stand  6: Modified independent (Device/Increase time);With upper extremity assist;From chair/3-in-1;From bed    Stand to Sit  6: Modified independent (Device/Increase time);With upper extremity assist;To chair/3-in-1;To bed    Number of Reps  10 reps;2 sets from 22" mat, 18" chair    Comments  Sit <>stand followed by lateral weightshift practice to initiate gait      Ambulation/Gait   Ambulation/Gait  Yes    Ambulation/Gait Assistance  5: Supervision    Ambulation Distance (Feet)  460 Feet    Assistive device  None Used bilatearl walking poles to facilitate arm swing    Gait Pattern  Step-through pattern;Decreased arm swing - right;Decreased arm swing - left;Decreased step length - right;Right flexed knee in stance;Shuffle;Decreased trunk rotation;Trunk flexed;Poor foot clearance - left;Poor foot clearance - right    Ambulation Surface  Level;Indoor    Gait Comments  Initial gait with  use of bilateral walking poles, then without walking poles x 230 ft, with continued cues for foot clearance and upright posture. Practiced turns including wide U-turns (as pt would use this at home getting up from his chair and walking to kitchen.  Practiced marching in place, wide BOS turns for tighter space turns-cues for foot clearance      Self-Care   Self-Care  Other Self-Care Comments    Other Self-Care Comments   Discussed tips to reduce freezing; provided handout for techniques for optimal sit<>stand practice.  Discussion regarding POC and LSVT BIG protocol,versus how he scheduled therapy -      Discussed scheduling options and limitations; discussed benefits of LSVT BIG protocol and how to work into therapy schedule; discussed other option of reduced frequency and utilizing more PWR!  (Parkinson Wellness REcovery framework); and pt is more interested in the [place].  Discussed pt's functional mobility limitations-see subjective       PT Education - 08/22/17 1628    Education provided  Yes    Education Details  Tips to reduce freezing episodes, tips for optimal sit<>stand technique; discussed POC and LSVT protocol/scheduling    Person(s) Educated  Patient    Methods  Explanation;Demonstration;Handout    Comprehension  Verbalized understanding;Returned demonstration          PT Long Term Goals - 07/23/17 1721      PT LONG TERM GOAL #1   Title  Pt will be independent with Parkinson's specific HEP for improved transfers, gait and balance.  TARGET 09/20/17 (pt's treatment sessions begin 08/20/17)    Time  4    Period  Weeks    Status  New    Target Date  09/20/17      PT LONG TERM GOAL #2   Title  Pt will improve 5x sit<>stand to less than or equal to 13 seconds  for improved transfer efficiency and safety.    Time  4    Period  Weeks    Status  New    Target Date  08/20/17      PT LONG TERM GOAL #3   Title  Pt will improve TUG score to less than or equal to 13.5 seconds for decreased fall risk.    Time  4    Period  Weeks    Status  New    Target Date  08/20/17      PT LONG TERM GOAL #4   Title  Pt will improve MiniBESTest score to at least 24/28 for decreased fall risk.    Time  4    Period  Weeks    Status  New    Target Date  08/20/17      PT LONG TERM GOAL #5   Title  Pt will verbalize tips to reduce freezing episodes with gait and turns.    Time  4    Period  Weeks    Status  New    Target Date  08/20/17      Additional Long Term Goals   Additional Long Term Goals  Yes      PT LONG TERM GOAL #6   Title  Pt will verbalize understanding of posture/positioning/body mechanics for improved posture/decreased back pain.    Time  4    Period  Weeks    Status  New    Target Date  08/20/17      PT LONG TERM GOAL #7   Title  Pt will  verbalize plans  for continued community fitness upon D/C from PT.    Time  4    Period  Weeks    Status  New    Target Date  08/20/17            Plan - 08/22/17 1629    Clinical Impression Statement  Focused PT session today on functional mobility including tips to reduce freezing with gait/turns and with sit to stand.  Had frank discussion with patient regarding scheduling and LSVT protocol.  Currently, our schedules not set up at this clinic for 1-hr sessions, but PT made request to have this done upon pt's initial scheduling.  Pt ended up scheduling mid-morning appointments for the majority of the time (which are not able to be extended to 1-hr appointments) due to conflicts of his schedule with other exercise activities.  Discussed LSVT BIG protocol, pt's priorities for therapy, and options regarding POC and scheduling.  Pt/PT will reconvene at visit tomorrow to discuss plan.  For remainder of session today, focus on functional mobility activities.    Rehab Potential  Good    PT Frequency  4x / week    PT Duration  4 weeks plus eval    PT Treatment/Interventions  ADLs/Self Care Home Management;Gait training;Stair training;Functional mobility training;Therapeutic activities;Therapeutic exercise;Balance training;Neuromuscular re-education;Patient/family education    PT Next Visit Plan  Decide regarding LSVT protocol and treatment plan; if able, pt to come for 1-hr session tomorrow for trial with large amplitude, high intensity movement patterns again    Consulted and Agree with Plan of Care  Patient       Patient will benefit from skilled therapeutic intervention in order to improve the following deficits and impairments:  Abnormal gait, Decreased balance, Decreased mobility, Decreased strength, Difficulty walking, Impaired flexibility, Postural dysfunction, Impaired tone  Visit Diagnosis: Unsteadiness on feet  Abnormal posture  Other abnormalities of gait and  mobility     Problem List Patient Active Problem List   Diagnosis Date Noted  . Medicare annual wellness visit, subsequent 12/04/2016  . Obesity 12/04/2016  . Hyperglycemia 02/04/2015  . Elevated blood pressure reading without diagnosis of hypertension 03/05/2013  . Nonspecific abnormal electrocardiogram (ECG) (EKG) 08/31/2011  . Parkinson's disease ([place]) 06/21/2010  . ANXIETY 07/01/2007  . BPH (benign prostatic hyperplasia) 07/01/2007  . HYPERLIPIDEMIA 04/10/2007  . DISORDER, DYSMETABOLIC SYNDROME X 73/41/9379  . DISORDER, DEPRESSIVE NEC 04/10/2007  . ELEVATED PROSTATE SPECIFIC ANTIGEN 04/10/2007  . History of non anemic vitamin B12 deficiency 10/26/2006  . ERECTILE DYSFUNCTION 10/26/2006  . Crohn's disease of small intestine with intestinal obstruction ([place]) 10/26/2006    Alexica Schlossberg W. 08/22/2017, 4:35 PM  Frazier Butt., PT   [facility] [address] [place], [place], [address] Phone: [phone]   Fax:  [phone]  Name: AYAN HEFFINGTON MRN: [id number] Date of Birth: 10/25/1945

## 2017-08-22 NOTE — Progress Notes (Signed)
Kyle Ball 72 y.o. Apr 30, 1946 [id number]  Assessment & Plan:   Encounter Diagnoses  Name Primary?  . Crohn's disease of small intestine with intestinal obstruction ([place]) Yes  . Diarrhea, unspecified type   . History of non anemic vitamin B12 deficiency    He has an inflammatory stricture of the ileocolonic anastomosis.  Last imaging was in 2012 otherwise that showed that, on a CT enterography.  I think this stricture is the main cause of his symptoms since he got significant relief after dilation but the dilation results were not maintained because the inflammation crept back in and caused problems again.  I discussed options and we have come up with the following plan: Begin plans to start biologic treatment, quite possibly Cimzia administered in the office.  Laboratory workup with CBC, CMET, C-reactive protein, QuantiFERON testing and hepatitis B screening prior to biologic therapy  I introduced the concept of biologic therapy and reviewed some different types gave a handout from [facility] a and did review the risks of increased infection, increased risk of cancer lymphoma and unpredictable risks.  They have a handout to read.  I think treating him with Cimzia injection in the office may be most cost effective route for him though we can look into other types of treatment through his Medicare part D plan also.  While we are doing this we will schedule him for a colonoscopy to dilate the stricture.  That provided a couple of months or more of relief the last time.  I have held off on cross-sectional imaging but that may be necessary again.  He is tapering by 5 mg weekly on his prednisone and can do that until he tapers off.  He will continue MiraLAX supplementation for the time being as well. I appreciate the opportunity to care for this patient.    Subjective:   Chief Complaint: Constipation and diarrhea and incontinence  HPI Kyle Ball is here with his wife, with complaints of  irregular bowel habits again.  He also has urgent watery defecation with fecal incontinence that is making his quality of life poor.  He has to wear depends.  Traveling is a real problem.  When last seen in the summer 2018 I went ahead and did a dilation of an ileocolonic anastomotic stricture.  He has not followed up since then he got about 2 or 3 months of relief of his symptoms after I dilated that.  More recently he self started some prednisone at 20 mg daily and is tapering down as he has done in the past because he had some abdominal pain and he notes that his bowel habits are a little better.  His Parkinson's is relatively stable.  He relates that he used to take B12 injections and is wondering if he needs to restart those.  A B12 level was normal in 2016 though in the 200 range. Allergies  Allergen Reactions  . Penicillins     Rash age 50 with PCN injection. Because of a history of documented adverse serious drug reaction;Medi Alert bracelet  is recommended   Current Meds  Medication Sig  . aspirin 81 MG tablet Take 81 mg by mouth daily.    Marland Kitchen atorvastatin (LIPITOR) 20 MG tablet Take 1 tablet (20 mg total) daily by mouth. Needs to establish with new PCP for more refills.  . Carbidopa-Levodopa ER (SINEMET CR) 25-100 MG tablet controlled release 3 (three) times daily.   . citalopram (CELEXA) 20 MG tablet Take 30 mg by  mouth daily.   Marland Kitchen dutasteride (AVODART) 0.5 MG capsule Take 0.5 mg by mouth daily.  Marland Kitchen ibuprofen (ADVIL,MOTRIN) 200 MG tablet Take 200 mg by mouth every 6 (six) hours as needed.  . predniSONE (DELTASONE) 5 MG tablet Take by mouth.  . Probiotic Product (PROBIOTIC PO) Take 1 tablet by mouth daily.  . Tamsulosin HCl (FLOMAX) 0.4 MG CAPS Take 0.4 mg by mouth daily.     Past Medical History:  Diagnosis Date  . Anxiety and depression   . BPH (benign prostatic hypertrophy)   . Crohn's disease ([place]) 1979   After resection, 1 flare in 2012.  Marland Kitchen Dysmetabolic syndrome X   . ED  (erectile dysfunction)   . Hyperlipidemia   . Internal hemorrhoids   . Parkinson's disease ([place])    [facility]   Past Surgical History:  Procedure Laterality Date  . COLONOSCOPY W/ BIOPSIES   06/2007, 2015   Crohn's, internal hemorrhoids, Dr Carlean Purl  . KNEE ARTHROSCOPY Right 06/2013  . PROSTATE BIOPSY     X 2, Dr Risa Grill  . PTNS Treatment     urinary incontinence  . QUADRICEPS REPAIR Right 1966    knee  . REFRACTIVE SURGERY  2015   Dr Bing Plume  . Terminal Ileum & Appendix Resected  1995   Crohn's   Social History   Social History Narrative   Fun/Hobby: Working around the house and fixing things    family history includes Diabetes in his father; Fibromyalgia in his sister; Heart attack (age of onset: 38) in his father; Hypertension in his father; Mental illness in his sister; Stroke (age of onset: 38) in his father; Testicular cancer (age of onset: 52) in his brother; Uterine cancer in his mother.   Review of Systems As per HPI  Objective:   Physical Exam BP 118/82   Pulse 72   Ht 6\' 2"  (1.88 m)   Wt 249 lb (112.9 kg)   BMI 31.97 kg/m  NAD Masked facies and bradykinetic - Parkinson's Lungs CTA Cor S1S2 abd obese soft and NT w/ moderate upper abdominal incisional hernia No cyanosis clubbing noted in the extremities there is trace ankle edema bilateral

## 2017-08-22 NOTE — Patient Instructions (Addendum)
You have been scheduled for a colonoscopy. Please follow written instructions given to you at your visit today.  Please pick up your prep supplies at the pharmacy. If you use inhalers (even only as needed), please bring them with you on the day of your procedure.   Your physician has requested that you go to the basement for the lab work before leaving today.  It is okay to continue the Miralax per Dr Carlean Purl.   We are giving you information on biologics to read.   I appreciate the opportunity to care for you. Silvano Rusk, MD, [facility]

## 2017-08-23 ENCOUNTER — Ambulatory Visit: Payer: Medicare Other | Admitting: Physical Therapy

## 2017-08-23 NOTE — Progress Notes (Signed)
B12 level is low normal  He should get B12 injection 1000 ug monthly  Can we arrange that?

## 2017-08-23 NOTE — Therapy (Signed)
[facility] [address] [place], [place], [address] Phone: [phone]   Fax:  [phone]  Physical Therapy Treatment  Patient Details  Name: Kyle Ball MRN: [id number] Date of Birth: 10/17/1945 Referring Provider: Pura Spice   Encounter Date: 08/22/2017  PT End of Session - 08/23/17 1514    Visit Number  4    Number of Visits  17    Date for PT Re-Evaluation  09/21/17    Authorization Type  Medicare and BCBS    PT Start Time  1002    PT Stop Time  1102    PT Time Calculation (min)  60 min    Activity Tolerance  Patient tolerated treatment well    Behavior During Therapy  [facility] for tasks assessed/performed       Past Medical History:  Diagnosis Date  . Anxiety and depression   . BPH (benign prostatic hypertrophy)   . Crohn's disease ([place]) 1979   After resection, 1 flare in 2012.  Marland Kitchen Dysmetabolic syndrome X   . ED (erectile dysfunction)   . Hyperlipidemia   . Internal hemorrhoids   . Parkinson's disease ([place])    [facility]    Past Surgical History:  Procedure Laterality Date  . COLONOSCOPY W/ BIOPSIES   06/2007, 2015   Crohn's, internal hemorrhoids, Dr Carlean Purl  . KNEE ARTHROSCOPY Right 06/2013  . PROSTATE BIOPSY     X 2, Dr Risa Grill  . PTNS Treatment     urinary incontinence  . QUADRICEPS REPAIR Right 1966    knee  . REFRACTIVE SURGERY  2015   Dr Bing Plume  . Terminal Ileum & Appendix Resected  1995   Crohn's    There were no vitals filed for this visit.  Subjective Assessment - 08/23/17 1303    Subjective  No changes since last visit.    Pertinent History  OSA, recent cardiac ultrasounds (awaiting results)    Patient Stated Goals  Pt's goals for therapy are to improve stooped posture, especially with walking    Currently in Pain?  Yes    Pain Score  4     Pain Location  -- Back and legs    Pain Orientation  Right;Left;Lower    Pain Descriptors / Indicators  Aching    Pain Type   Chronic pain    Pain Onset  More than a month ago    Pain Frequency  Intermittent    Aggravating Factors   moving in a way that I shouldn't    Pain Relieving Factors  stretching       Neuro Re-education     LSVT [facility]) - Exercises   Floor to ceiling  10 reps    Side to side  10 reps    Step and Reach Forward  10 reps no UE support   Step and Reach Backward  10 reps no UE support   Step and Reach Sideways  10 reps no UE support   Rock and Reach Forward/Backward  10 reps, initial rocking lower extremities only, then progress to full 10 reps rock and reach with UE reaching   Rock and Reach Sideways  10 reps, cues for weightshifting, rocking with twist, cues for BIG reach both UEs   1 - Sit to stand  Other reps:  10 reps from mat surface, with cues for increased forward lean; then 10 reps from 18" chair surface, cues for positioning and increased forward lean, upright posture to stand.  Pt needs visual, verbal, and tactile cues for maximal BIG effort to achieve large amplitude and high intensity with activities.  Gait activities as noted below-            [facility] Adult PT Treatment/Exercise - 08/23/17 0001      Ambulation/Gait   Ambulation/Gait  Yes    Ambulation/Gait Assistance  5: Supervision    Ambulation/Gait Assistance Details  Cues for best BIG posture, arm swing, foot clearance    Ambulation Distance (Feet)  460 Feet    Assistive device  None    Gait Pattern  Step-through pattern;Decreased arm swing - right;Decreased arm swing - left;Decreased step length - right;Right flexed knee in stance;Shuffle;Decreased trunk rotation;Trunk flexed;Poor foot clearance - left;Poor foot clearance - right    Ambulation Surface  Level;Indoor    Gait Comments  Reviewed/practiced gait with U-turns, gait with marching and wide BOS weightshifting turns using cones as markers for turning; cues for increased foot clearance with turns.      Posture/Postural Control    Posture/Postural Control  Postural limitations    Postural Limitations  Rounded Shoulders;Forward head    Posture Comments  Standing at doorframe, with upright posture, with attempts at cervical retraction;  then supine neck retraction, 2 sets x 10 reps with tactile cues for posture; supine scapular retraction x 10 reps with tactile cues             PT Education - 08/23/17 1511    Education provided  Yes    Education Details  Discussion regarding POC and plan for LSVT BIG protocol (due to scheduling issues, will need to push POC farther out; could see him for 2x/wk for working on tips to reduce freezing episode, fall prevention then return to LSVT BIG 4x/wk frequency)    Person(s) Educated  Patient    Methods  Explanation    Comprehension  Verbalized understanding          PT Long Term Goals - 07/23/17 1721      PT LONG TERM GOAL #1   Title  Pt will be independent with Parkinson's specific HEP for improved transfers, gait and balance.  TARGET 09/20/17 (pt's treatment sessions begin 08/20/17)    Time  4    Period  Weeks    Status  New    Target Date  09/20/17      PT LONG TERM GOAL #2   Title  Pt will improve 5x sit<>stand to less than or equal to 13 seconds  for improved transfer efficiency and safety.    Time  4    Period  Weeks    Status  New    Target Date  08/20/17      PT LONG TERM GOAL #3   Title  Pt will improve TUG score to less than or equal to 13.5 seconds for decreased fall risk.    Time  4    Period  Weeks    Status  New    Target Date  08/20/17      PT LONG TERM GOAL #4   Title  Pt will improve MiniBESTest score to at least 24/28 for decreased fall risk.    Time  4    Period  Weeks    Status  New    Target Date  08/20/17      PT LONG TERM GOAL #5   Title  Pt will verbalize tips to reduce freezing episodes with gait and turns.    Time  4    Period  Weeks    Status  New    Target Date  08/20/17      Additional Long Term Goals   Additional Long  Term Goals  Yes      PT LONG TERM GOAL #6   Title  Pt will verbalize understanding of posture/positioning/body mechanics for improved posture/decreased back pain.    Time  4    Period  Weeks    Status  New    Target Date  08/20/17      PT LONG TERM GOAL #7   Title  Pt will verbalize plans for continued community fitness upon D/C from PT.    Time  4    Period  Weeks    Status  New    Target Date  08/20/17            Plan - 08/23/17 1530    Clinical Impression Statement  Today's PT session performed:  stimulability testing/LSVT BIG trial of sitting and standing exercises, sit to stand and gait activities.  Pt does respond well to cues, but again had discussion regarding priorities with therapy and his ability to schedule at frequency/time duration of sessions needed for LSVT BIG protocol.  He agrees to this and agreeable to modifying his POC to reflect several weeks to work on transfers, initiation of gait, tips to reduce freezing of gait, posture, and fall prevention, then transition (as schedule allows) to 4x/wk for 4 weeks of LSVT BIG training.  Will complete renewal/recert to reflect this when LSVT BIG protocol starts.    Rehab Potential  Good    PT Frequency  4x / week per discussion 08/22/17 session:  2x/wk for 2 more weeks (then will need recert for 4x/wk for 4 weeks)    PT Duration  4 weeks plus eval    PT Treatment/Interventions  ADLs/Self Care Home Management;Gait training;Stair training;Functional mobility training;Therapeutic activities;Therapeutic exercise;Balance training;Neuromuscular re-education;Patient/family education    PT Next Visit Plan  Review sit<>stand, tips to reduce freezing with gait, posture exercises, and fall prevention    Consulted and Agree with Plan of Care  Patient       Patient will benefit from skilled therapeutic intervention in order to improve the following deficits and impairments:  Abnormal gait, Decreased balance, Decreased mobility, Decreased  strength, Difficulty walking, Impaired flexibility, Postural dysfunction, Impaired tone  Visit Diagnosis: Unsteadiness on feet  Abnormal posture  Other abnormalities of gait and mobility     Problem List Patient Active Problem List   Diagnosis Date Noted  . Medicare annual wellness visit, subsequent 12/04/2016  . Obesity 12/04/2016  . Hyperglycemia 02/04/2015  . Elevated blood pressure reading without diagnosis of hypertension 03/05/2013  . Nonspecific abnormal electrocardiogram (ECG) (EKG) 08/31/2011  . Parkinson's disease ([place]) 06/21/2010  . ANXIETY 07/01/2007  . BPH (benign prostatic hyperplasia) 07/01/2007  . HYPERLIPIDEMIA 04/10/2007  . DISORDER, DYSMETABOLIC SYNDROME X 62/26/3335  . DISORDER, DEPRESSIVE NEC 04/10/2007  . ELEVATED PROSTATE SPECIFIC ANTIGEN 04/10/2007  . History of non anemic vitamin B12 deficiency 10/26/2006  . ERECTILE DYSFUNCTION 10/26/2006  . Crohn's disease of small intestine with intestinal obstruction ([place]) 10/26/2006    Jorell Agne W. 08/23/2017, 3:38 PM Frazier Butt., PT  [facility] [address] [place], [place], [address] Phone: [phone]   Fax:  [phone]  Name: Kyle Ball MRN: [id number] Date of Birth: 1945-09-08

## 2017-08-24 LAB — QUANTIFERON-TB GOLD PLUS
NIL: 0.07 [IU]/mL
QuantiFERON-TB Gold Plus: NEGATIVE
TB1-NIL: <0 IU/mL

## 2017-08-24 LAB — HEPATITIS B SURFACE ANTIGEN: Hepatitis B Surface Ag: NONREACTIVE

## 2017-08-24 LAB — HEPATITIS B CORE ANTIBODY, TOTAL: HEP B C TOTAL AB: NONREACTIVE

## 2017-08-24 LAB — HEPATITIS B SURFACE ANTIBODY,QUALITATIVE: Hep B S Ab: NONREACTIVE

## 2017-08-28 ENCOUNTER — Encounter: Payer: Self-pay | Admitting: Physical Therapy

## 2017-08-28 ENCOUNTER — Ambulatory Visit: Payer: Medicare Other | Admitting: Physical Therapy

## 2017-08-28 DIAGNOSIS — R2689 Other abnormalities of gait and mobility: Secondary | ICD-10-CM

## 2017-08-28 DIAGNOSIS — R293 Abnormal posture: Secondary | ICD-10-CM | POA: Diagnosis not present

## 2017-08-28 DIAGNOSIS — R2681 Unsteadiness on feet: Secondary | ICD-10-CM | POA: Diagnosis not present

## 2017-08-28 NOTE — Progress Notes (Signed)
Having colonoscopy 09/2016 and I am anticipating biologic Tx I am thinking he would be a good candidate to get Cimzia injections in the offoce Is that possible for him or will his insurance recommend another biologoic

## 2017-08-28 NOTE — Progress Notes (Signed)
As discussed no Rx now Will wait til his colonoscopy is done to decide

## 2017-08-28 NOTE — Therapy (Signed)
[facility] [address] [place], [place], [address] Phone: [phone]   Fax:  [phone]  Physical Therapy Treatment  Patient Details  Name: Kyle Ball MRN: [id number] Date of Birth: Oct 23, 1945 Referring Provider: Pura Spice   Encounter Date: 08/28/2017  PT End of Session - 08/28/17 2020    Visit Number  5    Number of Visits  17    Date for PT Re-Evaluation  09/21/17    Authorization Type  Medicare and BCBS    PT Start Time  1021    PT Stop Time  1101    PT Time Calculation (min)  40 min    Activity Tolerance  Patient tolerated treatment well    Behavior During Therapy  [facility] for tasks assessed/performed       Past Medical History:  Diagnosis Date  . Anxiety and depression   . BPH (benign prostatic hypertrophy)   . Crohn's disease ([place]) 1979   After resection, 1 flare in 2012.  Marland Kitchen Dysmetabolic syndrome X   . ED (erectile dysfunction)   . Hyperlipidemia   . Internal hemorrhoids   . Parkinson's disease ([place])    [facility]    Past Surgical History:  Procedure Laterality Date  . COLONOSCOPY W/ BIOPSIES   06/2007, 2015   Crohn's, internal hemorrhoids, Dr Carlean Purl  . KNEE ARTHROSCOPY Right 06/2013  . PROSTATE BIOPSY     X 2, Dr Risa Grill  . PTNS Treatment     urinary incontinence  . QUADRICEPS REPAIR Right 1966    knee  . REFRACTIVE SURGERY  2015   Dr Bing Plume  . Terminal Ileum & Appendix Resected  1995   Crohn's    There were no vitals filed for this visit.  Subjective Assessment - 08/28/17 1024    Subjective  Worked on bagging leaves this weekend, and I'm sore from doing that.    Pertinent History  OSA, recent cardiac ultrasounds (awaiting results)    Patient Stated Goals  Pt's goals for therapy are to improve stooped posture, especially with walking    Currently in Pain?  Yes    Pain Score  6     Pain Location  Back    Pain Orientation  Right;Left;Lower    Pain Descriptors / Indicators   Aching    Pain Type  Chronic pain    Pain Onset  More than a month ago    Pain Frequency  Intermittent    Aggravating Factors   moving in a way I shouldn't    Pain Relieving Factors  stretching                      [facility] Adult PT Treatment/Exercise - 08/28/17 0001      Transfers   Transfers  Sit to Stand;Stand to Sit    Sit to Stand  6: Modified independent (Device/Increase time);With upper extremity assist;From chair/3-in-1;From bed    Stand to Sit  6: Modified independent (Device/Increase time);With upper extremity assist;To chair/3-in-1;To bed    Number of Reps  10 reps      Posture/Postural Control   Posture Comments  At end of session, in standing, PT provides cues for pt to "be as tall as the wall" and to "press towel into wall" as cue for upright neck and shoulder posture.  Ended session with short distance gait with cues for upright posture and increased step length.  Standing postural exercises:  in standing with UE support  at counter simulating washing dishes tasks:  wide BOS with lateral weightshifting, then stagger stance forward/back weightshifting, then practiced optimal technique for sidestep squatting, simulating loading dishwasher      Exercises   Exercises  Lumbar;Neck      Neck Exercises: Seated   Neck Retraction  5 reps    Neck Retraction Limitations  Seated at wall, with pillow>towel behind head as tactile cue for sitting with upright posture      Neck Exercises: Supine   Neck Retraction  5 reps;3 secs 2 sets    Neck Retraction Limitations  Additional manual facilitation and stretching for stretch into increased neck retraction; cues for chin tuck    Other Supine Exercise  scapular retraction/shoulder press into mat 2 sets x 10 reps with manual cues      Lumbar Exercises: Stretches   Single Knee to Chest Stretch  Right;Left;3 reps;30 seconds    Lower Trunk Rotation  3 reps;30 seconds    Lower Trunk Rotation Limitations  started with gentle trunk  rotation rocking side to side    Pelvic Tilt  10 reps 2 sets, with facilitation/tactile cues             PT Education - 08/28/17 2020    Education provided  Yes    Education Details  postural awareness in sitting, standing, activities at home    Person(s) Educated  Patient    Methods  Explanation;Demonstration    Comprehension  Verbalized understanding;Returned demonstration;Verbal cues required          PT Long Term Goals - 07/23/17 1721      PT LONG TERM GOAL #1   Title  Pt will be independent with Parkinson's specific HEP for improved transfers, gait and balance.  TARGET 09/20/17 (pt's treatment sessions begin 08/20/17)    Time  4    Period  Weeks    Status  New    Target Date  09/20/17      PT LONG TERM GOAL #2   Title  Pt will improve 5x sit<>stand to less than or equal to 13 seconds  for improved transfer efficiency and safety.    Time  4    Period  Weeks    Status  New    Target Date  08/20/17      PT LONG TERM GOAL #3   Title  Pt will improve TUG score to less than or equal to 13.5 seconds for decreased fall risk.    Time  4    Period  Weeks    Status  New    Target Date  08/20/17      PT LONG TERM GOAL #4   Title  Pt will improve MiniBESTest score to at least 24/28 for decreased fall risk.    Time  4    Period  Weeks    Status  New    Target Date  08/20/17      PT LONG TERM GOAL #5   Title  Pt will verbalize tips to reduce freezing episodes with gait and turns.    Time  4    Period  Weeks    Status  New    Target Date  08/20/17      Additional Long Term Goals   Additional Long Term Goals  Yes      PT LONG TERM GOAL #6   Title  Pt will verbalize understanding of posture/positioning/body mechanics for improved posture/decreased back pain.    Time  4  Period  Weeks    Status  New    Target Date  08/20/17      PT LONG TERM GOAL #7   Title  Pt will verbalize plans for continued community fitness upon D/C from PT.    Time  4    Period  Weeks     Status  New    Target Date  08/20/17            Plan - 08/28/17 2021    Clinical Impression Statement  Addressed postural exercises this visit, in supine, sitting, and standing, and focused on short distance gait as well as standing activities with optimal weightshifting and body mechanics to avoid forward posture and back pain.  By session's end, pt's neck and shoulder posture appears better, but needs occsional reminder cues to keep optimal posture.    Rehab Potential  Good    PT Frequency  4x / week per discussion 08/22/17 session:  2x/wk for 2 more weeks (then will need recert for 4x/wk for 4 weeks)    PT Duration  4 weeks plus eval    PT Treatment/Interventions  ADLs/Self Care Home Management;Gait training;Stair training;Functional mobility training;Therapeutic activities;Therapeutic exercise;Balance training;Neuromuscular re-education;Patient/family education    PT Next Visit Plan  Review sit<>stand, tips to reduce freezing with gait, posture exercises, and fall prevention    Consulted and Agree with Plan of Care  Patient       Patient will benefit from skilled therapeutic intervention in order to improve the following deficits and impairments:  Abnormal gait, Decreased balance, Decreased mobility, Decreased strength, Difficulty walking, Impaired flexibility, Postural dysfunction, Impaired tone  Visit Diagnosis: Other abnormalities of gait and mobility     Problem List Patient Active Problem List   Diagnosis Date Noted  . Medicare annual wellness visit, subsequent 12/04/2016  . Obesity 12/04/2016  . Hyperglycemia 02/04/2015  . Elevated blood pressure reading without diagnosis of hypertension 03/05/2013  . Nonspecific abnormal electrocardiogram (ECG) (EKG) 08/31/2011  . Parkinson's disease ([place]) 06/21/2010  . ANXIETY 07/01/2007  . BPH (benign prostatic hyperplasia) 07/01/2007  . HYPERLIPIDEMIA 04/10/2007  . DISORDER, DYSMETABOLIC SYNDROME X 19/62/2297  . DISORDER,  DEPRESSIVE NEC 04/10/2007  . ELEVATED PROSTATE SPECIFIC ANTIGEN 04/10/2007  . History of non anemic vitamin B12 deficiency 10/26/2006  . ERECTILE DYSFUNCTION 10/26/2006  . Crohn's disease of small intestine with intestinal obstruction ([place]) 10/26/2006    Nyela Cortinas W. 08/28/2017, 8:24 PM  Frazier Butt., PT   [facility] [address] [place], [place], [address] Phone: [phone]   Fax:  [phone]  Name: ATILANO COVELLI MRN: [id number] Date of Birth: 08-Jul-1945

## 2017-08-29 ENCOUNTER — Ambulatory Visit: Payer: Medicare Other | Admitting: Physical Therapy

## 2017-08-30 ENCOUNTER — Ambulatory Visit: Payer: Medicare Other | Admitting: Physical Therapy

## 2017-08-30 DIAGNOSIS — N3941 Urge incontinence: Secondary | ICD-10-CM | POA: Diagnosis not present

## 2017-08-31 ENCOUNTER — Ambulatory Visit: Payer: Medicare Other | Admitting: Physical Therapy

## 2017-09-03 ENCOUNTER — Encounter: Payer: Self-pay | Admitting: Physical Therapy

## 2017-09-03 ENCOUNTER — Ambulatory Visit: Payer: Medicare Other | Admitting: Physical Therapy

## 2017-09-03 DIAGNOSIS — R2681 Unsteadiness on feet: Secondary | ICD-10-CM | POA: Diagnosis not present

## 2017-09-03 DIAGNOSIS — R2689 Other abnormalities of gait and mobility: Secondary | ICD-10-CM | POA: Diagnosis not present

## 2017-09-03 DIAGNOSIS — R293 Abnormal posture: Secondary | ICD-10-CM

## 2017-09-03 NOTE — Therapy (Signed)
[facility] [address] [place], [place], [address] Phone: [phone]   Fax:  [phone]  Physical Therapy Treatment  Patient Details  Name: Kyle Ball MRN: [id number] Date of Birth: Mar 29, 1946 Referring Provider: Pura Spice   Encounter Date: 09/03/2017  PT End of Session - 09/03/17 1148    Visit Number  6    Number of Visits  17    Date for PT Re-Evaluation  09/21/17    Authorization Type  Medicare and BCBS    PT Start Time  1029 Pt arrived late    PT Stop Time  1101    PT Time Calculation (min)  32 min    Activity Tolerance  Patient tolerated treatment well    Behavior During Therapy  [facility] for tasks assessed/performed       Past Medical History:  Diagnosis Date  . Anxiety and depression   . BPH (benign prostatic hypertrophy)   . Crohn's disease ([place]) 1979   After resection, 1 flare in 2012.  Marland Kitchen Dysmetabolic syndrome X   . ED (erectile dysfunction)   . Hyperlipidemia   . Internal hemorrhoids   . Parkinson's disease ([place])    [facility]    Past Surgical History:  Procedure Laterality Date  . COLONOSCOPY W/ BIOPSIES   06/2007, 2015   Crohn's, internal hemorrhoids, Dr Carlean Purl  . KNEE ARTHROSCOPY Right 06/2013  . PROSTATE BIOPSY     X 2, Dr Risa Grill  . PTNS Treatment     urinary incontinence  . QUADRICEPS REPAIR Right 1966    knee  . REFRACTIVE SURGERY  2015   Dr Bing Plume  . Terminal Ileum & Appendix Resected  1995   Crohn's    There were no vitals filed for this visit.  Subjective Assessment - 09/03/17 1031    Subjective  I have a tendency to do too much on the weekend, so my back is sore and achy today.    Pertinent History  OSA, recent cardiac ultrasounds (awaiting results)    Patient Stated Goals  Pt's goals for therapy are to improve stooped posture, especially with walking    Currently in Pain?  Yes    Pain Score  5     Pain Location  Back    Pain Orientation  Right;Left;Lower    Pain Descriptors / Indicators  Aching    Pain Type  Chronic pain    Pain Onset  More than a month ago    Pain Frequency  Intermittent    Aggravating Factors   doing too much with weekend yard activities    Pain Relieving Factors  sitting down, stretching                No data recorded       [facility] Adult PT Treatment/Exercise - 09/03/17 1033      Exercises   Exercises  Lumbar;Neck      Neck Exercises: Supine   Neck Retraction  5 reps;3 secs 2 sets    Neck Retraction Limitations  Additional manual facilitation and stretching for stretch into increased neck retraction; cues for chin tuck    Other Supine Exercise  scapular retraction/shoulder press into mat 2 sets x 10 reps with manual cues      Lumbar Exercises: Stretches   Active Hamstring Stretch  Right;3 reps;20 seconds    Active Hamstring Stretch Limitations  Cues for posture, technique seated at edge of mat, foot propped on ground  Lumbar Exercises: Seated   Other Seated Lumbar Exercises  Seated pelvic tilt x 10 reps with initial facilitation      Lumbar Exercises: Supine   Pelvic Tilt  10 reps    Pelvic Tilt Limitations  with initial facilitation        PWR [facility]) - 09/03/17 1045    PWR! exercises  Moves in sitting;Moves in standing;Functional moves    PWR! Up  x 10 reps, then x 10 reps in modified quadruped position standing at counter    PWR! Sit to Stand  with PWR! Up upon standing, x 10 reps    PWR! Up  x 10 reps      With PWR! Moves, pt given posture cues, cues for increased intensity, amplitude and activation for improved deliberate movement patterns.  In standing, tactile and verbal cues provided for terminal knee extension.         PT Long Term Goals - 07/23/17 1721      PT LONG TERM GOAL #1   Title  Pt will be independent with Parkinson's specific HEP for improved transfers, gait and balance.  TARGET 09/20/17 (pt's treatment sessions begin 08/20/17)    Time  4    Period  Weeks     Status  New    Target Date  09/20/17      PT LONG TERM GOAL #2   Title  Pt will improve 5x sit<>stand to less than or equal to 13 seconds  for improved transfer efficiency and safety.    Time  4    Period  Weeks    Status  New    Target Date  08/20/17      PT LONG TERM GOAL #3   Title  Pt will improve TUG score to less than or equal to 13.5 seconds for decreased fall risk.    Time  4    Period  Weeks    Status  New    Target Date  08/20/17      PT LONG TERM GOAL #4   Title  Pt will improve MiniBESTest score to at least 24/28 for decreased fall risk.    Time  4    Period  Weeks    Status  New    Target Date  08/20/17      PT LONG TERM GOAL #5   Title  Pt will verbalize tips to reduce freezing episodes with gait and turns.    Time  4    Period  Weeks    Status  New    Target Date  08/20/17      Additional Long Term Goals   Additional Long Term Goals  Yes      PT LONG TERM GOAL #6   Title  Pt will verbalize understanding of posture/positioning/body mechanics for improved posture/decreased back pain.    Time  4    Period  Weeks    Status  New    Target Date  08/20/17      PT LONG TERM GOAL #7   Title  Pt will verbalize plans for continued community fitness upon D/C from PT.    Time  4    Period  Weeks    Status  New    Target Date  08/20/17            Plan - 09/03/17 1148    Clinical Impression Statement  Address postural exercises in supine, then PWR! Moves postural exercises for more dynamic posture exercises  in various sitting and standing positions.  Pt responds well to cues, but also noted to have difficulty sustaining posture once cues removed.    Rehab Potential  Good    PT Frequency  4x / week per discussion 08/22/17 session:  2x/wk for 2 more weeks (then will need recert for 4x/wk for 4 weeks)    PT Duration  4 weeks plus eval    PT Treatment/Interventions  ADLs/Self Care Home Management;Gait training;Stair training;Functional mobility  training;Therapeutic activities;Therapeutic exercise;Balance training;Neuromuscular re-education;Patient/family education    PT Next Visit Plan  Review sit<>stand, tips to reduce freezing with gait, posture exercises, and fall prevention    Consulted and Agree with Plan of Care  Patient       Patient will benefit from skilled therapeutic intervention in order to improve the following deficits and impairments:  Abnormal gait, Decreased balance, Decreased mobility, Decreased strength, Difficulty walking, Impaired flexibility, Postural dysfunction, Impaired tone  Visit Diagnosis: Abnormal posture     Problem List Patient Active Problem List   Diagnosis Date Noted  . Medicare annual wellness visit, subsequent 12/04/2016  . Obesity 12/04/2016  . Hyperglycemia 02/04/2015  . Elevated blood pressure reading without diagnosis of hypertension 03/05/2013  . Nonspecific abnormal electrocardiogram (ECG) (EKG) 08/31/2011  . Parkinson's disease ([place]) 06/21/2010  . ANXIETY 07/01/2007  . BPH (benign prostatic hyperplasia) 07/01/2007  . HYPERLIPIDEMIA 04/10/2007  . DISORDER, DYSMETABOLIC SYNDROME X 20/72/1828  . DISORDER, DEPRESSIVE NEC 04/10/2007  . ELEVATED PROSTATE SPECIFIC ANTIGEN 04/10/2007  . History of non anemic vitamin B12 deficiency 10/26/2006  . ERECTILE DYSFUNCTION 10/26/2006  . Crohn's disease of small intestine with intestinal obstruction ([place]) 10/26/2006    MARRIOTT,AMY W. 09/03/2017, 11:50 AM  Frazier Butt., PT   [facility] [address] [place], [place], [address] Phone: [phone]   Fax:  [phone]  Name: Kyle Ball MRN: [id number] Date of Birth: 1946/04/20

## 2017-09-04 ENCOUNTER — Ambulatory Visit: Payer: Medicare Other | Admitting: Physical Therapy

## 2017-09-04 ENCOUNTER — Encounter: Payer: Self-pay | Admitting: Physical Therapy

## 2017-09-04 DIAGNOSIS — R2689 Other abnormalities of gait and mobility: Secondary | ICD-10-CM | POA: Diagnosis not present

## 2017-09-04 DIAGNOSIS — R2681 Unsteadiness on feet: Secondary | ICD-10-CM | POA: Diagnosis not present

## 2017-09-04 DIAGNOSIS — R293 Abnormal posture: Secondary | ICD-10-CM

## 2017-09-04 NOTE — Therapy (Signed)
[facility] [address] [place], [place], [address] Phone: [phone]   Fax:  [phone]  Physical Therapy Treatment  Patient Details  Name: Kyle Ball MRN: [id number] Date of Birth: 12/20/1945 Referring Provider: Pura Spice   Encounter Date: 09/04/2017  PT End of Session - 09/04/17 2159    Visit Number  7    Number of Visits  17    Date for PT Re-Evaluation  09/21/17    Authorization Type  Medicare and BCBS    PT Start Time  1016    PT Stop Time  1059    PT Time Calculation (min)  43 min    Activity Tolerance  Patient tolerated treatment well    Behavior During Therapy  [facility] for tasks assessed/performed       Past Medical History:  Diagnosis Date  . Anxiety and depression   . BPH (benign prostatic hypertrophy)   . Crohn's disease ([place]) 1979   After resection, 1 flare in 2012.  Marland Kitchen Dysmetabolic syndrome X   . ED (erectile dysfunction)   . Hyperlipidemia   . Internal hemorrhoids   . Parkinson's disease ([place])    [facility]    Past Surgical History:  Procedure Laterality Date  . COLONOSCOPY W/ BIOPSIES   06/2007, 2015   Crohn's, internal hemorrhoids, Dr Carlean Purl  . KNEE ARTHROSCOPY Right 06/2013  . PROSTATE BIOPSY     X 2, Dr Risa Grill  . PTNS Treatment     urinary incontinence  . QUADRICEPS REPAIR Right 1966    knee  . REFRACTIVE SURGERY  2015   Dr Bing Plume  . Terminal Ileum & Appendix Resected  1995   Crohn's    There were no vitals filed for this visit.  Subjective Assessment - 09/04/17 1019    Subjective  I have such a hard time with my neck keeping it up-my wife took pictures of me standing at the sink and my posture is not good.    Pertinent History  OSA, recent cardiac ultrasounds (awaiting results)    Patient Stated Goals  Pt's goals for therapy are to improve stooped posture, especially with walking    Currently in Pain?  Yes    Pain Score  5     Pain Location  Back    Pain  Orientation  Right;Left;Lower    Pain Descriptors / Indicators  Aching    Pain Onset  More than a month ago    Aggravating Factors   standing too long at sink with bad posture    Pain Relieving Factors  sitting down, stretching                No data recorded       [facility] Adult PT Treatment/Exercise - 09/04/17 0001      Transfers   Transfers  Sit to Stand;Stand to Sit    Sit to Stand  6: Modified independent (Device/Increase time);With upper extremity assist;From chair/3-in-1;From bed    Stand to Sit  6: Modified independent (Device/Increase time);With upper extremity assist;To chair/3-in-1;To bed    Number of Reps  10 reps      Ambulation/Gait   Ambulation/Gait  Yes    Ambulation/Gait Assistance  5: Supervision    Ambulation/Gait Assistance Details  Cues for BIG posture, step length, and foot clearance    Ambulation Distance (Feet)  400 Feet    Assistive device  None    Gait Pattern  Step-through pattern;Decreased arm swing - right;Decreased  arm swing - left;Decreased step length - right;Right flexed knee in stance;Shuffle;Decreased trunk rotation;Trunk flexed;Poor foot clearance - left;Poor foot clearance - right    Ambulation Surface  Level;Indoor      Self-Care   Self-Care  Other Self-Care Comments    Other Self-Care Comments   Discussed again POC and plans to initiate LSVT BIG after next week.  Explained ratioanale for therapy and benefit to really focus on postural exercises between now and next PT appointment.      Exercises   Exercises  Lumbar;Neck      Neck Exercises: Seated   Neck Retraction  10 reps cues with fingers    Neck Retraction Limitations  Seated chin tucks x 10 reps needs verbal and tactile cues      Neck Exercises: Supine   Neck Retraction  5 reps;3 secs 2 sets-progressing from 2 pillows to one    Other Supine Exercise  scapular retraction/shoulder press into mat 2 sets x 10 reps with manual cues      Lumbar Exercises: Stretches   Active  Hamstring Stretch  Right;3 reps;20 seconds    Active Hamstring Stretch Limitations  Cues for posture, technique seated at edge of mat, foot propped on ground    Pelvic Tilt  10 reps      Lumbar Exercises: Seated   Other Seated Lumbar Exercises  Seated pelvic tilt x 10 reps with initial facilitation    Other Seated Lumbar Exercises  Seated PWR! Up x 10 reps with first 3 reps slowed stretch, then remaining reps with deliberate intensity for upright posture      Lumbar Exercises: Supine   Pelvic Tilt  10 reps             PT Education - 09/04/17 2159    Education provided  Yes    Education Details  HEP additions for posture; plans to start LSVT BIG therapy on 09/17/17    Person(s) Educated  Patient    Methods  Explanation;Demonstration;Handout    Comprehension  Verbalized understanding;Returned demonstration;Verbal cues required          PT Long Term Goals - 07/23/17 1721      PT LONG TERM GOAL #1   Title  Pt will be independent with Parkinson's specific HEP for improved transfers, gait and balance.  TARGET 09/20/17 (pt's treatment sessions begin 08/20/17)    Time  4    Period  Weeks    Status  New    Target Date  09/20/17      PT LONG TERM GOAL #2   Title  Pt will improve 5x sit<>stand to less than or equal to 13 seconds  for improved transfer efficiency and safety.    Time  4    Period  Weeks    Status  New    Target Date  08/20/17      PT LONG TERM GOAL #3   Title  Pt will improve TUG score to less than or equal to 13.5 seconds for decreased fall risk.    Time  4    Period  Weeks    Status  New    Target Date  08/20/17      PT LONG TERM GOAL #4   Title  Pt will improve MiniBESTest score to at least 24/28 for decreased fall risk.    Time  4    Period  Weeks    Status  New    Target Date  08/20/17  PT LONG TERM GOAL #5   Title  Pt will verbalize tips to reduce freezing episodes with gait and turns.    Time  4    Period  Weeks    Status  New    Target  Date  08/20/17      Additional Long Term Goals   Additional Long Term Goals  Yes      PT LONG TERM GOAL #6   Title  Pt will verbalize understanding of posture/positioning/body mechanics for improved posture/decreased back pain.    Time  4    Period  Weeks    Status  New    Target Date  08/20/17      PT LONG TERM GOAL #7   Title  Pt will verbalize plans for continued community fitness upon D/C from PT.    Time  4    Period  Weeks    Status  New    Target Date  08/20/17            Plan - 09/04/17 2159    Clinical Impression Statement  Continued focus on postural exercises-supine, sitting, and gait related posture work.  Pt requires extra time and extra cues for postural exercises.  Will plan to begin LSVT BIG protocol at next visit 09/17/17 to further address posture, balance, gait and functional mobility activities.    Rehab Potential  Good    PT Frequency  4x / week per discussion 08/22/17 session:  2x/wk for 2 more weeks (then will need recert for 4x/wk for 4 weeks)    PT Duration  4 weeks plus eval    PT Treatment/Interventions  ADLs/Self Care Home Management;Gait training;Stair training;Functional mobility training;Therapeutic activities;Therapeutic exercise;Balance training;Neuromuscular re-education;Patient/family education    PT Next Visit Plan  Begin LSVT BIG protocol next visit (items to address:  standing tasks-dishwashing, yardwork, carrying items, walking dog, squats to pick up objects) Will need recert    Consulted and Agree with Plan of Care  Patient       Patient will benefit from skilled therapeutic intervention in order to improve the following deficits and impairments:  Abnormal gait, Decreased balance, Decreased mobility, Decreased strength, Difficulty walking, Impaired flexibility, Postural dysfunction, Impaired tone  Visit Diagnosis: Abnormal posture  Other abnormalities of gait and mobility     Problem List Patient Active Problem List   Diagnosis  Date Noted  . Medicare annual wellness visit, subsequent 12/04/2016  . Obesity 12/04/2016  . Hyperglycemia 02/04/2015  . Elevated blood pressure reading without diagnosis of hypertension 03/05/2013  . Nonspecific abnormal electrocardiogram (ECG) (EKG) 08/31/2011  . Parkinson's disease ([place]) 06/21/2010  . ANXIETY 07/01/2007  . BPH (benign prostatic hyperplasia) 07/01/2007  . HYPERLIPIDEMIA 04/10/2007  . DISORDER, DYSMETABOLIC SYNDROME X 13/88/7195  . DISORDER, DEPRESSIVE NEC 04/10/2007  . ELEVATED PROSTATE SPECIFIC ANTIGEN 04/10/2007  . History of non anemic vitamin B12 deficiency 10/26/2006  . ERECTILE DYSFUNCTION 10/26/2006  . Crohn's disease of small intestine with intestinal obstruction ([place]) 10/26/2006    MARRIOTT,AMY W. 09/04/2017, 10:03 PM  Frazier Butt., PT   [facility] [address] [place], [place], [address] Phone: [phone]   Fax:  [phone]  Name: AYSON CHERUBINI MRN: [id number] Date of Birth: [birth year]

## 2017-09-04 NOTE — Patient Instructions (Signed)
Low back packet of exercises provided to patient: -Seated pelvic tilts -Seated hamstring stretch -Supine scapular retraction -Supine neck retraction -Supine pelvic tilts

## 2017-09-06 ENCOUNTER — Ambulatory Visit: Payer: Medicare Other | Admitting: Physical Therapy

## 2017-09-07 ENCOUNTER — Ambulatory Visit: Payer: Medicare Other | Admitting: Physical Therapy

## 2017-09-10 ENCOUNTER — Ambulatory Visit: Payer: Medicare Other | Admitting: Physical Therapy

## 2017-09-11 ENCOUNTER — Ambulatory Visit: Payer: Medicare Other | Admitting: Physical Therapy

## 2017-09-13 ENCOUNTER — Ambulatory Visit: Payer: Medicare Other | Admitting: Physical Therapy

## 2017-09-14 ENCOUNTER — Ambulatory Visit: Payer: Medicare Other | Admitting: Physical Therapy

## 2017-09-17 ENCOUNTER — Encounter: Payer: Self-pay | Admitting: Physical Therapy

## 2017-09-17 ENCOUNTER — Ambulatory Visit: Payer: Medicare Other | Attending: Nurse Practitioner | Admitting: Physical Therapy

## 2017-09-17 ENCOUNTER — Ambulatory Visit: Payer: Medicare Other | Admitting: Physical Therapy

## 2017-09-17 DIAGNOSIS — R293 Abnormal posture: Secondary | ICD-10-CM | POA: Insufficient documentation

## 2017-09-17 DIAGNOSIS — M6281 Muscle weakness (generalized): Secondary | ICD-10-CM | POA: Insufficient documentation

## 2017-09-17 DIAGNOSIS — R2681 Unsteadiness on feet: Secondary | ICD-10-CM | POA: Insufficient documentation

## 2017-09-17 DIAGNOSIS — R29818 Other symptoms and signs involving the nervous system: Secondary | ICD-10-CM | POA: Diagnosis not present

## 2017-09-17 DIAGNOSIS — R2689 Other abnormalities of gait and mobility: Secondary | ICD-10-CM

## 2017-09-17 NOTE — Therapy (Signed)
[facility] [address] [place], [place], [address] Phone: [phone]   Fax:  [phone]  Physical Therapy Treatment  Patient Details  Name: Kyle Ball MRN: [id number] Date of Birth: 12/19/1945 Referring Provider: Pura Spice   Encounter Date: 09/17/2017  PT End of Session - 09/17/17 2137    Visit Number  8    Number of Visits  24 per recert 10/15/35    Date for PT Re-Evaluation  11/16/17    Authorization Type  Medicare and BCBS    PT Start Time  1216    PT Stop Time  1315    PT Time Calculation (min)  59 min    Activity Tolerance  Patient tolerated treatment well    Behavior During Therapy  [facility] for tasks assessed/performed;Flat affect       Past Medical History:  Diagnosis Date  . Anxiety and depression   . BPH (benign prostatic hypertrophy)   . Crohn's disease ([place]) 1979   After resection, 1 flare in 2012.  Marland Kitchen Dysmetabolic syndrome X   . ED (erectile dysfunction)   . Hyperlipidemia   . Internal hemorrhoids   . Parkinson's disease ([place])    [facility]    Past Surgical History:  Procedure Laterality Date  . COLONOSCOPY W/ BIOPSIES   06/2007, 2015   Crohn's, internal hemorrhoids, Dr Carlean Purl  . KNEE ARTHROSCOPY Right 06/2013  . PROSTATE BIOPSY     X 2, Dr Risa Grill  . PTNS Treatment     urinary incontinence  . QUADRICEPS REPAIR Right 1966    knee  . REFRACTIVE SURGERY  2015   Dr Bing Plume  . Terminal Ileum & Appendix Resected  1995   Crohn's    There were no vitals filed for this visit.  Subjective Assessment - 09/17/17 1218    Subjective  My back gave out on me this past week-had to rest several days, due to overdoing it in the garage.    Pertinent History  OSA, recent cardiac ultrasounds (awaiting results)    Patient Stated Goals  Pt's goals for therapy are to improve stooped posture, especially with walking    Currently in Pain?  Yes    Pain Score  5     Pain Location  Back    Pain  Orientation  Right;Left;Lower    Pain Descriptors / Indicators  Aching    Pain Type  Chronic pain    Pain Onset  More than a month ago    Aggravating Factors   bending    Pain Relieving Factors  sitting down                       [facility] Adult PT Treatment/Exercise - 09/17/17 0001      Transfers   Transfers  Sit to Stand;Stand to Sit    Sit to Stand  6: Modified independent (Device/Increase time);With upper extremity assist;From chair/3-in-1;From bed    Five time sit to stand comments   15.94    Stand to Sit  6: Modified independent (Device/Increase time);With upper extremity assist;To chair/3-in-1;To bed      Ambulation/Gait   Ambulation/Gait  Yes    Ambulation/Gait Assistance  5: Supervision    Ambulation/Gait Assistance Details  Verbal, visual cues for BIG posture, arm swing, step length    Ambulation Distance (Feet)  50 Feet x 8 reps in gym, then 100 ft x 2    Assistive device  None  Gait Pattern  Step-through pattern;Decreased arm swing - right;Decreased arm swing - left;Decreased step length - right;Right flexed knee in stance;Shuffle;Decreased trunk rotation;Trunk flexed;Poor foot clearance - left;Poor foot clearance - right    Ambulation Surface  Level;Indoor      Standardized Balance Assessment   Standardized Balance Assessment  Timed Up and Go Test      Timed Up and Go Test   TUG  Normal TUG    Normal TUG (seconds)  10.6         LSVT [facility]) - 09/17/17 2127    Floor to ceiling  Other reps 8 reps    Side to side  Other reps 8 reps    Step and Reach Forward  10 reps    Step and Reach Backward  10 reps    Step and Reach Sideways  10 reps    Rock and Reach Forward/Backward  10 reps    Rock and Reach Sideways  10 reps    1 - Sit to stand  Other reps (comment) 5 reps from 20" mat, 5 reps from 18" chair      For all exercises above, PT provides visual, verbal, and tactile cues for proper technique of exercise, for several reps to start, then initiated  full set.  Cues for BIG posture (especially through neck) given throughout.  Once provided handout for HEP-pt return demo seated sustained floor to ceiling and side to side reach, 3 reps each with HEP handout   PT Education - 09/17/17 2136    Education provided  Yes    Education Details  Initiated LSVT BIG HEP-maximal daily exercises (sitting) and functional component task (sit<>stand) and BIG walking    Person(s) Educated  Patient    Methods  Explanation;Demonstration;Handout    Comprehension  Verbalized understanding;Returned demonstration;Need further instruction          PT Long Term Goals - 09/17/17 1315      PT LONG TERM GOAL #1   Title  Pt will be independent with Parkinson's specific HEP for improved transfers, gait and balance.  UPDATED TARGET 10/12/17    Time  4 per recert 11/13/82    Period  Weeks    Status  On-going    Target Date  10/12/17      PT LONG TERM GOAL #2   Title  Pt will improve 5x sit<>stand to less than or equal to 13 seconds  for improved transfer efficiency and safety.    Baseline  15.94 sec 09/17/17    Time  4 per recert 11/15/57    Period  Weeks    Status  On-going    Target Date  10/12/17      PT LONG TERM GOAL #3   Title  Pt will improve TUG score to less than or equal to 13.5 seconds for decreased fall risk.    Baseline  10.6    Time  4    Period  Weeks    Status  Achieved      PT LONG TERM GOAL #4   Title  Pt will improve MiniBESTest score to at least 24/28 for decreased fall risk.    Time  4 per recert 02/13/56    Period  Weeks    Status  On-going    Target Date  10/12/17      PT LONG TERM GOAL #5   Title  Pt will verbalize tips to reduce freezing episodes with gait and turns.    Time  4  per recert 06/15/21    Period  Weeks    Status  On-going    Target Date  10/12/17      PT LONG TERM GOAL #6   Title  Pt will verbalize understanding of posture/positioning/body mechanics for improved posture/decreased back pain.    Time  4 per recert  02/15/31    Period  Weeks    Status  On-going    Target Date  10/12/17      PT LONG TERM GOAL #7   Title  Pt will verbalize plans for continued community fitness upon D/C from PT.    Time  4 per recert 0/2/33    Period  Weeks    Status  On-going    Target Date  10/12/17            Plan - 09/17/17 2144    Clinical Impression Statement  Assessed LTGs this visit, with LTG 1, 2, 4, 5, 6 ongoing.  LTG 3 met for improved TUG score, 5x sit<>stand has improved.  Pt has been seen for several weeks of visits for postural exercises, transfer training and large amplitude movement trials in preparation for initiation of LSVT BIG program starting this visit.  Pt needs mod-max verbal, visual, tactile cues for technique, especially for improved head, neck posture.  Pt will benefit from large amplitude high intensity movement patterns through LSVT BIG.  Please see renewal/recert for this set of visits.    Rehab Potential  Good    PT Frequency  4x / week per recert 09/13/54    PT Duration  4 weeks    PT Treatment/Interventions  ADLs/Self Care Home Management;Gait training;Stair training;Functional mobility training;Therapeutic activities;Therapeutic exercise;Balance training;Neuromuscular re-education;Patient/family education    PT Next Visit Plan  LSVT BIG maximal daily exercises, add to HEP, functional component tasks and begin hierarch tasks (items to address:  standing tasks-dishwashing, yardwork, carrying items, walking dog, squats to pick up objects)    Consulted and Agree with Plan of Care  Patient       Patient will benefit from skilled therapeutic intervention in order to improve the following deficits and impairments:  Abnormal gait, Decreased balance, Decreased mobility, Decreased strength, Difficulty walking, Impaired flexibility, Postural dysfunction, Impaired tone  Visit Diagnosis: Abnormal posture  Unsteadiness on feet  Other abnormalities of gait and mobility  Other symptoms and  signs involving the nervous system  Muscle weakness (generalized)     Problem List Patient Active Problem List   Diagnosis Date Noted  . Medicare annual wellness visit, subsequent 12/04/2016  . Obesity 12/04/2016  . Hyperglycemia 02/04/2015  . Elevated blood pressure reading without diagnosis of hypertension 03/05/2013  . Nonspecific abnormal electrocardiogram (ECG) (EKG) 08/31/2011  . Parkinson's disease ([place]) 06/21/2010  . ANXIETY 07/01/2007  . BPH (benign prostatic hyperplasia) 07/01/2007  . HYPERLIPIDEMIA 04/10/2007  . DISORDER, DYSMETABOLIC SYNDROME X 86/16/8372  . DISORDER, DEPRESSIVE NEC 04/10/2007  . ELEVATED PROSTATE SPECIFIC ANTIGEN 04/10/2007  . History of non anemic vitamin B12 deficiency 10/26/2006  . ERECTILE DYSFUNCTION 10/26/2006  . Crohn's disease of small intestine with intestinal obstruction ([place]) 10/26/2006    Nonna Renninger W. 09/17/2017, 9:52 PM Frazier Butt., PT   [facility] [address] [place], [place], [address] Phone: [phone]   Fax:  [phone]  Name: CAIDON FOTI MRN: [id number] Date of Birth: [birth year]

## 2017-09-17 NOTE — Patient Instructions (Addendum)
   WALKING FOR EXERCISE   -Walk BIG effort walking in the hallway of your home or loop of rooms at home 3-5 minutes at a time -BIG STEPS and BIG ARM SWING   Provided LSVT BIG handouts for Seated Sustained Movements Forward and Side to side, 8 reps, 1-2 times per day  Provided LSVT BIG sit to stand functional component task

## 2017-09-18 ENCOUNTER — Encounter: Payer: Self-pay | Admitting: Physical Therapy

## 2017-09-18 ENCOUNTER — Ambulatory Visit: Payer: Medicare Other | Admitting: Physical Therapy

## 2017-09-18 DIAGNOSIS — R2689 Other abnormalities of gait and mobility: Secondary | ICD-10-CM

## 2017-09-18 DIAGNOSIS — R2681 Unsteadiness on feet: Secondary | ICD-10-CM

## 2017-09-18 DIAGNOSIS — R29818 Other symptoms and signs involving the nervous system: Secondary | ICD-10-CM | POA: Diagnosis not present

## 2017-09-18 DIAGNOSIS — M6281 Muscle weakness (generalized): Secondary | ICD-10-CM | POA: Diagnosis not present

## 2017-09-18 DIAGNOSIS — R293 Abnormal posture: Secondary | ICD-10-CM

## 2017-09-18 NOTE — Therapy (Signed)
[facility] [address] [place], [place], [address] Phone: [phone]   Fax:  [phone]  Physical Therapy Treatment  Patient Details  Name: Kyle Ball MRN: [id number] Date of Birth: [birth year] Referring Provider: Pura Spice   Encounter Date: 09/18/2017  PT End of Session - 09/18/17 2257    Visit Number  9    Number of Visits  24 per recert 07/21/50    Date for PT Re-Evaluation  11/16/17    Authorization Type  Medicare and BCBS    PT Start Time  1157 pt arrived late    PT Stop Time  1255    PT Time Calculation (min)  58 min    Activity Tolerance  Patient tolerated treatment well    Behavior During Therapy  [facility] for tasks assessed/performed;Flat affect       Past Medical History:  Diagnosis Date  . Anxiety and depression   . BPH (benign prostatic hypertrophy)   . Crohn's disease ([place]) 1979   After resection, 1 flare in 2012.  Marland Kitchen Dysmetabolic syndrome X   . ED (erectile dysfunction)   . Hyperlipidemia   . Internal hemorrhoids   . Parkinson's disease ([place])    [facility]    Past Surgical History:  Procedure Laterality Date  . COLONOSCOPY W/ BIOPSIES   06/2007, 2015   Crohn's, internal hemorrhoids, Dr Carlean Purl  . KNEE ARTHROSCOPY Right 06/2013  . PROSTATE BIOPSY     X 2, Dr Risa Grill  . PTNS Treatment     urinary incontinence  . QUADRICEPS REPAIR Right 1966    knee  . REFRACTIVE SURGERY  2015   Dr Bing Plume  . Terminal Ileum & Appendix Resected  1995   Crohn's    There were no vitals filed for this visit.  Subjective Assessment - 09/18/17 2248    Subjective  A little soreness today; did the seated exercises as you instructed last night.    Pertinent History  OSA, recent cardiac ultrasounds (awaiting results)    Patient Stated Goals  Pt's goals for therapy are to improve stooped posture, especially with walking    Currently in Pain?  Yes    Pain Score  5     Pain Location  Back    Pain  Orientation  Right;Left;Lower    Pain Descriptors / Indicators  Aching    Pain Type  Chronic pain    Pain Onset  More than a month ago    Pain Frequency  Intermittent    Aggravating Factors   bending, doing the exercises increased muscle soreness    Pain Relieving Factors  medication                       [facility] Adult PT Treatment/Exercise - 09/18/17 0001      Ambulation/Gait   Ambulation/Gait  Yes    Ambulation/Gait Assistance  6: Modified independent (Device/Increase time)    Ambulation/Gait Assistance Details  Verbal, visual cues for BIG posture, step length, arm swing    Ambulation Distance (Feet)  50 Feet x 8 reps, with rocking turns to change directions    Assistive device  None    Gait Pattern  Step-through pattern;Decreased arm swing - right;Decreased arm swing - left;Decreased step length - right;Right flexed knee in stance;Shuffle;Decreased trunk rotation;Trunk flexed;Poor foot clearance - left;Poor foot clearance - right    Ambulation Surface  Level;Indoor    Pre-Gait Activities  Additional gait, 20 ft x 4  reps, with cues for initial rocking to start BIG first step    Gait Comments  Brief rest in between bouts of gait.  Discussed adding walking to exercise program at home, in hallway, with turns         LSVT [facility]) - 09/18/17 2249    Floor to ceiling  Other reps 8 reps    Side to side  Other reps 8 reps    Step and Reach Forward  10 reps    Step and Reach Backward  10 reps    Step and Reach Sideways  10 reps    Rock and Reach Forward/Backward  10 reps    Rock and Reach Sideways  10 reps    1 - Sit to stand  Other reps (comment) 5 reps from 20" mat, 18" chair    Other Tasks 1  Standing rocking/lateral weightshifting at counter, 10 reps, for improved standing, initiating gait and rocking to turn     Verbal, visual, and tactile cues provided for maximal BIG effort with posture, reach, stepping with above exercises.  With each of the above exercises, 2-3  initial reps performed with therapist modeling and shaping best movement pattern, then began count for full set of 8-10 reps once pt demo correct form.    PT Education - 09/18/17 2256    Education provided  Yes    Education Details  Provided standing step and reach maximal daily exercises, rocking for functional component task, BIG walking    Person(s) Educated  Patient    Methods  Explanation;Demonstration;Handout    Comprehension  Verbalized understanding;Verbal cues required;Need further instruction          PT Long Term Goals - 09/17/17 1315      PT LONG TERM GOAL #1   Title  Pt will be independent with Parkinson's specific HEP for improved transfers, gait and balance.  UPDATED TARGET 10/12/17    Time  4 per recert 06/17/08    Period  Weeks    Status  On-going    Target Date  10/12/17      PT LONG TERM GOAL #2   Title  Pt will improve 5x sit<>stand to less than or equal to 13 seconds  for improved transfer efficiency and safety.    Baseline  15.94 sec 09/17/17    Time  4 per recert 02/16/03    Period  Weeks    Status  On-going    Target Date  10/12/17      PT LONG TERM GOAL #3   Title  Pt will improve TUG score to less than or equal to 13.5 seconds for decreased fall risk.    Baseline  10.6    Time  4    Period  Weeks    Status  Achieved      PT LONG TERM GOAL #4   Title  Pt will improve MiniBESTest score to at least 24/28 for decreased fall risk.    Time  4 per recert 10/14/07    Period  Weeks    Status  On-going    Target Date  10/12/17      PT LONG TERM GOAL #5   Title  Pt will verbalize tips to reduce freezing episodes with gait and turns.    Time  4 per recert 01/10/18    Period  Weeks    Status  On-going    Target Date  10/12/17      PT LONG TERM GOAL #6  Title  Pt will verbalize understanding of posture/positioning/body mechanics for improved posture/decreased back pain.    Time  4 per recert 02/18/32    Period  Weeks    Status  On-going    Target Date   10/12/17      PT LONG TERM GOAL #7   Title  Pt will verbalize plans for continued community fitness upon D/C from PT.    Time  4 per recert 01/11/49    Period  Weeks    Status  On-going    Target Date  10/12/17            Plan - 09/18/17 2258    Clinical Impression Statement  Pt continues to need mod-max cueing for maximal BIG effort with large amplitude and high intensity movement patterns.  In addition to maximal daily exercises in sitting and standing, added standing rocking and weightshifting for initiation of gait and improved turns as part of HEP.  Pt continues to benefit from skilled PT to address posture, gait and balance.    Rehab Potential  Good    PT Frequency  4x / week per recert 10/13/95    PT Duration  4 weeks    PT Treatment/Interventions  ADLs/Self Care Home Management;Gait training;Stair training;Functional mobility training;Therapeutic activities;Therapeutic exercise;Balance training;Neuromuscular re-education;Patient/family education    PT Next Visit Plan  LSVT BIG maximal daily exercises, add to HEP, functional component tasks and begin hierarchy tasks (items to address:  standing tasks-dishwashing, yardwork, carrying items, walking dog, squats to pick up objects) Provide chart to kep exercises organized    Consulted and Agree with Plan of Care  Patient       Patient will benefit from skilled therapeutic intervention in order to improve the following deficits and impairments:  Abnormal gait, Decreased balance, Decreased mobility, Decreased strength, Difficulty walking, Impaired flexibility, Postural dysfunction, Impaired tone  Visit Diagnosis: Abnormal posture  Unsteadiness on feet  Other abnormalities of gait and mobility     Problem List Patient Active Problem List   Diagnosis Date Noted  . Medicare annual wellness visit, subsequent 12/04/2016  . Obesity 12/04/2016  . Hyperglycemia 02/04/2015  . Elevated blood pressure reading without diagnosis of  hypertension 03/05/2013  . Nonspecific abnormal electrocardiogram (ECG) (EKG) 08/31/2011  . Parkinson's disease ([place]) 06/21/2010  . ANXIETY 07/01/2007  . BPH (benign prostatic hyperplasia) 07/01/2007  . HYPERLIPIDEMIA 04/10/2007  . DISORDER, DYSMETABOLIC SYNDROME X 67/34/1937  . DISORDER, DEPRESSIVE NEC 04/10/2007  . ELEVATED PROSTATE SPECIFIC ANTIGEN 04/10/2007  . History of non anemic vitamin B12 deficiency 10/26/2006  . ERECTILE DYSFUNCTION 10/26/2006  . Crohn's disease of small intestine with intestinal obstruction ([place]) 10/26/2006    Minor Iden W. 09/18/2017, 11:01 PM  Frazier Butt., PT   [facility] [address] [place], [place], [address] Phone: [phone]   Fax:  [phone]  Name: Kyle Ball MRN: [id number] Date of Birth: 01/12/1946

## 2017-09-19 ENCOUNTER — Ambulatory Visit: Payer: Medicare Other | Admitting: Physical Therapy

## 2017-09-19 DIAGNOSIS — R2689 Other abnormalities of gait and mobility: Secondary | ICD-10-CM

## 2017-09-19 DIAGNOSIS — R293 Abnormal posture: Secondary | ICD-10-CM | POA: Diagnosis not present

## 2017-09-19 DIAGNOSIS — R2681 Unsteadiness on feet: Secondary | ICD-10-CM

## 2017-09-19 DIAGNOSIS — R29818 Other symptoms and signs involving the nervous system: Secondary | ICD-10-CM | POA: Diagnosis not present

## 2017-09-19 DIAGNOSIS — M6281 Muscle weakness (generalized): Secondary | ICD-10-CM | POA: Diagnosis not present

## 2017-09-20 ENCOUNTER — Ambulatory Visit: Payer: Medicare Other | Admitting: Physical Therapy

## 2017-09-20 DIAGNOSIS — N3941 Urge incontinence: Secondary | ICD-10-CM | POA: Diagnosis not present

## 2017-09-20 NOTE — Therapy (Signed)
[facility] [address] [place], [place], [address] Phone: [phone]   Fax:  [phone]  Physical Therapy Treatment  Patient Details  Name: Kyle Ball MRN: [id number] Date of Birth: [birth year] Referring Provider: Pura Spice   Encounter Date: 09/19/2017  PT End of Session - 09/20/17 1550    Visit Number  10    Number of Visits  24 per recert 07/20/76    Date for PT Re-Evaluation  11/16/17    Authorization Type  Medicare and BCBS    PT Start Time  1103    PT Stop Time  1201    PT Time Calculation (min)  58 min    Activity Tolerance  Patient tolerated treatment well    Behavior During Therapy  [facility] for tasks assessed/performed;Flat affect       Past Medical History:  Diagnosis Date  . Anxiety and depression   . BPH (benign prostatic hypertrophy)   . Crohn's disease ([place]) 1979   After resection, 1 flare in 2012.  Marland Kitchen Dysmetabolic syndrome X   . ED (erectile dysfunction)   . Hyperlipidemia   . Internal hemorrhoids   . Parkinson's disease ([place])    [facility]    Past Surgical History:  Procedure Laterality Date  . COLONOSCOPY W/ BIOPSIES   06/2007, 2015   Crohn's, internal hemorrhoids, Dr Carlean Purl  . KNEE ARTHROSCOPY Right 06/2013  . PROSTATE BIOPSY     X 2, Dr Risa Grill  . PTNS Treatment     urinary incontinence  . QUADRICEPS REPAIR Right 1966    knee  . REFRACTIVE SURGERY  2015   Dr Bing Plume  . Terminal Ileum & Appendix Resected  1995   Crohn's    There were no vitals filed for this visit.  Subjective Assessment - 09/19/17 1105    Subjective  Did 2 exercises last night (sit to stand and rocking); did 3 laps of the hallway.  The rocking really helped with washing dishes.    Pertinent History  OSA, recent cardiac ultrasounds (awaiting results)    Patient Stated Goals  Pt's goals for therapy are to improve stooped posture, especially with walking    Currently in Pain?  Yes    Pain Score  4     Pain Location  Back    Pain Orientation  Right;Left;Lower    Pain Descriptors / Indicators  Aching    Pain Type  Chronic pain    Pain Onset  More than a month ago    Pain Frequency  Intermittent    Aggravating Factors   bending, exercises sometimes aggravate    Pain Relieving Factors  medication                       [facility] Adult PT Treatment/Exercise - 09/20/17 0001      Ambulation/Gait   Ambulation/Gait  Yes    Ambulation/Gait Assistance  6: Modified independent (Device/Increase time)    Ambulation/Gait Assistance Details  Verbal, visual cues for best BIG posture with gait    Ambulation Distance (Feet)  80 Feet x 4; 120 ft x 2    Assistive device  None    Gait Pattern  Step-through pattern;Decreased arm swing - right;Decreased arm swing - left;Decreased step length - right;Right flexed knee in stance;Shuffle;Decreased trunk rotation;Trunk flexed;Poor foot clearance - left;Poor foot clearance - right    Ambulation Surface  Level;Indoor         LSVT [facility]) - 09/20/17  1547    Floor to ceiling  Other reps 8 reps-10 second hold    Side to side  Other reps 8 reps-10 second hold    Step and Reach Forward  10 reps each side    Step and Reach Backward  10 reps each side    Step and Reach Sideways  10 reps each side    Rock and Reach Forward/Backward  10 reps each side with 1 UE arm swing    Rock and Reach Sideways  10 reps each side with coordinated arm swing/reach    1 - Sit to stand  Other reps (comment) 5 reps each, from 20" mat, then from 18" chair    Other Tasks 1  Standing rocking, then standing rocking and reaching, then standing rocking for turns    Task 1  Full 360 degree turns to R and to L, 3 reps each direction, with cues for BIG Rock to turn    Task 2  Stepping over object (as in dog gate)-, simulating task for home, with cues for BIG effort       Pt needs tactile cues for modeling and shaping of daily exercises; max VCs for BIG effort       PT Long  Term Goals - 09/17/17 1315      PT LONG TERM GOAL #1   Title  Pt will be independent with Parkinson's specific HEP for improved transfers, gait and balance.  UPDATED TARGET 10/12/17    Time  4 per recert 08/10/52    Period  Weeks    Status  On-going    Target Date  10/12/17      PT LONG TERM GOAL #2   Title  Pt will improve 5x sit<>stand to less than or equal to 13 seconds  for improved transfer efficiency and safety.    Baseline  15.94 sec 09/17/17    Time  4 per recert 0/0/86    Period  Weeks    Status  On-going    Target Date  10/12/17      PT LONG TERM GOAL #3   Title  Pt will improve TUG score to less than or equal to 13.5 seconds for decreased fall risk.    Baseline  10.6    Time  4    Period  Weeks    Status  Achieved      PT LONG TERM GOAL #4   Title  Pt will improve MiniBESTest score to at least 24/28 for decreased fall risk.    Time  4 per recert 12/15/17    Period  Weeks    Status  On-going    Target Date  10/12/17      PT LONG TERM GOAL #5   Title  Pt will verbalize tips to reduce freezing episodes with gait and turns.    Time  4 per recert 5/0/93    Period  Weeks    Status  On-going    Target Date  10/12/17      PT LONG TERM GOAL #6   Title  Pt will verbalize understanding of posture/positioning/body mechanics for improved posture/decreased back pain.    Time  4 per recert 07/18/69    Period  Weeks    Status  On-going    Target Date  10/12/17      PT LONG TERM GOAL #7   Title  Pt will verbalize plans for continued community fitness upon D/C from PT.    Time  4 per recert 0/8/67    Period  Weeks    Status  On-going    Target Date  10/12/17            Plan - 09/20/17 1551    Clinical Impression Statement  Pt continues to need mod-max cueing ofr maximal BIG effort with large amplitude and high intensity (especially with head/neck, as he is frequently looking down at floor).  Pt rates his effort level at 7-8/10, but he does not apear that he is able to break  through with bigger movement patterns.  Educated patient in benefit of performing ALL exercises at home each day.    Rehab Potential  Good    PT Frequency  4x / week per recert 11/10/93    PT Duration  4 weeks    PT Treatment/Interventions  ADLs/Self Care Home Management;Gait training;Stair training;Functional mobility training;Therapeutic activities;Therapeutic exercise;Balance training;Neuromuscular re-education;Patient/family education    PT Next Visit Plan  LSVT BIG maximal daily exercises, add to HEP, functional component tasks and begin hierarchy tasks (items to address:  standing tasks-dishwashing, yardwork, carrying items, walking dog, squats to pick up objects) Provide chart to kep exercises organized    Consulted and Agree with Plan of Care  Patient       Patient will benefit from skilled therapeutic intervention in order to improve the following deficits and impairments:  Abnormal gait, Decreased balance, Decreased mobility, Decreased strength, Difficulty walking, Impaired flexibility, Postural dysfunction, Impaired tone  Visit Diagnosis: Abnormal posture  Unsteadiness on feet  Other abnormalities of gait and mobility  Other symptoms and signs involving the nervous system     Problem List Patient Active Problem List   Diagnosis Date Noted  . Medicare annual wellness visit, subsequent 12/04/2016  . Obesity 12/04/2016  . Hyperglycemia 02/04/2015  . Elevated blood pressure reading without diagnosis of hypertension 03/05/2013  . Nonspecific abnormal electrocardiogram (ECG) (EKG) 08/31/2011  . Parkinson's disease ([place]) 06/21/2010  . ANXIETY 07/01/2007  . BPH (benign prostatic hyperplasia) 07/01/2007  . HYPERLIPIDEMIA 04/10/2007  . DISORDER, DYSMETABOLIC SYNDROME X 09/32/6712  . DISORDER, DEPRESSIVE NEC 04/10/2007  . ELEVATED PROSTATE SPECIFIC ANTIGEN 04/10/2007  . History of non anemic vitamin B12 deficiency 10/26/2006  . ERECTILE DYSFUNCTION 10/26/2006  . Crohn's disease  of small intestine with intestinal obstruction ([place]) 10/26/2006    Messina Kosinski W. 09/20/2017, 3:54 PM  Frazier Butt., PT   [facility] [address] [place], [place], [address] Phone: [phone]   Fax:  [phone]  Name: OMKAR STRATMANN MRN: [id number] Date of Birth: [birth year]

## 2017-09-21 ENCOUNTER — Ambulatory Visit: Payer: Medicare Other | Admitting: Physical Therapy

## 2017-09-21 ENCOUNTER — Encounter: Payer: Self-pay | Admitting: Physical Therapy

## 2017-09-21 ENCOUNTER — Ambulatory Visit: Payer: Medicare Other | Admitting: Family Medicine

## 2017-09-21 DIAGNOSIS — R2689 Other abnormalities of gait and mobility: Secondary | ICD-10-CM

## 2017-09-21 DIAGNOSIS — R293 Abnormal posture: Secondary | ICD-10-CM

## 2017-09-21 DIAGNOSIS — M6281 Muscle weakness (generalized): Secondary | ICD-10-CM | POA: Diagnosis not present

## 2017-09-21 DIAGNOSIS — R29818 Other symptoms and signs involving the nervous system: Secondary | ICD-10-CM | POA: Diagnosis not present

## 2017-09-21 DIAGNOSIS — R2681 Unsteadiness on feet: Secondary | ICD-10-CM | POA: Diagnosis not present

## 2017-09-21 NOTE — Therapy (Signed)
[facility] [address] [place], [place], [address] Phone: [phone]   Fax:  [phone]  Physical Therapy Treatment  Patient Details  Name: Kyle Ball MRN: [id number] Date of Birth: March 04, 1946 Referring Provider: Pura Spice   Encounter Date: 09/21/2017  PT End of Session - 09/21/17 1324    Visit Number  11    Number of Visits  24 per recert 11/16/42    Date for PT Re-Evaluation  11/16/17    Authorization Type  Medicare and BCBS    PT Start Time  1150    PT Stop Time  1252    PT Time Calculation (min)  62 min    Activity Tolerance  Patient tolerated treatment well    Behavior During Therapy  [facility] for tasks assessed/performed;Flat affect       Past Medical History:  Diagnosis Date  . Anxiety and depression   . BPH (benign prostatic hypertrophy)   . Crohn's disease ([place]) 1979   After resection, 1 flare in 2012.  Marland Kitchen Dysmetabolic syndrome X   . ED (erectile dysfunction)   . Hyperlipidemia   . Internal hemorrhoids   . Parkinson's disease ([place])    [facility]    Past Surgical History:  Procedure Laterality Date  . COLONOSCOPY W/ BIOPSIES   06/2007, 2015   Crohn's, internal hemorrhoids, Dr Carlean Purl  . KNEE ARTHROSCOPY Right 06/2013  . PROSTATE BIOPSY     X 2, Dr Risa Grill  . PTNS Treatment     urinary incontinence  . QUADRICEPS REPAIR Right 1966    knee  . REFRACTIVE SURGERY  2015   Dr Bing Plume  . Terminal Ileum & Appendix Resected  1995   Crohn's    There were no vitals filed for this visit.  Subjective Assessment - 09/21/17 1153    Subjective  Did a few of the exercises.  Which of the exercises are the most important especially for posture?  (Had to explain again rationale for LSVT, attention to movement patterns and posture, deliberate BIG effort)    Pertinent History  OSA, recent cardiac ultrasounds (awaiting results)    Patient Stated Goals  Pt's goals for therapy are to improve stooped  posture, especially with walking    Currently in Pain?  Yes    Pain Score  4     Pain Location  Back    Pain Orientation  Right;Left;Lower    Pain Descriptors / Indicators  Aching;Dull    Pain Type  Chronic pain    Pain Onset  More than a month ago    Pain Frequency  Intermittent    Aggravating Factors   bending, exercises sometimes aggravate    Pain Relieving Factors  medication                       [facility] Adult PT Treatment/Exercise - 09/21/17 0001      Ambulation/Gait   Ambulation/Gait  Yes    Ambulation/Gait Assistance  6: Modified independent (Device/Increase time)    Ambulation/Gait Assistance Details  verbal, visual, tactile cues for best BIG posture    Ambulation Distance (Feet)  240 Feet 360    Assistive device  None    Gait Pattern  Step-through pattern;Decreased arm swing - right;Decreased arm swing - left;Decreased step length - right;Right flexed knee in stance;Shuffle;Decreased trunk rotation;Trunk flexed;Poor foot clearance - left;Poor foot clearance - right    Ambulation Surface  Level;Indoor  LSVT ([facility]) - Maximal Daily Exercises   Floor to ceiling  Other reps 8 reps-10 second hold    Side to side  Other reps 8 reps-10 second hold    Step and Reach Forward  10 reps each side    Step and Reach Backward  10 reps each side    Step and Reach Sideways  10 reps each side    Rock and Reach Forward/Backward  10 reps each side with 1 UE arm swing    Rock and Reach Sideways  10 reps each side with coordinated arm swing/reach    Functional Component Task:  1 - Sit to stand  Other reps (comment) 5 reps each, from 20" mat, 2 sets    Task 2  Standing rocking, 5-10 reps to initiate gait for short distance/turns, multiple reps   Hierarchy Task 1  Full 360 degree turns to R and to L, 2reps each direction, with cues for BIG Rock to turn    HierarchyTask 2  Stepping over object (as in dog gate)-, simulating task for home, with cues for BIG effort  to get close to target, then BIG step over, then rocking to initiate 2nd step BIG; practiced at least 3 reps     Hierarchy Task 3     Turning to sit in chair (in response to pt's reported difficulty with positioning when turning to sit in recliner.)  Practiced full        turn BIG to sit "squared up" into chair.  Repeated 5 times.   Pt needs tactile cues for modeling and shaping of daily exercises; max VCs for BIG effort.  Each exercise-pt performs several to get correct technique, then starts count.              PT Education - 09/21/17 1324    Education provided  Yes    Education Details  Provided updated LSVT BIG exercise pictures    Person(s) Educated  Patient    Methods  Explanation;Demonstration;Verbal cues;Handout    Comprehension  Verbalized understanding;Returned demonstration;Verbal cues required          PT Long Term Goals - 09/17/17 1315      PT LONG TERM GOAL #1   Title  Pt will be independent with Parkinson's specific HEP for improved transfers, gait and balance.  UPDATED TARGET 10/12/17    Time  4 per recert 09/12/66    Period  Weeks    Status  On-going    Target Date  10/12/17      PT LONG TERM GOAL #2   Title  Pt will improve 5x sit<>stand to less than or equal to 13 seconds  for improved transfer efficiency and safety.    Baseline  15.94 sec 09/17/17    Time  4 per recert 08/13/17    Period  Weeks    Status  On-going    Target Date  10/12/17      PT LONG TERM GOAL #3   Title  Pt will improve TUG score to less than or equal to 13.5 seconds for decreased fall risk.    Baseline  10.6    Time  4    Period  Weeks    Status  Achieved      PT LONG TERM GOAL #4   Title  Pt will improve MiniBESTest score to at least 24/28 for decreased fall risk.    Time  4 per recert 11/11/20    Period  Weeks  Status  On-going    Target Date  10/12/17      PT LONG TERM GOAL #5   Title  Pt will verbalize tips to reduce freezing episodes with gait and turns.    Time  4  per recert 4/0/81    Period  Weeks    Status  On-going    Target Date  10/12/17      PT LONG TERM GOAL #6   Title  Pt will verbalize understanding of posture/positioning/body mechanics for improved posture/decreased back pain.    Time  4 per recert 09/14/79    Period  Weeks    Status  On-going    Target Date  10/12/17      PT LONG TERM GOAL #7   Title  Pt will verbalize plans for continued community fitness upon D/C from PT.    Time  4 per recert 01/14/62    Period  Weeks    Status  On-going    Target Date  10/12/17            Plan - 09/21/17 1325    Clinical Impression Statement  Pt able to perform all maximal daily exercises without UE support this visit, but continues to need mod-max cueing for maximal BIG effort to achieve larger amplitude of movements.  However, unsure of pt's full performance of HEP at home, but PT did re-emphasize rationale and instructions for LSVT BIG protocol.    Rehab Potential  Good    PT Frequency  4x / week per recert 06/16/95    PT Duration  4 weeks    PT Treatment/Interventions  ADLs/Self Care Home Management;Gait training;Stair training;Functional mobility training;Therapeutic activities;Therapeutic exercise;Balance training;Neuromuscular re-education;Patient/family education    PT Next Visit Plan  LSVT BIG maximal daily exercises, add to HEP, functional component tasks and begin hierarchy tasks (items to address:  standing tasks-dishwashing, yardwork, carrying items, walking dog, squats to pick up objects) Provide chart to kep exercises organized    Consulted and Agree with Plan of Care  Patient       Patient will benefit from skilled therapeutic intervention in order to improve the following deficits and impairments:  Abnormal gait, Decreased balance, Decreased mobility, Decreased strength, Difficulty walking, Impaired flexibility, Postural dysfunction, Impaired tone  Visit Diagnosis: Abnormal posture  Unsteadiness on feet  Other abnormalities  of gait and mobility  Other symptoms and signs involving the nervous system     Problem List Patient Active Problem List   Diagnosis Date Noted  . Medicare annual wellness visit, subsequent 12/04/2016  . Obesity 12/04/2016  . Hyperglycemia 02/04/2015  . Elevated blood pressure reading without diagnosis of hypertension 03/05/2013  . Nonspecific abnormal electrocardiogram (ECG) (EKG) 08/31/2011  . Parkinson's disease ([place]) 06/21/2010  . ANXIETY 07/01/2007  . BPH (benign prostatic hyperplasia) 07/01/2007  . HYPERLIPIDEMIA 04/10/2007  . DISORDER, DYSMETABOLIC SYNDROME X 02/63/7858  . DISORDER, DEPRESSIVE NEC 04/10/2007  . ELEVATED PROSTATE SPECIFIC ANTIGEN 04/10/2007  . History of non anemic vitamin B12 deficiency 10/26/2006  . ERECTILE DYSFUNCTION 10/26/2006  . Crohn's disease of small intestine with intestinal obstruction ([place]) 10/26/2006    Sufyaan Palma W. 09/21/2017, 1:27 PM Frazier Butt., PT  [facility] [address] [place], [place], [address] Phone: [phone]   Fax:  [phone]  Name: BEUFORD GARCILAZO MRN: [id number] Date of Birth: 03-Aug-1945

## 2017-09-24 ENCOUNTER — Encounter: Payer: Self-pay | Admitting: Physical Therapy

## 2017-09-24 ENCOUNTER — Ambulatory Visit: Payer: Medicare Other | Admitting: Physical Therapy

## 2017-09-24 DIAGNOSIS — R293 Abnormal posture: Secondary | ICD-10-CM | POA: Diagnosis not present

## 2017-09-24 DIAGNOSIS — M6281 Muscle weakness (generalized): Secondary | ICD-10-CM | POA: Diagnosis not present

## 2017-09-24 DIAGNOSIS — R2681 Unsteadiness on feet: Secondary | ICD-10-CM | POA: Diagnosis not present

## 2017-09-24 DIAGNOSIS — R29818 Other symptoms and signs involving the nervous system: Secondary | ICD-10-CM | POA: Diagnosis not present

## 2017-09-24 DIAGNOSIS — R2689 Other abnormalities of gait and mobility: Secondary | ICD-10-CM

## 2017-09-24 NOTE — Therapy (Signed)
[facility] [address] [place], [place], [address] Phone: [phone]   Fax:  [phone]  Physical Therapy Treatment  Patient Details  Name: Kyle Ball MRN: [id number] Date of Birth: [birth year] Referring Provider: Pura Spice   Encounter Date: 09/24/2017  PT End of Session - 09/24/17 2115    Visit Number  12    Number of Visits  24 per recert 06/14/22    Date for PT Re-Evaluation  11/16/17    Authorization Type  Medicare and BCBS    PT Start Time  1104    PT Stop Time  1205    PT Time Calculation (min)  61 min    Activity Tolerance  Patient tolerated treatment well    Behavior During Therapy  [facility] for tasks assessed/performed;Flat affect       Past Medical History:  Diagnosis Date  . Anxiety and depression   . BPH (benign prostatic hypertrophy)   . Crohn's disease ([place]) 1979   After resection, 1 flare in 2012.  Marland Kitchen Dysmetabolic syndrome X   . ED (erectile dysfunction)   . Hyperlipidemia   . Internal hemorrhoids   . Parkinson's disease ([place])    [facility]    Past Surgical History:  Procedure Laterality Date  . COLONOSCOPY W/ BIOPSIES   06/2007, 2015   Crohn's, internal hemorrhoids, Dr Carlean Purl  . KNEE ARTHROSCOPY Right 06/2013  . PROSTATE BIOPSY     X 2, Dr Risa Grill  . PTNS Treatment     urinary incontinence  . QUADRICEPS REPAIR Right 1966    knee  . REFRACTIVE SURGERY  2015   Dr Bing Plume  . Terminal Ileum & Appendix Resected  1995   Crohn's    There were no vitals filed for this visit.  Subjective Assessment - 09/24/17 1108    Subjective  Did my exercises 3 times over the weekend.  (Reminded pt of needing to perform exercises once on therapy days and twice on non-PT days).    Pertinent History  OSA, recent cardiac ultrasounds (awaiting results)    Patient Stated Goals  Pt's goals for therapy are to improve stooped posture, especially with walking    Currently in Pain?  Yes    Pain Score  4      Pain Location  Back    Pain Orientation  Right;Left;Lower    Pain Descriptors / Indicators  Aching;Dull    Pain Type  Chronic pain    Pain Onset  More than a month ago    Pain Frequency  Intermittent    Aggravating Factors   bending, exercises sometimes aggravate    Pain Relieving Factors  medication            LSVT ([facility]) - Maximal Daily Exercises   Floor to ceiling Other reps 8 reps-10 second hold   Side to side Other reps 8 reps-10 second hold   Step and Reach Forward 10 reps each sidewith coordinated arm swing   Step and Reach Backward 10 reps each sidewith coordinated arm swing   Step and Reach Sideways 10 reps each sidewith coordinated arm swing   Rock and Reach Forward/Backward 10 reps each side with reciprocal arm swing   Rock and Reach Sideways 10 reps each side with coordinated arm swing/reach   Functional Component Task:  1 - Sit to stand Other reps (comment) 5 reps each, from 20" mat, then from 17" chair   Task 2 Standing rocking, 5-10 reps to initiate gait  for short distance/turns, multiple reps   Hierarchy Task 1 Full 360 degree turns to R and to L, 2reps each direction, with cues for BIG Rock to turn; progress to short distance gait with rock and turn to change directions, 3 reps   HierarchyTask 2 Stepping over object (as in dog gate)-, simulating task for home, with cues for BIG effort to get close to target, then BIG step over, then rocking to initiate 2nd step BIG; practiced at least 5 reps; with additional height (to simulate tub)-practiced stepping over 2 reps-cues for BIG step over and BIG second step over to balance     Hierarchy Task 3                                                       Turning to sit in chair (in response to pt's reported difficulty with positioning when turning to sit in recliner.)  Practiced full                                                                                 turn BIG to sit "squared up" into  chair.  Repeated 2 times.   Pt needs tactile cues for modeling and shaping of daily exercises; max VCs for BIG effort.  Each exercise-pt performs several to get correct technique, then starts count.                 [facility] Adult PT Treatment/Exercise - 09/24/17 0001      Ambulation/Gait   Ambulation/Gait  Yes    Ambulation/Gait Assistance  6: Modified independent (Device/Increase time)    Ambulation/Gait Assistance Details  verbal, visual cues for best BIG posture, step length, arm swing    Ambulation Distance (Feet)  240 Feet x 3    Assistive device  None    Gait Pattern  Step-through pattern;Decreased arm swing - right;Decreased arm swing - left;Decreased step length - right;Right flexed knee in stance;Shuffle;Decreased trunk rotation;Trunk flexed;Poor foot clearance - left;Poor foot clearance - right    Ambulation Surface  Level;Indoor    Pre-Gait Activities  Cues for equal arm swing anterior/posterior, BIG reach with reciprocal arm swing                  PT Long Term Goals - 09/17/17 1315      PT LONG TERM GOAL #1   Title  Pt will be independent with Parkinson's specific HEP for improved transfers, gait and balance.  UPDATED TARGET 10/12/17    Time  4 per recert 0/1/60    Period  Weeks    Status  On-going    Target Date  10/12/17      PT LONG TERM GOAL #2   Title  Pt will improve 5x sit<>stand to less than or equal to 13 seconds  for improved transfer efficiency and safety.    Baseline  15.94 sec 09/17/17    Time  4 per recert 1/0/93    Period  Weeks    Status  On-going  Target Date  10/12/17      PT LONG TERM GOAL #3   Title  Pt will improve TUG score to less than or equal to 13.5 seconds for decreased fall risk.    Baseline  10.6    Time  4    Period  Weeks    Status  Achieved      PT LONG TERM GOAL #4   Title  Pt will improve MiniBESTest score to at least 24/28 for decreased fall risk.    Time  4 per recert 09/11/57    Period  Weeks     Status  On-going    Target Date  10/12/17      PT LONG TERM GOAL #5   Title  Pt will verbalize tips to reduce freezing episodes with gait and turns.    Time  4 per recert 10/15/36    Period  Weeks    Status  On-going    Target Date  10/12/17      PT LONG TERM GOAL #6   Title  Pt will verbalize understanding of posture/positioning/body mechanics for improved posture/decreased back pain.    Time  4 per recert 12/15/62    Period  Weeks    Status  On-going    Target Date  10/12/17      PT LONG TERM GOAL #7   Title  Pt will verbalize plans for continued community fitness upon D/C from PT.    Time  4 per recert 08/13/27    Period  Weeks    Status  On-going    Target Date  10/12/17            Plan - 09/24/17 2116    Clinical Impression Statement  Continued focus on large amplitude, high intensity movement patterns with daily exercises, functional tasks, and hierarchy tasks.  Pt appears to have improved larger and more deliberate movement patterns with maximal daily exercises.  Will continue to beneift from skilled PT to address balance, gait, and posture.    Rehab Potential  Good    PT Frequency  4x / week per recert 10/11/86    PT Duration  4 weeks    PT Treatment/Interventions  ADLs/Self Care Home Management;Gait training;Stair training;Functional mobility training;Therapeutic activities;Therapeutic exercise;Balance training;Neuromuscular re-education;Patient/family education    PT Next Visit Plan  LSVT BIG maximal daily exercises, add to HEP, functional component tasks and begin hierarchy tasks (items to address:  standing tasks-dishwashing, yardwork, carrying items, walking dog, squats to pick up objects) Provide chart to kep exercises organized    Consulted and Agree with Plan of Care  Patient       Patient will benefit from skilled therapeutic intervention in order to improve the following deficits and impairments:  Abnormal gait, Decreased balance, Decreased mobility, Decreased  strength, Difficulty walking, Impaired flexibility, Postural dysfunction, Impaired tone  Visit Diagnosis: Abnormal posture  Unsteadiness on feet  Other abnormalities of gait and mobility     Problem List Patient Active Problem List   Diagnosis Date Noted  . Medicare annual wellness visit, subsequent 12/04/2016  . Obesity 12/04/2016  . Hyperglycemia 02/04/2015  . Elevated blood pressure reading without diagnosis of hypertension 03/05/2013  . Nonspecific abnormal electrocardiogram (ECG) (EKG) 08/31/2011  . Parkinson's disease ([place]) 06/21/2010  . ANXIETY 07/01/2007  . BPH (benign prostatic hyperplasia) 07/01/2007  . HYPERLIPIDEMIA 04/10/2007  . DISORDER, DYSMETABOLIC SYNDROME X 41/66/0630  . DISORDER, DEPRESSIVE NEC 04/10/2007  . ELEVATED PROSTATE SPECIFIC ANTIGEN 04/10/2007  . History of  non anemic vitamin B12 deficiency 10/26/2006  . ERECTILE DYSFUNCTION 10/26/2006  . Crohn's disease of small intestine with intestinal obstruction ([place]) 10/26/2006    Riverlyn Kizziah W. 09/24/2017, 9:18 PM  Frazier Butt., PT   [facility] [address] [place], [place], [address] Phone: [phone]   Fax:  [phone]  Name: Kyle Ball MRN: [id number] Date of Birth: 05/23/1946

## 2017-09-25 ENCOUNTER — Ambulatory Visit: Payer: Medicare Other | Admitting: Physical Therapy

## 2017-09-26 ENCOUNTER — Telehealth: Payer: Self-pay | Admitting: Physical Therapy

## 2017-09-26 ENCOUNTER — Ambulatory Visit: Payer: Medicare Other | Admitting: Physical Therapy

## 2017-09-26 NOTE — Telephone Encounter (Signed)
Mr. Kyle Ball called yesterday and today to cancel PT appointments due to back pain from a fall on 09/24/17.  PT spoke with patient today, and he reports having fall in yard outside of garage and not being able to get up for sometime.  He notes increased back pain, making movement difficult.  PT recommended pt follow up with physician with worsened back pain and pt reports he has already reached out to physician.  PT reminded patient of next scheduled appointment on 10/09/17.  Mady Haagensen, PT 09/26/17 11:43 AM Phone: [phone] Fax: [phone]

## 2017-09-27 ENCOUNTER — Other Ambulatory Visit: Payer: Self-pay

## 2017-09-27 ENCOUNTER — Ambulatory Visit (AMBULATORY_SURGERY_CENTER): Payer: Medicare Other | Admitting: Internal Medicine

## 2017-09-27 ENCOUNTER — Encounter: Payer: Self-pay | Admitting: Internal Medicine

## 2017-09-27 VITALS — BP 116/49 | HR 66 | Temp 98.9°F | Resp 10 | Ht 74.0 in | Wt 249.0 lb

## 2017-09-27 DIAGNOSIS — K5090 Crohn's disease, unspecified, without complications: Secondary | ICD-10-CM | POA: Diagnosis not present

## 2017-09-27 DIAGNOSIS — K50012 Crohn's disease of small intestine with intestinal obstruction: Secondary | ICD-10-CM

## 2017-09-27 DIAGNOSIS — K56699 Other intestinal obstruction unspecified as to partial versus complete obstruction: Secondary | ICD-10-CM

## 2017-09-27 DIAGNOSIS — G4733 Obstructive sleep apnea (adult) (pediatric): Secondary | ICD-10-CM | POA: Diagnosis not present

## 2017-09-27 DIAGNOSIS — G20 Parkinson's disease: Secondary | ICD-10-CM | POA: Diagnosis not present

## 2017-09-27 DIAGNOSIS — K529 Noninfective gastroenteritis and colitis, unspecified: Secondary | ICD-10-CM | POA: Diagnosis not present

## 2017-09-27 MED ORDER — SODIUM CHLORIDE 0.9 % IV SOLN: 500.0000 mL | Freq: Once | INTRAVENOUS | Status: DC

## 2017-09-27 NOTE — Op Note (Signed)
[facility] Patient Name: Kyle Ball Procedure Date: 09/27/2017 3:54 PM MRN: [id number] Endoscopist: Gatha Mayer , MD Age: 72 Referring MD:  Date of Birth: 09-23-1945 Gender: Male Account #: [id number] Procedure:                Colonoscopy Indications:              Therapeutic procedure, Crohn's disease of the small                            bowel, For therapy of Crohn's disease of the small                            bowel Medicines:                Propofol per Anesthesia, Monitored Anesthesia Care Procedure:                Pre-Anesthesia Assessment:                           - Prior to the procedure, a History and Physical                            was performed, and patient medications and                            allergies were reviewed. The patient's tolerance of                            previous anesthesia was also reviewed. The risks                            and benefits of the procedure and the sedation                            options and risks were discussed with the patient.                            All questions were answered, and informed consent                            was obtained. Prior Anticoagulants: The patient has                            taken no previous anticoagulant or antiplatelet                            agents. ASA Grade Assessment: III - A patient with                            severe systemic disease. After reviewing the risks                            and benefits, the patient was deemed in  satisfactory condition to undergo the procedure.                           After obtaining informed consent, the colonoscope                            was passed under direct vision. Throughout the                            procedure, the patient's blood pressure, pulse, and                            oxygen saturations were monitored continuously. The                            Colonoscope was  introduced through the anus and                            advanced to the the ileocolonic anastomosis. The                            colonoscopy was performed without difficulty. The                            patient tolerated the procedure well. The quality                            of the bowel preparation was good. The bowel                            preparation used was Miralax. The rectum and                            Ileocolonic anastomsis areas were photographed. Scope In: 4:07:12 PM Scope Out: 4:28:49 PM Scope Withdrawal Time: 0 hours 18 minutes 9 seconds  Total Procedure Duration: 0 hours 21 minutes 37 seconds  Findings:                 The perianal examination was normal.                           The digital rectal exam findings include enlarged                            prostate. Pertinent negatives include no palpable                            rectal lesions.                           There was evidence of a prior end-to-side                            ileo-colonic anastomosis in the ascending colon.  This was patent and was characterized by moderate                            stenosis. The anastomosis was not traversed.                            Biopsies were taken with a cold forceps for                            histology. Verification of patient identification                            for the specimen was done. Estimated blood loss was                            minimal. A TTS dilator was passed through the                            scope. Dilation with an 8.5-9.5-10.5 mm and an                            04-23-12 mm colonic balloon dilator was performed.                            The dilation site was examined and showed moderate                            improvement in luminal narrowing. Estimated blood                            loss was minimal.                           The exam was otherwise without abnormality on                             direct and retroflexion views. Complications:            No immediate complications. Estimated Blood Loss:     Estimated blood loss was minimal. Impression:               - Enlarged prostate found on digital rectal exam.                           - Patent end-to-side ileo-colonic anastomosis,                            characterized by moderate stenosis. Biopsied.                            Dilated.                           - The examination was otherwise normal on direct  and retroflexion views. Recommendation:           - Patient has a contact number available for                            emergencies. The signs and symptoms of potential                            delayed complications were discussed with the                            patient. Return to normal activities tomorrow.                            Written discharge instructions were provided to the                            patient.                           - Resume previous diet.                           - Continue present medications.                           - Await pathology results.                           - Optiions for next steps are to start biologic Tx,                            surgery possible also - will discuss again. he has                            responded to dilation last year w/ less diarrhea                            problems. Budesonide has also seemed to help. Gatha Mayer, MD 09/27/2017 4:43:17 PM This report has been signed electronically.

## 2017-09-27 NOTE — Progress Notes (Signed)
Report to PACU, RN, vss, BBS= Clear.  

## 2017-09-27 NOTE — Progress Notes (Signed)
Called to room to assist during endoscopic procedure.  Patient ID and intended procedure confirmed with present staff. Received instructions for my participation in the procedure from the performing physician.  

## 2017-09-27 NOTE — Progress Notes (Signed)
Pt's states no medical or surgical changes since previsit or office visit. 

## 2017-09-27 NOTE — Patient Instructions (Addendum)
I dilated and biopsied the stricture again.  I hope this makes a difference.  The next step I/we anticipated was starting medication for Crohn's called Cimzia.  The idea was to prevent the inflammation from recurring and causing the stricture.  Another option would be to remove the stricture with surgery - then you would probably not need medication.  I will contact you about the biopsy results and we will go forward from there.  I appreciate the opportunity to care for you. Gatha Mayer, MD, FACG   YOU HAD AN ENDOSCOPIC PROCEDURE TODAY AT [facility]:   Refer to the procedure report that was given to you for any specific questions about what was found during the examination.  If the procedure report does not answer your questions, please call your gastroenterologist to clarify.  If you requested that your care partner not be given the details of your procedure findings, then the procedure report has been included in a sealed envelope for you to review at your convenience later.  YOU SHOULD EXPECT: Some feelings of bloating in the abdomen. Passage of more gas than usual.  Walking can help get rid of the air that was put into your GI tract during the procedure and reduce the bloating. If you had a lower endoscopy (such as a colonoscopy or flexible sigmoidoscopy) you may notice spotting of blood in your stool or on the toilet paper. If you underwent a bowel prep for your procedure, you may not have a normal bowel movement for a few days.  Please Note:  You might notice some irritation and congestion in your nose or some drainage.  This is from the oxygen used during your procedure.  There is no need for concern and it should clear up in a day or so.  SYMPTOMS TO REPORT IMMEDIATELY:   Following lower endoscopy (colonoscopy or flexible sigmoidoscopy):  Excessive amounts of blood in the stool  Significant tenderness or worsening of abdominal pains  Swelling of the  abdomen that is new, acute  Fever of 100F or higher  Black, tarry-looking stools  For urgent or emergent issues, a gastroenterologist can be reached at any hour by calling [phone].   DIET:  We do recommend a small meal at first, but then you may proceed to your regular diet.  Drink plenty of fluids but you should avoid alcoholic beverages for 24 hours.  ACTIVITY:  You should plan to take it easy for the rest of today and you should NOT DRIVE or use heavy machinery until tomorrow (because of the sedation medicines used during the test).    FOLLOW UP: Our staff will call the number listed on your records the next business day following your procedure to check on you and address any questions or concerns that you may have regarding the information given to you following your procedure. If we do not reach you, we will leave a message.  However, if you are feeling well and you are not experiencing any problems, there is no need to return our call.  We will assume that you have returned to your regular daily activities without incident.  If any biopsies were taken you will be contacted by phone or by letter within the next 1-3 weeks.  Please call us at [phone] if you have not heard about the biopsies in 3 weeks.    SIGNATURES/CONFIDENTIALITY: You and/or your care partner have signed paperwork which will be entered into your electronic medical  record.  These signatures attest to the fact that that the information above on your After Visit Summary has been reviewed and is understood.  Full responsibility of the confidentiality of this discharge information lies with you and/or your care-partner.

## 2017-10-01 ENCOUNTER — Telehealth: Payer: Self-pay | Admitting: *Deleted

## 2017-10-01 NOTE — Telephone Encounter (Signed)
  Follow up Call-  Call back number 09/27/2017 12/19/2016  Post procedure Call Back phone  # [phone]   [phone]  Permission to leave phone message Yes Yes  Some recent data might be hidden     Patient questions:  Do you have a fever, pain , or abdominal swelling? No. Pain Score  0 *  Have you tolerated food without any problems? Yes.    Have you been able to return to your normal activities? Yes.    Do you have any questions about your discharge instructions: Diet   No. Medications  No. Follow up visit  No.  Do you have questions or concerns about your Care? No.  Actions: * If pain score is 4 or above: No action needed, pain <4.  Spoke with patients wife.  She states he is doing fine.

## 2017-10-07 NOTE — Progress Notes (Signed)
[facility] - no letter orrecall  Office  Call patient/wife  Please get a symptom update re: diarrhea/constipation/abdominal pain/incontinence  We were planning on Trying Cimzia in office on this patient and if they are ready to proceed (labs are done) we should start  Let me know results of call  Also needs a next available f/u

## 2017-10-08 ENCOUNTER — Ambulatory Visit: Payer: Medicare Other | Admitting: Physical Therapy

## 2017-10-08 NOTE — Telephone Encounter (Addendum)
Pt reports that several days after his procedure he developed a pain at his right elbow.  He had an unsuccessful attempt at an IV on his visit on 4/19 in his right hand.  He describes the pain as sharp "about a foot up from the IV site" and happens when he straightens his arm.  He has had no pain, swelling or redness at the IV site.  He asks if this could be related to the IV.  I advised him that I thought it would be unlikely.  He agrees to watch it for a few more days and he will call back if symptoms worsen or do not improve.

## 2017-10-08 NOTE — Telephone Encounter (Signed)
Pt called stating that has been having strong pain in his right arm where the IV was. Pls call him.

## 2017-10-09 ENCOUNTER — Ambulatory Visit (INDEPENDENT_AMBULATORY_CARE_PROVIDER_SITE_OTHER): Payer: Medicare Other | Admitting: Family Medicine

## 2017-10-09 ENCOUNTER — Encounter: Payer: Self-pay | Admitting: Physical Therapy

## 2017-10-09 ENCOUNTER — Encounter: Payer: Self-pay | Admitting: Family Medicine

## 2017-10-09 ENCOUNTER — Ambulatory Visit: Payer: Medicare Other | Admitting: Physical Therapy

## 2017-10-09 VITALS — BP 132/64 | HR 77 | Temp 98.5°F | Ht 71.0 in | Wt 238.0 lb

## 2017-10-09 DIAGNOSIS — R29818 Other symptoms and signs involving the nervous system: Secondary | ICD-10-CM | POA: Diagnosis not present

## 2017-10-09 DIAGNOSIS — M545 Low back pain, unspecified: Secondary | ICD-10-CM

## 2017-10-09 DIAGNOSIS — R2681 Unsteadiness on feet: Secondary | ICD-10-CM | POA: Diagnosis not present

## 2017-10-09 DIAGNOSIS — K50012 Crohn's disease of small intestine with intestinal obstruction: Secondary | ICD-10-CM

## 2017-10-09 DIAGNOSIS — R2689 Other abnormalities of gait and mobility: Secondary | ICD-10-CM

## 2017-10-09 DIAGNOSIS — G20 Parkinson's disease: Secondary | ICD-10-CM | POA: Diagnosis not present

## 2017-10-09 DIAGNOSIS — Z8639 Personal history of other endocrine, nutritional and metabolic disease: Secondary | ICD-10-CM

## 2017-10-09 DIAGNOSIS — R293 Abnormal posture: Secondary | ICD-10-CM

## 2017-10-09 DIAGNOSIS — M6281 Muscle weakness (generalized): Secondary | ICD-10-CM | POA: Diagnosis not present

## 2017-10-09 MED ORDER — IBUPROFEN 800 MG PO TABS: 800.0000 mg | ORAL_TABLET | Freq: Three times a day (TID) | ORAL | 1 refills | Status: DC | PRN

## 2017-10-09 MED ORDER — CYANOCOBALAMIN 1000 MCG/ML IJ SOLN
1000.0000 ug | Freq: Once | INTRAMUSCULAR | Status: AC
  Administered 2017-10-09: 1000 ug via INTRAMUSCULAR

## 2017-10-09 NOTE — Therapy (Signed)
[facility] [address] [place], [place], [address] Phone: [phone]   Fax:  [phone]  Physical Therapy Treatment  Patient Details  Name: Kyle Ball MRN: [id number] Date of Birth: 23-May-1946 Referring Provider: Pura Spice   Encounter Date: 10/09/2017  PT End of Session - 10/09/17 1602    Visit Number  13    Number of Visits  24 per recert 4/0/81    Date for PT Re-Evaluation  11/16/17    Authorization Type  Medicare and BCBS    PT Start Time  1152    PT Stop Time  1250    PT Time Calculation (min)  58 min    Activity Tolerance  Patient tolerated treatment well    Behavior During Therapy  [facility] for tasks assessed/performed;Flat affect       Past Medical History:  Diagnosis Date  . Anxiety and depression   . BPH (benign prostatic hypertrophy)   . Crohn's disease ([place]) 1979   After resection, 1 flare in 2012.  Marland Kitchen Dysmetabolic syndrome X   . ED (erectile dysfunction)   . Hyperlipidemia   . Internal hemorrhoids   . Parkinson's disease ([place])    [facility]    Past Surgical History:  Procedure Laterality Date  . COLONOSCOPY W/ BIOPSIES   06/2007, 2015   Crohn's, internal hemorrhoids, Dr Carlean Purl  . KNEE ARTHROSCOPY Right 06/2013  . PROSTATE BIOPSY     X 2, Dr Risa Grill  . PTNS Treatment     urinary incontinence  . QUADRICEPS REPAIR Right 1966    knee  . REFRACTIVE SURGERY  2015   Dr Bing Plume  . Terminal Ileum & Appendix Resected  1995   Crohn's    There were no vitals filed for this visit.  Subjective Assessment - 10/09/17 1155    Subjective  Back pain as a result of fall in yard (2 weeks ago) is getting back to baseline.  It was so bad that I couldn't move.  I have a call in to orthopedist.  Generally the pain is worse when I bend to strain, and then it gets better when I sit.    Pertinent History  OSA, recent cardiac ultrasounds (awaiting results)    Patient Stated Goals  Pt's goals for therapy  are to improve stooped posture, especially with walking    Currently in Pain?  Yes    Pain Score  6     Pain Location  Back    Pain Orientation  Right;Left    Pain Descriptors / Indicators  Aching    Pain Type  Chronic pain Acute pain from 2 weeks ago due to fall    Pain Onset  More than a month ago    Pain Frequency  Intermittent    Aggravating Factors   bending, straining movements    Pain Relieving Factors  sitting to rest, medication           LSVT ([facility]) -Maximal Daily Exercises   Floor to ceiling Other reps 8 reps-10 second hold   Side to side Other reps 8 reps-10 second hold   Step and Reach Forward 10 reps each sidewith coordinated arm movements   Step and Reach Backward 10 reps each sidewith coordinated arm movements   Step and Reach Sideways 10 reps each sidewith coordinated arm movements   Rock and Reach Forward/Backward 10 reps each side with reciprocal arm swing   Rock and Reach Sideways 10 reps each side with coordinated  arm swing/reach   Functional Component Task:1 - Sit to stand Other reps (comment) 5 reps each, from 20" mat,then from 17" chairwith arms forward for momentum.  In response to pt's question about restaurants, pt practices x 5 reps no forward arms for momentum, but rather pushes to "boost" BIG up from chair, with cues   Task2 Standing rocking,5-10 reps to initiate gait for short distance/turns, multiple reps   HierarchyTask 1 Full 360 degree turns to R and to L,2reps each direction, with cues for BIG Rock to turn; progress to short distance gait with rock and turn to change directions, 3 reps   HierarchyTask 2 Stepping over object (as in dog gate)-, simulating task for home, with cues for BIG effortto get close to target, then BIG step over, then rocking to initiate 2nd step BIG; practiced at least 5 reps; with additional height (to simulate tub)-practiced stepping over 2 reps-cues for BIG step over and BIG second step  over to balance-progressed from using UE support at counter, to no UE support    Hierarchy Task 3Turning to sit in chair (in response to pt's reported difficulty with positioning when turning to sit in recliner.) Practiced full  turn BIG to sit "squared up" into chair. Repeated 2 times., then third time at end of session, pt needing cueing to prevent        from prematurely sitting.   Pt needs max verbal and occasional tactile cues for modeling and shaping of daily exercises; max VCs for BIG effort. Each exercise-pt performs several to get correct technique, then starts count.                          [facility] PT Treatment/Exercise - 10/09/17 0001      Ambulation/Gait   Ambulation/Gait  Yes    Ambulation/Gait Assistance  6: Modified independent (Device/Increase time)    Ambulation/Gait Assistance Details  Verbal and tactile cues for posture; attempted use of bilateral walking poles to facilitate reciprocal arm swing; pt has significantly forward flexed posture with gait.  Even with cues for BIG effort to increase step length and arm swing, pt has difficulty with reciprocal arm swing.    Ambulation Distance (Feet)  240 Feet 60 ft x 6 reps, then 360    Assistive device  None use of bilateral walking poles to facilitate arm swing    Gait Pattern  Step-through pattern;Decreased arm swing - right;Decreased arm swing - left;Decreased step length - right;Right flexed knee in stance;Shuffle;Decreased trunk rotation;Trunk flexed;Poor foot clearance - left;Poor foot clearance - right    Ambulation Surface  Level;Indoor                  PT Long Term Goals - 09/17/17 1315      PT LONG TERM GOAL #1   Title  Pt will be independent with Parkinson's specific HEP for improved transfers, gait and balance.  UPDATED TARGET 10/12/17     Time  4 per recert 01/12/37    Period  Weeks    Status  On-going    Target Date  10/12/17      PT LONG TERM GOAL #2   Title  Pt will improve 5x sit<>stand to less than or equal to 13 seconds  for improved transfer efficiency and safety.    Baseline  15.94 sec 09/17/17    Time  4 per recert 07/18/03    Period  Weeks    Status  On-going  Target Date  10/12/17      PT LONG TERM GOAL #3   Title  Pt will improve TUG score to less than or equal to 13.5 seconds for decreased fall risk.    Baseline  10.6    Time  4    Period  Weeks    Status  Achieved      PT LONG TERM GOAL #4   Title  Pt will improve MiniBESTest score to at least 24/28 for decreased fall risk.    Time  4 per recert 10/18/50    Period  Weeks    Status  On-going    Target Date  10/12/17      PT LONG TERM GOAL #5   Title  Pt will verbalize tips to reduce freezing episodes with gait and turns.    Time  4 per recert 12/16/80    Period  Weeks    Status  On-going    Target Date  10/12/17      PT LONG TERM GOAL #6   Title  Pt will verbalize understanding of posture/positioning/body mechanics for improved posture/decreased back pain.    Time  4 per recert 09/11/33    Period  Weeks    Status  On-going    Target Date  10/12/17      PT LONG TERM GOAL #7   Title  Pt will verbalize plans for continued community fitness upon D/C from PT.    Time  4 per recert 08/15/12    Period  Weeks    Status  On-going    Target Date  10/12/17            Plan - 10/09/17 1603    Clinical Impression Statement  Pt missed one visit the week of 09/24/17 due to back pain from fall and was not seen last week due to therapist out of clinic.  Yesterday, pt arrived>15 minutes late for appointment and was not seen.  Had frank conversation yesterday with patient about commitment to LSVT BIG program.  Pt is willing to participate, and pt continues to require maximal cues for BIG effort with exercises and movement patterns in therapy, with little change in  posture or apparent effort on pt's part.  He verbalized frustration about posture and asks questions about best exercises to do.  PT reiterates he needs to continue to follow LSVT BIG protocol with exercises.  Pt's decreased awareness to exercise program an d movement patterns in general appears slow to improve.    Rehab Potential  Good    PT Frequency  4x / week per recert 09/12/13    PT Duration  4 weeks    PT Treatment/Interventions  ADLs/Self Care Home Management;Gait training;Stair training;Functional mobility training;Therapeutic activities;Therapeutic exercise;Balance training;Neuromuscular re-education;Patient/family education    PT Next Visit Plan  LSVT BIG maximal daily exercises, add to HEP, functional component tasks and begin hierarchy tasks (items to address:  standing tasks-dishwashing, yardwork, carrying items, walking dog, squats to pick up objects) Provide chart to kep exercises organized/ask wife to come in to improve compliance    Consulted and Agree with Plan of Care  Patient       Patient will benefit from skilled therapeutic intervention in order to improve the following deficits and impairments:  Abnormal gait, Decreased balance, Decreased mobility, Decreased strength, Difficulty walking, Impaired flexibility, Postural dysfunction, Impaired tone  Visit Diagnosis: Abnormal posture  Other abnormalities of gait and mobility  Other symptoms and signs involving the nervous system  Problem List Patient Active Problem List   Diagnosis Date Noted  . Medicare annual wellness visit, subsequent 12/04/2016  . Obesity 12/04/2016  . Hyperglycemia 02/04/2015  . Elevated blood pressure reading without diagnosis of hypertension 03/05/2013  . Nonspecific abnormal electrocardiogram (ECG) (EKG) 08/31/2011  . Parkinson's disease ([place]) 06/21/2010  . ANXIETY 07/01/2007  . BPH (benign prostatic hyperplasia) 07/01/2007  . HYPERLIPIDEMIA 04/10/2007  . DISORDER, DYSMETABOLIC SYNDROME  X 86/75/4492  . DISORDER, DEPRESSIVE NEC 04/10/2007  . ELEVATED PROSTATE SPECIFIC ANTIGEN 04/10/2007  . History of non anemic vitamin B12 deficiency 10/26/2006  . ERECTILE DYSFUNCTION 10/26/2006  . Crohn's disease of small intestine with intestinal obstruction ([place]) 10/26/2006    MARRIOTT,AMY W. 10/09/2017, 4:14 PM  Frazier Butt., PT   [facility] [address] [place], [place], [address] Phone: [phone]   Fax:  [phone]  Name: Kyle Ball MRN: [id number] Date of Birth: [birth year]

## 2017-10-09 NOTE — Progress Notes (Signed)
Subjective:    Patient ID: Kyle Ball, male    DOB: 10-Apr-1946, 72 y.o.   MRN: [id number]  Chief Complaint  Patient presents with  . Follow-up    HPI Patient was seen today for transfer of care.  Pt was previously seen at [facility] clinic by Mauricio Po, NP.   Pt is followed by numerous specialist:  [facility]- Dr. Alinda Money   Neruology- Dr. Eldridge Abrahams   Gastroenterology- Dr. Silvano Rusk   PT- Barry Brunner  Sleep Med- Dr. Dwyane Dee  Parkinson's disease: -Patient is followed by Dr. Deboraha Sprang of [facility] -Patient was diagnosed 4 years ago -Initially he was started on Requip which made him sleepy. -Patient is now on carbidopa levodopa 25-100 mg 3 times daily -Patient also does a Parkinson's exercise program.  Crohn's disease: -Patient was diagnosed in October 1979. -In the past he had flares every 6 months which then leveled out to 1/year. -Patient is now experiencing flares every 4 to 5 days and symptoms around 7 AM in the morning -Patient had surgery in 1988 for a terminal ileum and cecum resection. This was done in [place] -Patient states he had a colonoscopy last Thursday by Dr. Arelia Longest to stretch structure at the site of his previous surgery. 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 -Patient states he was told he would have arthritis years ago. -Patient endorses arthritis.  He also notes popping.  At times wears a knee brace -Currently doing physical therapy 4 days/week  Low back pain, acute: -Pt endorses back strain off and on the last few months -Pt endorses noting symptoms when he bends down to change the [place] -Pt does not bend down at the knees but bends over at the hips to pick up the box -Pt has tried ibuprofen 800 mg for this which has helped.  Pt has been getting ibuprofen from his wife who had a prescription written by her son who  is a hospitalist in [place]. -pt thought about using heat, but was unsure  H/o B12 def. -2/2 Crohn's disease s/p ilium and cecum resection  -was on B12 injections in the past. -is interested in restarting injections. -has B12 checked 08/22/17 was 223  OSA: -pt had a sleep study.  Pt endorses being told he snores at night.  Pt denies much day time somnolence. -pt endorses issues with insurance in regards to whether he needed a CPAP vs BiPAP. -it was determined he would benefit from BiPAP, but it has yet to be delivered. -pt has an appt with pulmonology next wk, but is going to cancel it as he was suppose to f/u after using the BiPAP for 2 months.  Allergies: PCN--rash   Past Medical History:  Diagnosis Date  . Anxiety and depression   . BPH (benign prostatic hypertrophy)   . Crohn's disease ([place]) 1979   After resection, 1 flare in 2012.  Marland Kitchen Dysmetabolic syndrome X   . ED (erectile dysfunction)   . Hyperlipidemia   . Internal hemorrhoids   . Parkinson's disease ([place])    [facility]    Allergies  Allergen Reactions  . Penicillins     Rash age 52 with PCN injection. Because of a history of documented adverse serious drug reaction;Medi Alert bracelet  is recommended    ROS General: Denies fever, chills, night sweats, changes in weight, changes in appetite HEENT: Denies headaches, ear  pain, changes in vision, rhinorrhea, sore throat CV: Denies CP, palpitations, SOB, orthopnea Pulm: Denies SOB, cough, wheezing GI: Denies abdominal pain, nausea, vomiting, diarrhea, constipation  +abdominal pain, h/o crohn's dz GU: Denies dysuria, hematuria, frequency, vaginal discharge Msk: Denies muscle cramps, joint pains  +R knee pain, low back pain Neuro: Denies weakness, numbness, tingling  +tremor Skin: Denies rashes, bruising Psych: Denies depression, anxiety, hallucinations     Objective:    Blood pressure 132/64, pulse 77, temperature 98.5 F (36.9 C), temperature source Oral, height  5\' 11"  (1.803 m), weight 238 lb (108 kg), SpO2 96 %.   Gen. Pleasant, well-nourished, in no distress, normal affect   HEENT: /AT, face symmetric, no scleral icterus, PERRLA,  nares patent without drainage, pharynx without erythema or exudate. Neck: No JVD, no thyromegaly, no carotid bruits Lungs: no accessory muscle use, CTAB, no wheezes or rales Cardiovascular: RRR, no m/r/g, no peripheral edema Abdomen: BS present, soft, NT/ND. Musculoskeletal: No deformities, no cyanosis or clubbing, normal tone.  Sitting and standing hunched over at the shoulders.   Neuro:  A&Ox3, CN II-XII intact, slow gait, slowed speech, resting tremor   Wt Readings from Last 3 Encounters:  10/09/17 238 lb (108 kg)  09/27/17 249 lb (112.9 kg)  08/22/17 249 lb (112.9 kg)    Lab Results  Component Value Date   WBC 8.0 08/22/2017   HGB 15.2 08/22/2017   HCT 46.6 08/22/2017   PLT 191.0 08/22/2017   GLUCOSE 104 (H) 08/22/2017   CHOL 211 (H) 01/03/2017   TRIG 159.0 (H) 01/03/2017   HDL 36.40 (L) 01/03/2017   LDLDIRECT 102.0 02/26/2015   LDLCALC 143 (H) 01/03/2017   ALT 18 08/22/2017   AST 10 08/22/2017   NA 139 08/22/2017   K 3.9 08/22/2017   CL 103 08/22/2017   CREATININE 0.75 08/22/2017   BUN 18 08/22/2017   CO2 31 08/22/2017   TSH 0.62 03/09/2016   PSA 2.17 04/10/2007   HGBA1C 5.4 02/26/2015    Assessment/Plan:  Parkinson's disease ([place]) -Continue carbidopa levodopa 25-100 mg 3 times daily -management per Neurology. -continue f/u with Dr. Linus Mako -Continue physical therapy  Crohn's disease of small intestine with intestinal obstruction ([place]) -Continue follow-up with Dr. Carlean Purl -Continue prednisone as needed for flare  History of non anemic vitamin B12 deficiency  -B12  223 on 08/22/2017 - Plan: cyanocobalamin ((VITAMIN B-12)) injection 1,000 mcg -Patient can schedule nurses visit every month for continued B12 injections.   -Plan to recheck B12 level in several months.  Will place  orders.  Acute left-sided low back pain without sciatica  -Discussed proper lifting techniques, though difficult for patient to do -Given handout -Discussed heat, ice, massage, NSAIDs as needed -Patient can work on back exercises at physical therapy. - Plan: ibuprofen (ADVIL,MOTRIN) 800 MG tablet  Follow-up in the next few months, sooner if needed. Patient to schedule nurses visit in 1 month for next vitamin B12 injection.  Grier Mitts, MD

## 2017-10-09 NOTE — Patient Instructions (Signed)
Back Pain, Adult Many adults have back pain from time to time. Common causes of back pain include:  A strained muscle or ligament.  Wear and tear (degeneration) of the spinal disks.  Arthritis.  A hit to the back.  Back pain can be short-lived (acute) or last a long time (chronic). A physical exam, lab tests, and imaging studies may be done to find the cause of your pain. Follow these instructions at home: Managing pain and stiffness  Take over-the-counter and prescription medicines only as told by your health care provider.  If directed, apply heat to the affected area as often as told by your health care provider. Use the heat source that your health care provider recommends, such as a moist heat pack or a heating pad. ? Place a towel between your skin and the heat source. ? Leave the heat on for 20-30 minutes. ? Remove the heat if your skin turns bright red. This is especially important if you are unable to feel pain, heat, or cold. You have a greater risk of getting burned.  If directed, apply ice to the injured area: ? Put ice in a plastic bag. ? Place a towel between your skin and the bag. ? Leave the ice on for 20 minutes, 2-3 times a day for the first 2-3 days. Activity  Do not stay in bed. Resting more than 1-2 days can delay your recovery.  Take short walks on even surfaces as soon as you are able. Try to increase the length of time you walk each day.  Do not sit, drive, or stand in one place for more than 30 minutes at a time. Sitting or standing for long periods of time can put stress on your back.  Use proper lifting techniques. When you bend and lift, use positions that put less stress on your back: ? [place] your knees. ? Keep the load close to your body. ? Avoid twisting.  Exercise regularly as told by your health care provider. Exercising will help your back heal faster. This also helps prevent back injuries by keeping muscles strong and flexible.  Your health  care provider may recommend that you see a physical therapist. This person can help you come up with a safe exercise program. Do any exercises as told by your physical therapist. Lifestyle  Maintain a healthy weight. Extra weight puts stress on your back and makes it difficult to have good posture.  Avoid activities or situations that make you feel anxious or stressed. Learn ways to manage anxiety and stress. One way to manage stress is through exercise. Stress and anxiety increase muscle tension and can make back pain worse. General instructions  Sleep on a firm mattress in a comfortable position. Try lying on your side with your knees slightly bent. If you lie on your back, put a pillow under your knees.  Follow your treatment plan as told by your health care provider. This may include: ? Cognitive or behavioral therapy. ? Acupuncture or massage therapy. ? Meditation or yoga. Contact a health care provider if:  You have pain that is not relieved with rest or medicine.  You have increasing pain going down into your legs or buttocks.  Your pain does not improve in 2 weeks.  You have pain at night.  You lose weight.  You have a fever or chills. Get help right away if:  You develop new bowel or bladder control problems.  You have unusual weakness or numbness in your arms  or legs.  You develop nausea or vomiting.  You develop abdominal pain.  You feel faint. Summary  Many adults have back pain from time to time. A physical exam, lab tests, and imaging studies may be done to find the cause of your pain.  Use proper lifting techniques. When you bend and lift, use positions that put less stress on your back.  Take over-the-counter and prescription medicines and apply heat or ice as directed by your health care provider. This information is not intended to replace advice given to you by your health care provider. Make sure you discuss any questions you have with your health care  provider. Document Released: 05/29/2005 Document Revised: 07/03/2016 Document Reviewed: 07/03/2016 Elsevier Interactive Patient Education  2018 [employer] Injury Prevention Back injuries can be very painful. They can also be difficult to heal. After having one back injury, you are more likely to injure your back again. It is important to learn how to avoid injuring or re-injuring your back. The following tips can help you to prevent a back injury. What should I know about physical fitness?  Exercise for 30 minutes per day on most days of the week or as told by your doctor. Make sure to: ? Do aerobic exercises, such as walking, jogging, biking, or swimming. ? Do exercises that increase balance and strength, such as tai chi and yoga. ? Do stretching exercises. This helps with flexibility. ? Try to develop strong belly (abdominal) muscles. Your belly muscles help to support your back.  Stay at a healthy weight. This helps to decrease your risk of a back injury. What should I know about my diet?  Talk with your doctor about your overall diet. Take supplements and vitamins only as told by your doctor.  Talk with your doctor about how much calcium and vitamin D you need each day. These nutrients help to prevent weakening of the bones (osteoporosis).  Include good sources of calcium in your diet, such as: ? Dairy products. ? Green leafy vegetables. ? Products that have had calcium added to them (fortified).  Include good sources of vitamin D in your diet, such as: ? Milk. ? Foods that have had vitamin D added to them. What should I know about my posture?  Sit up straight and stand up straight. Avoid leaning forward when you sit or hunching over when you stand.  Choose chairs that have good low-back (lumbar) support.  If you work at a desk, sit close to it so you do not need to lean over. Keep your chin tucked in. Keep your neck drawn back. Keep your elbows bent so your arms look  like the letter "L" (right angle).  Sit high and close to the steering wheel when you drive. Add a low-back support to your car seat, if needed.  Avoid sitting or standing in one position for very long. Take breaks to get up, stretch, and walk around at least one time every hour. Take breaks every hour if you are driving for long periods of time.  Sleep on your side with your knees slightly bent, or sleep on your back with a pillow under your knees. Do not lie on the front of your body to sleep. What should I know about lifting, twisting, and reaching? Lifting and Heavy Lifting   Avoid heavy lifting, especially lifting over and over again. If you must do heavy lifting: ? Stretch before lifting. ? Work slowly. ? Rest between lifts. ? Use a  tool such as a cart or a dolly to move objects if one is available. ? Make several small trips instead of carrying one heavy load. ? Ask for help when you need it, especially when moving big objects.  Follow these steps when lifting: ? Stand with your feet shoulder-width apart. ? Get as close to the object as you can. Do not pick up a heavy object that is far from your body. ? Use handles or lifting straps if they are available. ? Bend at your knees. Squat down, but keep your heels off the floor. ? Keep your shoulders back. Keep your chin tucked in. Keep your back straight. ? Lift the object slowly while you tighten the muscles in your legs, belly, and butt. Keep the object as close to the center of your body as possible.  Follow these steps when putting down a heavy load: ? Stand with your feet shoulder-width apart. ? Lower the object slowly while you tighten the muscles in your legs, belly, and butt. Keep the object as close to the center of your body as possible. ? Keep your shoulders back. Keep your chin tucked in. Keep your back straight. ? Bend at your knees. Squat down, but keep your heels off the floor. ? Use handles or lifting straps if they  are available. Twisting and Reaching  Avoid lifting heavy objects above your waist.  Do not twist at your waist while you are lifting or carrying a load. If you need to turn, move your feet.  Do not bend over without bending at your knees.  Avoid reaching over your head, across a table, or for an object on a high surface. What are some other tips?  Avoid wet floors and icy ground. Keep sidewalks clear of ice to prevent falls.  Do not sleep on a mattress that is too soft or too hard.  Keep items that you use often within easy reach.  Put heavier objects on shelves at waist level, and put lighter objects on lower or higher shelves.  Find ways to lower your stress, such as: ? Exercise. ? Massage. ? Relaxation techniques.  Talk with your doctor if you feel anxious or depressed. These conditions can make back pain worse.  Wear flat heel shoes with cushioned soles.  Avoid making quick (sudden) movements.  Use both shoulder straps when carrying a backpack.  Do not use any tobacco products, including cigarettes, chewing tobacco, or electronic cigarettes. If you need help quitting, ask your doctor. This information is not intended to replace advice given to you by your health care provider. Make sure you discuss any questions you have with your health care provider. Document Released: 11/15/2007 Document Revised: 11/04/2015 Document Reviewed: 06/02/2014 Elsevier Interactive Patient Education  [employer].

## 2017-10-09 NOTE — Progress Notes (Signed)
Please go ahead and set up a July f/u with me

## 2017-10-10 ENCOUNTER — Ambulatory Visit: Payer: Medicare Other | Attending: Nurse Practitioner | Admitting: Physical Therapy

## 2017-10-10 ENCOUNTER — Ambulatory Visit: Payer: Medicare Other | Admitting: Physical Therapy

## 2017-10-10 ENCOUNTER — Encounter: Payer: Self-pay | Admitting: Physical Therapy

## 2017-10-10 DIAGNOSIS — R293 Abnormal posture: Secondary | ICD-10-CM | POA: Diagnosis not present

## 2017-10-10 DIAGNOSIS — R29818 Other symptoms and signs involving the nervous system: Secondary | ICD-10-CM | POA: Insufficient documentation

## 2017-10-10 DIAGNOSIS — R2689 Other abnormalities of gait and mobility: Secondary | ICD-10-CM | POA: Diagnosis not present

## 2017-10-10 DIAGNOSIS — R2681 Unsteadiness on feet: Secondary | ICD-10-CM | POA: Diagnosis not present

## 2017-10-10 NOTE — Therapy (Signed)
[facility] [address] [place], [place], [address] Phone: [phone]   Fax:  [phone]  Physical Therapy Treatment  Patient Details  Name: Kyle Ball MRN: [id number] Date of Birth: November 29, 1945 Referring Provider: Pura Spice   Encounter Date: 10/10/2017  PT End of Session - 10/10/17 1047    Visit Number  14    Number of Visits  24 per recert 8/0/99    Date for PT Re-Evaluation  11/16/17    Authorization Type  Medicare and BCBS    PT Start Time  0834    PT Stop Time  0934    PT Time Calculation (min)  60 min    Activity Tolerance  Patient tolerated treatment well    Behavior During Therapy  [facility] for tasks assessed/performed;Flat affect       Past Medical History:  Diagnosis Date  . Anxiety and depression   . BPH (benign prostatic hypertrophy)   . Crohn's disease ([place]) 1979   After resection, 1 flare in 2012.  [facility] Kitchen Dysmetabolic syndrome X   . ED (erectile dysfunction)   . Hyperlipidemia   . Internal hemorrhoids   . Parkinson's disease ([place])    [facility]    Past Surgical History:  Procedure Laterality Date  . COLONOSCOPY W/ BIOPSIES   06/2007, 2015   Crohn's, internal hemorrhoids, Dr Carlean Purl  . KNEE ARTHROSCOPY Right 06/2013  . PROSTATE BIOPSY     X 2, Dr Risa Grill  . PTNS Treatment     urinary incontinence  . QUADRICEPS REPAIR Right 1966    knee  . REFRACTIVE SURGERY  2015   Dr Bing Plume  . Terminal Ileum & Appendix Resected  1995   Crohn's    There were no vitals filed for this visit.  Subjective Assessment - 10/10/17 0836    Subjective  Got my B12 shot in back yesterday.  No other changes. Did some of my exercises, but not all, last night.    Pertinent History  OSA, recent cardiac ultrasounds (awaiting results)    Patient Stated Goals  Pt's goals for therapy are to improve stooped posture, especially with walking    Currently in Pain?  Yes    Pain Score  4     Pain Location  Back    Pain  Orientation  Right;Left;Lower    Pain Descriptors / Indicators  Aching    Pain Type  Chronic pain    Pain Onset  More than a month ago    Pain Frequency  Intermittent    Aggravating Factors   bending, straining movements    Pain Relieving Factors  sitting to rest, medication             LSVT ([facility]) -Maximal Daily Exercises   Floor to ceiling Other reps 8 reps-10 second hold   Side to side Other reps 8 reps-10 second hold   Step and Reach Forward 10 reps each sidewith coordinated arm movements; 2nd set with alternating legs and coordinated arms   Step and Reach Backward 10 reps each sidewith coordinated arm movements; 2nd set with alternating legs and coordinated arms   Step and Reach Sideways 10 reps each sidewith coordinated arm movements; 2nd set with alternating legs and coordinated arms   Rock and Reach Forward/Backward 10 reps each side withreciprocalarm swing   Rock and Reach Sideways 10 reps each side with coordinated arm swing/reach   Functional Component Task:1 - Sit to stand Other reps (comment) 10 reps each,  from 20" mat,then 5 reps from 17" chairwith arms forward for momentum.  In response to pt's question about restaurants, pt practices x 5 reps no forward arms for momentum, but rather pushes to "boost" BIG up from chair, with cues   Task2 Standing rocking,10 reps to initiate gait for short distance/turns, multiple reps   HierarchyTask 1 Full 360 degree turns to R and to L,2reps each direction, with cues for BIG Rock to turn; progress to short distance gait with rock and turn to change directions, 3 reps   HierarchyTask 2 Did NOT perform 10/10/17 visit due to time constraints:  Stepping over object (as in dog gate)-, simulating task for home, with cues for BIG effortto get close to target, then BIG step over, then rocking to initiate 2nd step BIG; practiced at least5reps; with additional height (to simulate tub)-practiced stepping over 2  reps-cues for BIG step over and BIG second step over to balance-progressed from using UE support at counter, to no UE support    Hierarchy Task 3Turning to sit in chair (in response to pt's reported difficulty with positioning when turning to sit in recliner.) Practiced full          turn BIG to sit "squared up" into chair. Repeated2times, then third time at end of session, pt needing cueing to prevent                                                                                    from prematurely sitting.   Pt needs mod-max verbal and occasional tactile cues for modeling and shaping of daily exercises; max VCs for BIG effort. Each exercise-pt performs several to get correct technique, then starts count.   He rates effort level with standing exercises as 6-7/10.                         [facility] Adult PT Treatment/Exercise - 10/10/17 0001      Ambulation/Gait   Ambulation/Gait  Yes    Ambulation/Gait Assistance  6: Modified independent (Device/Increase time);4: Min assist min assist for arm swing facilitation    Ambulation/Gait Assistance Details  Difficulty sequencing reciprocal arm swing, even with manual assistance, after about 50 ft, due to increased forward flexed posture    Ambulation Distance (Feet)  345 Feet 100 ft x 4    Assistive device  None    Gait Pattern  Step-through pattern;Decreased arm swing - right;Decreased arm swing - left;Decreased step length - right;Right flexed knee in stance;Shuffle;Decreased trunk rotation;Trunk flexed;Poor foot clearance - left;Poor foot clearance - right forward flexed posture at head and neck    Ambulation Surface  Level;Indoor                  PT Long Term Goals - 09/17/17 1315      PT LONG TERM GOAL #1   Title  Pt will be independent with Parkinson's specific HEP for  improved transfers, gait and balance.  UPDATED TARGET 10/12/17    Time  4 per recert 06/18/99    Period  Weeks    Status  On-going    Target Date  10/12/17  PT LONG TERM GOAL #2   Title  Pt will improve 5x sit<>stand to less than or equal to 13 seconds  for improved transfer efficiency and safety.    Baseline  15.94 sec 09/17/17    Time  4 per recert 07/20/29    Period  Weeks    Status  On-going    Target Date  10/12/17      PT LONG TERM GOAL #3   Title  Pt will improve TUG score to less than or equal to 13.5 seconds for decreased fall risk.    Baseline  10.6    Time  4    Period  Weeks    Status  Achieved      PT LONG TERM GOAL #4   Title  Pt will improve MiniBESTest score to at least 24/28 for decreased fall risk.    Time  4 per recert 10/11/74    Period  Weeks    Status  On-going    Target Date  10/12/17      PT LONG TERM GOAL #5   Title  Pt will verbalize tips to reduce freezing episodes with gait and turns.    Time  4 per recert 06/17/05    Period  Weeks    Status  On-going    Target Date  10/12/17      PT LONG TERM GOAL #6   Title  Pt will verbalize understanding of posture/positioning/body mechanics for improved posture/decreased back pain.    Time  4 per recert 08/16/08    Period  Weeks    Status  On-going    Target Date  10/12/17      PT LONG TERM GOAL #7   Title  Pt will verbalize plans for continued community fitness upon D/C from PT.    Time  4 per recert 11/12/67    Period  Weeks    Status  On-going    Target Date  10/12/17            Plan - 10/10/17 1047    Clinical Impression Statement  Continued focus on maximal daily exercises and functional tasks, gait this visit.  Worked on challenge of alternating sides for second set of step and reach exercises.  Continued verbal, tactile cues for increased BIG effort with movement patterns.  Recommended wife come in for therapy session in the near future to help with carryover at home, and pt is receptive.  Also,  recommended potential trial with assistive device to help with postural awareness for longer distance gait, with pt agreeable to trying.    Rehab Potential  Good    PT Frequency  4x / week per recert 09/17/52    PT Duration  4 weeks    PT Treatment/Interventions  ADLs/Self Care Home Management;Gait training;Stair training;Functional mobility training;Therapeutic activities;Therapeutic exercise;Balance training;Neuromuscular re-education;Patient/family education    PT Next Visit Plan  LSVT BIG maximal daily exercises, add to HEP, functional component tasks and begin hierarchy tasks (items to address:  standing tasks-dishwashing, yardwork, carrying items, walking dog, squats to pick up objects); gait with assistive device? Provide chart to kep exercises organized/ask wife to come in to improve compliance    Consulted and Agree with Plan of Care  Patient       Patient will benefit from skilled therapeutic intervention in order to improve the following deficits and impairments:  Abnormal gait, Decreased balance, Decreased mobility, Decreased strength, Difficulty walking, Impaired flexibility, Postural dysfunction, Impaired tone  Visit Diagnosis: Unsteadiness  on feet  Abnormal posture  Other abnormalities of gait and mobility  Other symptoms and signs involving the nervous system     Problem List Patient Active Problem List   Diagnosis Date Noted  . Medicare annual wellness visit, subsequent 12/04/2016  . Obesity 12/04/2016  . Hyperglycemia 02/04/2015  . Elevated blood pressure reading without diagnosis of hypertension 03/05/2013  . Nonspecific abnormal electrocardiogram (ECG) (EKG) 08/31/2011  . Parkinson's disease ([place]) 06/21/2010  . ANXIETY 07/01/2007  . BPH (benign prostatic hyperplasia) 07/01/2007  . HYPERLIPIDEMIA 04/10/2007  . DISORDER, DYSMETABOLIC SYNDROME X 06/14/7251  . DISORDER, DEPRESSIVE NEC 04/10/2007  . ELEVATED PROSTATE SPECIFIC ANTIGEN 04/10/2007  . History of non  anemic vitamin B12 deficiency 10/26/2006  . ERECTILE DYSFUNCTION 10/26/2006  . Crohn's disease of small intestine with intestinal obstruction ([place]) 10/26/2006    Jamileth Putzier W. 10/10/2017, 10:50 AM Frazier Butt., PT  [facility] [address] [place], [place], [address] Phone: [phone]   Fax:  [phone]  Name: Kyle Ball MRN: [id number] Date of Birth: 1945-08-09

## 2017-10-11 ENCOUNTER — Ambulatory Visit: Payer: Medicare Other | Admitting: Physical Therapy

## 2017-10-11 ENCOUNTER — Encounter: Payer: Self-pay | Admitting: Physical Therapy

## 2017-10-11 DIAGNOSIS — R293 Abnormal posture: Secondary | ICD-10-CM | POA: Diagnosis not present

## 2017-10-11 DIAGNOSIS — R29818 Other symptoms and signs involving the nervous system: Secondary | ICD-10-CM

## 2017-10-11 DIAGNOSIS — R2689 Other abnormalities of gait and mobility: Secondary | ICD-10-CM | POA: Diagnosis not present

## 2017-10-11 DIAGNOSIS — R2681 Unsteadiness on feet: Secondary | ICD-10-CM | POA: Diagnosis not present

## 2017-10-12 NOTE — Therapy (Signed)
[facility] [address] [place], [place], [address] Phone: [phone]   Fax:  [phone]  Physical Therapy Treatment  Patient Details  Name: Kyle Ball MRN: [id number] Date of Birth: Mar 10, 1946 Referring Provider: Pura Spice   Encounter Date: 10/11/2017  PT End of Session - 10/12/17 1800    Visit Number  15    Number of Visits  24 per recert 08/19/74    Date for PT Re-Evaluation  11/16/17    Authorization Type  Medicare and BCBS    PT Start Time  1104    PT Stop Time  1205    PT Time Calculation (min)  61 min    Activity Tolerance  Patient tolerated treatment well    Behavior During Therapy  [facility] for tasks assessed/performed       Past Medical History:  Diagnosis Date  . Anxiety and depression   . BPH (benign prostatic hypertrophy)   . Crohn's disease ([place]) 1979   After resection, 1 flare in 2012.  Marland Kitchen Dysmetabolic syndrome X   . ED (erectile dysfunction)   . Hyperlipidemia   . Internal hemorrhoids   . Parkinson's disease ([place])    [facility]    Past Surgical History:  Procedure Laterality Date  . COLONOSCOPY W/ BIOPSIES   06/2007, 2015   Crohn's, internal hemorrhoids, Dr Carlean Purl  . KNEE ARTHROSCOPY Right 06/2013  . PROSTATE BIOPSY     X 2, Dr Risa Grill  . PTNS Treatment     urinary incontinence  . QUADRICEPS REPAIR Right 1966    knee  . REFRACTIVE SURGERY  2015   Dr Bing Plume  . Terminal Ileum & Appendix Resected  1995   Crohn's    There were no vitals filed for this visit.  Subjective Assessment - 10/11/17 1106    Subjective  In answer to your question yesterday about the cane:  I'd rather not try because I want to push myself as much as I can.      Pertinent History  OSA, recent cardiac ultrasounds (awaiting results)    Patient Stated Goals  Pt's goals for therapy are to improve stooped posture, especially with walking    Currently in Pain?  Yes    Pain Score  5     Pain Location  Back     Pain Orientation  Right;Left;Lower    Pain Descriptors / Indicators  Aching    Pain Type  Chronic pain    Pain Onset  More than a month ago    Pain Frequency  Intermittent    Aggravating Factors   bending, straining movements    Pain Relieving Factors  sititng to rest, medication           LSVT ([facility]) -Maximal Daily Exercises   Floor to ceiling Other reps 8 reps-10 second hold   Side to side Other reps 8 reps-10 second hold   Step and Reach Forward 10 reps each sidewith coordinated arm movements; 2nd set with alternating legs and coordinated arms   Step and Reach Backward 10 reps each sidewith coordinated arm movements; 2nd set with alternating legs and coordinated arms   Step and Reach Sideways 10 reps each sidewith coordinated arm movements; 2nd set with alternating legs and coordinated arms   Rock and Reach Forward/Backward 10 reps each side withreciprocalarm swing   Rock and Reach Sideways 10 reps each side with coordinated arm swing/reach   Functional Component Task:1 - Sit to stand Other reps (comment)  10 reps each, from 20" mat,then 5 reps from 17" chairwith arms forward for momentum. In response to pt's question about performing sit<>stand while holding onto his bad, pt practices x 5 reps with RUE forward momentum and LUE holding bag and pushing up from knee   Task2 Standing rocking,10 reps to initiate gait for short distance/turns, multiple reps   HierarchyTask 1 Full 360 degree turns to R and to L,2reps each direction, with cues for BIG Rock to turn; progress to short distance gait with rock and turn to change directions, 3 reps   HierarchyTask 2 Did NOT perform 10/10/17 visit due to time constraints:  Stepping over object (as in dog gate)-, simulating task for home, with cues for BIG effortto get close to target, then BIG step over, then rocking to initiate 2nd step BIG; practiced at least5reps; with additional height (to simulate  tub)-practiced stepping over 2 reps-cues for BIG step over and BIG second step over to balance-progressed from using UE support at counter, to no UE support    Hierarchy Task 3Turning to sit in chair (in response to pt's reported difficulty with positioning when turning to sit in recliner.) Practiced full turn BIG to sit "squared up" into chair. Repeated2times, then third time at end of session, pt needing cueing to prevent from prematurely sitting.   Pt needsmod-max verbal and occasionaltactile cues for modeling and shaping of daily exercises; max VCs for BIG effort. Each exercise-pt performs several to get correct technique, then starts count.   He rates effort level with standing exercises as 6-7/10.    Additional functional task in sitting:  Seated march, L and R 5-10 reps with cues for increased intensity of leg lifts-pt reports weakness in RLE versus LLE-explained bradykinesia and high intensity movement pattern to lift RLE (to reinforce need for larger amplitude and higher intensity of movement used in LSVT BIG exercises.                         [facility] Adult PT Treatment/Exercise - 10/12/17 1755      Ambulation/Gait   Ambulation/Gait  Yes    Ambulation/Gait Assistance  6: Modified independent (Device/Increase time)    Ambulation/Gait Assistance Details  Introduced single walking pole in RLE, with initial cues for reciprocal placement with cane in sequence with LLE, with pt noted to have improved step length, foot clearance, and posture.    Ambulation Distance (Feet)  230 Feet no device; then 345 ft with single walking pole    Assistive device  -- single walking pole    Gait Pattern  Step-through pattern;Decreased arm swing -  right;Decreased arm swing - left;Decreased step length - right;Right flexed knee in stance;Shuffle;Decreased trunk rotation;Trunk flexed;Poor foot clearance - left;Poor foot clearance - right Improved with use of walking pole    Ambulation Surface  Level;Indoor             PT Education - 10/12/17 1759    Education provided  Yes    Education Details  Benefits to use of single walking pole for improved posture, step length, and foot clearance    Person(s) Educated  Patient    Methods  Explanation;Demonstration;Verbal cues    Comprehension  Verbalized understanding;Returned demonstration          PT Long Term Goals - 09/17/17 1315      PT LONG TERM GOAL #1   Title  Pt will be independent with Parkinson's specific HEP for improved transfers, gait and  balance.  UPDATED TARGET 10/12/17    Time  4 per recert 4/0/81    Period  Weeks    Status  On-going    Target Date  10/12/17      PT LONG TERM GOAL #2   Title  Pt will improve 5x sit<>stand to less than or equal to 13 seconds  for improved transfer efficiency and safety.    Baseline  15.94 sec 09/17/17    Time  4 per recert 09/14/79    Period  Weeks    Status  On-going    Target Date  10/12/17      PT LONG TERM GOAL #3   Title  Pt will improve TUG score to less than or equal to 13.5 seconds for decreased fall risk.    Baseline  10.6    Time  4    Period  Weeks    Status  Achieved      PT LONG TERM GOAL #4   Title  Pt will improve MiniBESTest score to at least 24/28 for decreased fall risk.    Time  4 per recert 01/14/62    Period  Weeks    Status  On-going    Target Date  10/12/17      PT LONG TERM GOAL #5   Title  Pt will verbalize tips to reduce freezing episodes with gait and turns.    Time  4 per recert 06/16/95    Period  Weeks    Status  On-going    Target Date  10/12/17      PT LONG TERM GOAL #6   Title  Pt will verbalize understanding of posture/positioning/body mechanics for improved posture/decreased back pain.     Time  4 per recert 0/2/63    Period  Weeks    Status  On-going    Target Date  10/12/17      PT LONG TERM GOAL #7   Title  Pt will verbalize plans for continued community fitness upon D/C from PT.    Time  4 per recert 12/17/56    Period  Weeks    Status  On-going    Target Date  10/12/17            Plan - 10/12/17 1801    Clinical Impression Statement  Continued focus on increased intensity with posture and performance of maximal daily exercises and functional tasks.  Also, addition of single walking pole to try to engage patient in longer distance gait with improved posture and step length.  Pt is able to take 1-2 laps (with short break in between) with walking pole maintaining more upright posture and improved step length for entire 115 ft.  Pt reports he notices difference with improved gait.  Pt will continue to beneift from skilled PT to address posture, gait and functional strength and balance.    Rehab Potential  Good    PT Frequency  4x / week per recert 01/14/01    PT Duration  4 weeks    PT Treatment/Interventions  ADLs/Self Care Home Management;Gait training;Stair training;Functional mobility training;Therapeutic activities;Therapeutic exercise;Balance training;Neuromuscular re-education;Patient/family education    PT Next Visit Plan  LSVT BIG maximal daily exercises, add to HEP, functional component tasks and begin hierarchy tasks (items to address:  standing tasks-dishwashing, yardwork, carrying items, walking dog, squats to pick up objects); gait with assistive device? Provide chart to kep exercises organized/ask wife to come in to improve compliance    Consulted and Agree with  Plan of Care  Patient       Patient will benefit from skilled therapeutic intervention in order to improve the following deficits and impairments:  Abnormal gait, Decreased balance, Decreased mobility, Decreased strength, Difficulty walking, Impaired flexibility, Postural dysfunction, Impaired  tone  Visit Diagnosis: Abnormal posture  Other abnormalities of gait and mobility  Other symptoms and signs involving the nervous system     Problem List Patient Active Problem List   Diagnosis Date Noted  . Medicare annual wellness visit, subsequent 12/04/2016  . Obesity 12/04/2016  . Hyperglycemia 02/04/2015  . Elevated blood pressure reading without diagnosis of hypertension 03/05/2013  . Nonspecific abnormal electrocardiogram (ECG) (EKG) 08/31/2011  . Parkinson's disease ([place]) 06/21/2010  . ANXIETY 07/01/2007  . BPH (benign prostatic hyperplasia) 07/01/2007  . HYPERLIPIDEMIA 04/10/2007  . DISORDER, DYSMETABOLIC SYNDROME X 47/42/5956  . DISORDER, DEPRESSIVE NEC 04/10/2007  . ELEVATED PROSTATE SPECIFIC ANTIGEN 04/10/2007  . History of non anemic vitamin B12 deficiency 10/26/2006  . ERECTILE DYSFUNCTION 10/26/2006  . Crohn's disease of small intestine with intestinal obstruction ([place]) 10/26/2006    Puneet Selden W. 10/12/2017, 6:04 PM  Frazier Butt., PT   [facility] [address] [place], [place], [address] Phone: [phone]   Fax:  [phone]  Name: Kyle Ball MRN: [id number] Date of Birth: 04/14/1946

## 2017-10-15 ENCOUNTER — Ambulatory Visit: Payer: Medicare Other | Admitting: Physical Therapy

## 2017-10-15 ENCOUNTER — Encounter: Payer: Self-pay | Admitting: Physical Therapy

## 2017-10-15 DIAGNOSIS — R2681 Unsteadiness on feet: Secondary | ICD-10-CM

## 2017-10-15 DIAGNOSIS — R2689 Other abnormalities of gait and mobility: Secondary | ICD-10-CM | POA: Diagnosis not present

## 2017-10-15 DIAGNOSIS — R293 Abnormal posture: Secondary | ICD-10-CM | POA: Diagnosis not present

## 2017-10-15 DIAGNOSIS — R29818 Other symptoms and signs involving the nervous system: Secondary | ICD-10-CM

## 2017-10-15 NOTE — Therapy (Signed)
[facility] [address] [place], [place], [address] Phone: [phone]   Fax:  [phone]  Physical Therapy Treatment  Patient Details  Name: Kyle Ball MRN: [id number] Date of Birth: 1945-09-15 Referring Provider: Pura Spice   Encounter Date: 10/15/2017  PT End of Session - 10/15/17 2235    Visit Number  16    Number of Visits  24 per recert 02/14/75    Date for PT Re-Evaluation  11/16/17    Authorization Type  Medicare and BCBS    PT Start Time  1103    PT Stop Time  1203    PT Time Calculation (min)  60 min    Activity Tolerance  Patient tolerated treatment well    Behavior During Therapy  [facility] for tasks assessed/performed       Past Medical History:  Diagnosis Date  . Anxiety and depression   . BPH (benign prostatic hypertrophy)   . Crohn's disease ([place]) 1979   After resection, 1 flare in 2012.  Marland Kitchen Dysmetabolic syndrome X   . ED (erectile dysfunction)   . Hyperlipidemia   . Internal hemorrhoids   . Parkinson's disease ([place])    [facility]    Past Surgical History:  Procedure Laterality Date  . COLONOSCOPY W/ BIOPSIES   06/2007, 2015   Crohn's, internal hemorrhoids, Dr Carlean Purl  . KNEE ARTHROSCOPY Right 06/2013  . PROSTATE BIOPSY     X 2, Dr Risa Grill  . PTNS Treatment     urinary incontinence  . QUADRICEPS REPAIR Right 1966    knee  . REFRACTIVE SURGERY  2015   Dr Bing Plume  . Terminal Ileum & Appendix Resected  1995   Crohn's    There were no vitals filed for this visit.  Subjective Assessment - 10/15/17 1109    Subjective  [place].  Did a fair amount weed-eating.  No falls.    Pertinent History  OSA, recent cardiac ultrasounds (awaiting results)    Patient Stated Goals  Pt's goals for therapy are to improve stooped posture, especially with walking    Currently in Pain?  Yes    Pain Score  2     Pain Location  Back    Pain Orientation  Right;Left;Lower    Pain  Descriptors / Indicators  Aching    Pain Type  Chronic pain    Pain Onset  More than a month ago    Pain Frequency  Intermittent    Aggravating Factors   bending, straining movements    Pain Relieving Factors  sitting to rest, medication            LSVT ([facility]) -Maximal Daily Exercises   Floor to ceiling Other reps 8 reps-10 second hold   Side to side Other reps 8 reps-10 second hold   Step and Reach Forward 10 reps each sidewith coordinated arm movements with alternating legs   Step and Reach Backward 10 reps each sidewith coordinated arm movements; 2nd set with alternating legs   Step and Reach Sideways 10 reps each sidewith coordinated arm movements; 2nd set with alternating legs    Rock and Reach Forward/Backward 10 reps each side withreciprocalarm swing   Rock and Reach Sideways 10 reps each side with coordinated arm swing/reach   Functional Component Task:1 - Sit to stand Other reps (comment) 5reps each, from 20" mat,then 5 repsfrom 17" chairwith arms forward for momentum.    Task2 Standing rocking,10 reps  to initiate gait for short distance/turns, multiple reps   HierarchyTask 1 Full 360 degree turns to R and to L,2reps each direction, with cues for BIG Rock to turn; progress to short distance gait with rock and turn to change directions, 3 reps   HierarchyTask 2 Did NOT perform 10/15/17 visit due to time constraints and pt reporting they are removing the dog gait;Stepping over object (as in dog gate)-, simulating task for home, with cues for BIG effortto get close to target, then BIG step over, then rocking to initiate 2nd step BIG; practiced at least5reps; with additional height (to simulate tub)-practiced stepping over 2 reps-cues for BIG step over and BIG second step over to balance-progressed from using UE support at counter, to no UE support    Hierarchy Task 3Stair negotiation 4 reps, with intermittent use of rail,  alternating steps, cues for foot placement, foot clearance with BIG effort.  Stair negotiation with     carrying bag x 2 reps, alternating steps and intermittent UE support at rail.  Pt needsmod verbal and occasionaltactile cues for modeling and shaping of daily exercises; max VCs for BIG effort. Each exercise-pt performs 1-2 reps to get correct technique, then starts count.                         [facility] Adult PT Treatment/Exercise - 10/15/17 0001      Ambulation/Gait   Ambulation/Gait  Yes    Ambulation/Gait Assistance  6: Modified independent (Device/Increase time)    Ambulation/Gait Assistance Details  Pt brought in his single walking pole today and adjusted it slightly for optimal sequence for improved step length and reciprocal gait pattern.    Ambulation Distance (Feet)  200 Feet x 2, then 120 ft x 2, then 100 ft x 2    Assistive device  -- single walking pole    Gait Pattern  Step-through pattern;Decreased arm swing - right;Decreased arm swing - left;Decreased step length - right;Right flexed knee in stance;Shuffle;Decreased trunk rotation;Trunk flexed;Poor foot clearance - left;Poor foot clearance - right improved with use of walking pole    Ambulation Surface  Level;Indoor                  PT Long Term Goals - 09/17/17 1315      PT LONG TERM GOAL #1   Title  Pt will be independent with Parkinson's specific HEP for improved transfers, gait and balance.  UPDATED TARGET 10/12/17    Time  4 per recert 02/14/84    Period  Weeks    Status  On-going    Target Date  10/12/17      PT LONG TERM GOAL #2   Title  Pt will improve 5x sit<>stand to less than or equal to 13 seconds  for improved transfer efficiency and safety.    Baseline  15.94 sec 09/17/17    Time  4 per recert 09/15/25    Period  Weeks    Status  On-going    Target Date  10/12/17      PT LONG TERM GOAL #3   Title  Pt will improve TUG score to less  than or equal to 13.5 seconds for decreased fall risk.    Baseline  10.6    Time  4    Period  Weeks    Status  Achieved      PT LONG TERM GOAL #4   Title  Pt will improve MiniBESTest score to  at least 24/28 for decreased fall risk.    Time  4 per recert 11/12/85    Period  Weeks    Status  On-going    Target Date  10/12/17      PT LONG TERM GOAL #5   Title  Pt will verbalize tips to reduce freezing episodes with gait and turns.    Time  4 per recert 10/15/41    Period  Weeks    Status  On-going    Target Date  10/12/17      PT LONG TERM GOAL #6   Title  Pt will verbalize understanding of posture/positioning/body mechanics for improved posture/decreased back pain.    Time  4 per recert 08/11/93    Period  Weeks    Status  On-going    Target Date  10/12/17      PT LONG TERM GOAL #7   Title  Pt will verbalize plans for continued community fitness upon D/C from PT.    Time  4 per recert 06/19/82    Period  Weeks    Status  On-going    Target Date  10/12/17            Plan - 10/15/17 2235    Clinical Impression Statement  Continued to work on posture, intensity of movement patterns, and large amplitude movements.  Pt brings in his walking pole today, and utilized with gait for improved step length and posture with gait.  For hierarchy tasks, pt reports not having to do the stepping over dog gait as it is being removed.  Pt may beneift from this continued type of activity, however, to simulate stepping in and out of bathtub.      Rehab Potential  Good    PT Frequency  4x / week per recert 06/17/58    PT Duration  4 weeks    PT Treatment/Interventions  ADLs/Self Care Home Management;Gait training;Stair training;Functional mobility training;Therapeutic activities;Therapeutic exercise;Balance training;Neuromuscular re-education;Patient/family education    PT Next Visit Plan  Continue LSVT BIG maximal daily exercises, add to HEP, functional component tasks and begin hierarchy tasks (items  to address:  standing tasks-dishwashing, yardwork, carrying items, walking dog, squats to pick up objects); gait with assistive device? Provide chart to kep exercises organized/ask wife to come in to improve compliance    Consulted and Agree with Plan of Care  Patient       Patient will benefit from skilled therapeutic intervention in order to improve the following deficits and impairments:  Abnormal gait, Decreased balance, Decreased mobility, Decreased strength, Difficulty walking, Impaired flexibility, Postural dysfunction, Impaired tone  Visit Diagnosis: Abnormal posture  Unsteadiness on feet  Other symptoms and signs involving the nervous system  Other abnormalities of gait and mobility     Problem List Patient Active Problem List   Diagnosis Date Noted  . Medicare annual wellness visit, subsequent 12/04/2016  . Obesity 12/04/2016  . Hyperglycemia 02/04/2015  . Elevated blood pressure reading without diagnosis of hypertension 03/05/2013  . Nonspecific abnormal electrocardiogram (ECG) (EKG) 08/31/2011  . Parkinson's disease ([place]) 06/21/2010  . ANXIETY 07/01/2007  . BPH (benign prostatic hyperplasia) 07/01/2007  . HYPERLIPIDEMIA 04/10/2007  . DISORDER, DYSMETABOLIC SYNDROME X 63/06/6008  . DISORDER, DEPRESSIVE NEC 04/10/2007  . ELEVATED PROSTATE SPECIFIC ANTIGEN 04/10/2007  . History of non anemic vitamin B12 deficiency 10/26/2006  . ERECTILE DYSFUNCTION 10/26/2006  . Crohn's disease of small intestine with intestinal obstruction ([place]) 10/26/2006    Chantee Cerino W. 10/15/2017, 10:39 PM  [facility] [address] [place], [place], [address] Phone: [phone]   Fax:  [phone]  Name: Kyle Ball MRN: [id number] Date of Birth: 1946/02/12

## 2017-10-16 ENCOUNTER — Encounter: Payer: Self-pay | Admitting: Physical Therapy

## 2017-10-16 ENCOUNTER — Ambulatory Visit: Payer: Medicare Other | Admitting: Physical Therapy

## 2017-10-16 DIAGNOSIS — R293 Abnormal posture: Secondary | ICD-10-CM

## 2017-10-16 DIAGNOSIS — R2681 Unsteadiness on feet: Secondary | ICD-10-CM | POA: Diagnosis not present

## 2017-10-16 DIAGNOSIS — R2689 Other abnormalities of gait and mobility: Secondary | ICD-10-CM

## 2017-10-16 DIAGNOSIS — R29818 Other symptoms and signs involving the nervous system: Secondary | ICD-10-CM | POA: Diagnosis not present

## 2017-10-17 ENCOUNTER — Ambulatory Visit: Payer: Medicare Other | Admitting: Physical Therapy

## 2017-10-17 ENCOUNTER — Telehealth: Payer: Self-pay | Admitting: Internal Medicine

## 2017-10-17 DIAGNOSIS — R2681 Unsteadiness on feet: Secondary | ICD-10-CM | POA: Diagnosis not present

## 2017-10-17 DIAGNOSIS — R29818 Other symptoms and signs involving the nervous system: Secondary | ICD-10-CM | POA: Diagnosis not present

## 2017-10-17 DIAGNOSIS — R293 Abnormal posture: Secondary | ICD-10-CM

## 2017-10-17 DIAGNOSIS — G20 Parkinson's disease: Secondary | ICD-10-CM | POA: Diagnosis not present

## 2017-10-17 DIAGNOSIS — R2689 Other abnormalities of gait and mobility: Secondary | ICD-10-CM

## 2017-10-17 DIAGNOSIS — R296 Repeated falls: Secondary | ICD-10-CM | POA: Diagnosis not present

## 2017-10-17 DIAGNOSIS — Z79899 Other long term (current) drug therapy: Secondary | ICD-10-CM | POA: Diagnosis not present

## 2017-10-17 NOTE — Therapy (Signed)
[facility] [address] [place], [place], [address] Phone: [phone]   Fax:  [phone]  Physical Therapy Treatment  Patient Details  Name: Kyle Ball MRN: [id number] Date of Birth: [birth year] Referring Provider: Pura Spice   Encounter Date: 10/16/2017  PT End of Session - 10/17/17 1916    Visit Number  17    Number of Visits  24 per recert 09/16/54    Date for PT Re-Evaluation  11/16/17    Authorization Type  Medicare and BCBS    PT Start Time  1148    PT Stop Time  1250    PT Time Calculation (min)  62 min    Activity Tolerance  Patient tolerated treatment well    Behavior During Therapy  [facility] for tasks assessed/performed       Past Medical History:  Diagnosis Date  . Anxiety and depression   . BPH (benign prostatic hypertrophy)   . Crohn's disease ([place]) 1979   After resection, 1 flare in 2012.  Marland Kitchen Dysmetabolic syndrome X   . ED (erectile dysfunction)   . Hyperlipidemia   . Internal hemorrhoids   . Parkinson's disease ([place])    [facility]    Past Surgical History:  Procedure Laterality Date  . COLONOSCOPY W/ BIOPSIES   06/2007, 2015   Crohn's, internal hemorrhoids, Dr Carlean Purl  . KNEE ARTHROSCOPY Right 06/2013  . PROSTATE BIOPSY     X 2, Dr Risa Grill  . PTNS Treatment     urinary incontinence  . QUADRICEPS REPAIR Right 1966    knee  . REFRACTIVE SURGERY  2015   Dr Bing Plume  . Terminal Ileum & Appendix Resected  1995   Crohn's    There were no vitals filed for this visit.  Subjective Assessment - 10/16/17 1149    Subjective  Having a little soreness from picking up groceries.  Wife, [name] Pulse, reports she doesn't see him doing his exercises at home.    Patient is accompained by:  Family member Wife, [name] Pulse    Pertinent History  OSA, recent cardiac ultrasounds (awaiting results)    Patient Stated Goals  Pt's goals for therapy are to improve stooped posture, especially with walking    Currently  in Pain?  Yes    Pain Score  5     Pain Location  Back    Pain Orientation  Right;Left;Lower    Pain Descriptors / Indicators  Aching    Pain Type  Chronic pain    Pain Onset  More than a month ago    Pain Frequency  Intermittent    Aggravating Factors   bending, straining movements    Pain Relieving Factors  sitting to rest, medication          LSVT ([facility]) -Maximal Daily Exercises   Floor to ceiling Other reps 8 reps-10 second hold   Side to side Other reps 8 reps-10 second hold   Step and Reach Forward 10 reps each sidewith coordinated arm movements with alternating legs   Step and Reach Backward 10 reps each sidewith coordinated arm movements with alternating legs   Step and Reach Sideways 10 reps each sidewith coordinated arm movements with alternating legs    Rock and Reach Forward/Backward 10 reps each side withreciprocalarm swing   Rock and Reach Sideways 10 reps each side with coordinated arm swing/reach   Functional Component Task:1 - Sit to stand Other reps (comment) 5reps each, from 20" mat,then 5 repsfrom 18" chairwith  arms forward for momentum. Practiced x 5 reps from chair pushing from armrest of chair (to simulate restaurant)   Task2 Standing rocking,10 reps to initiate gait for short distance/turns, multiple reps.  Progressed to rocking and turning to fully "square up" to sit at chair/mat   HierarchyTask 1 Full 360 degree turns to R and to L,2reps each direction, with cues for BIG Rock to turn; progress to short distance gait with rock and turn to change directions, 3 reps   HierarchyTask 2 Did NOT perform 10/16/17 visit due to time constraints and pt reporting they are removing the dog gait;Stepping over object (as in dog gate)-, simulating task for home, with cues for BIG effortto get close to target, then BIG step over, then rocking to initiate 2nd step BIG; practiced at least5reps; with additional height (to simulate  tub)-practiced stepping over 2 reps-cues for BIG step over and BIG second step over to balance-progressed from using UE support at counter, to no UE support    Hierarchy Task 3Did NOT perform 10/16/17 visitStair negotiation 4 reps, with intermittent use of rail, alternating steps, cues for foot placement, foot clearance with BIG    effort.  Stair negotiation with   carrying bag x 2 reps, alternating steps and intermittent UE support at rail.  Practiced sitting at table with upright BIG posture, with cues for BIG deliberate movement for eating tasks.  Cues to "hinge" forward at hips to get closer to plate.  (in response to wife's concerns that he leans forward into plate versus bringing food up to his mouth.)  Pt needsmod verbal and occasionaltactile cues for modeling and shaping of daily exercises; max VCs throughout exercises for BIG effort. Each exercise-pt performs 1-2 reps to get correct technique, then starts count.                          [facility] Adult PT Treatment/Exercise - 10/17/17 0001      Ambulation/Gait   Ambulation/Gait  Yes    Ambulation/Gait Assistance  6: Modified independent (Device/Increase time)    Ambulation/Gait Assistance Details  Utilized clinic walking pole today, as pt did not bring his in today.    Ambulation Distance (Feet)  230 Feet x 2, 230 ft with walking pole    Assistive device  None single walking pole    Gait Pattern  Step-through pattern;Decreased arm swing - right;Decreased arm swing - left;Decreased step length - right;Right flexed knee in stance;Decreased trunk rotation;Trunk flexed;Poor foot clearance - left;Poor foot clearance - right Improved step length with use of walking pole    Ambulation Surface  Level;Indoor      Self-Care   Self-Care  Other Self-Care Comments    Other Self-Care Comments   Wife present for PT session for education in exercises and how she can help cue him  with HEP and with BIG movement patterns in general at home.  She has multiple questions addressing pt's posture, slowed movement patterns.  Discussed optimal exercise (and work effort level) as well as rationale of LSVT BIG program in conjunction with optimal medication, which helps with overall improved movement.  Pt has MD appt on 10/18/17, and encouraged patient to ask questions to MD regarding continued slowed movements, postural changes and difficulty "breaking through" with high enough intensity level for improved carryover with functional mobility at home.             PT Education - 10/17/17 1916    Education provided  Yes    Education Details  Importance of consistent HEP performance    Person(s) Educated  Patient;Spouse    Methods  Explanation;Demonstration;Verbal cues    Comprehension  Verbalized understanding;Returned demonstration;Verbal cues required          PT Long Term Goals - 09/17/17 1315      PT LONG TERM GOAL #1   Title  Pt will be independent with Parkinson's specific HEP for improved transfers, gait and balance.  UPDATED TARGET 10/12/17    Time  4 per recert 10/10/00    Period  Weeks    Status  On-going    Target Date  10/12/17      PT LONG TERM GOAL #2   Title  Pt will improve 5x sit<>stand to less than or equal to 13 seconds  for improved transfer efficiency and safety.    Baseline  15.94 sec 09/17/17    Time  4 per recert 10/18/50    Period  Weeks    Status  On-going    Target Date  10/12/17      PT LONG TERM GOAL #3   Title  Pt will improve TUG score to less than or equal to 13.5 seconds for decreased fall risk.    Baseline  10.6    Time  4    Period  Weeks    Status  Achieved      PT LONG TERM GOAL #4   Title  Pt will improve MiniBESTest score to at least 24/28 for decreased fall risk.    Time  4 per recert 12/16/80    Period  Weeks    Status  On-going    Target Date  10/12/17      PT LONG TERM GOAL #5   Title  Pt will verbalize tips to reduce  freezing episodes with gait and turns.    Time  4 per recert 09/11/33    Period  Weeks    Status  On-going    Target Date  10/12/17      PT LONG TERM GOAL #6   Title  Pt will verbalize understanding of posture/positioning/body mechanics for improved posture/decreased back pain.    Time  4 per recert 08/15/12    Period  Weeks    Status  On-going    Target Date  10/12/17      PT LONG TERM GOAL #7   Title  Pt will verbalize plans for continued community fitness upon D/C from PT.    Time  4 per recert 09/12/13    Period  Weeks    Status  On-going    Target Date  10/12/17            Plan - 10/17/17 1917    Clinical Impression Statement  Wife present today to observe session and asked questions about how to help with pt's mobility.  She has broken collar bone and cannot help with exercise demonstration, but PT instructed her in cueing methods for HEP and functional tasks.  Reiterated to patient importance of HEP performance consistently and with BIG effort for improved functional carryover.  Pt will continue to benefit from skilled PT to address posture, balance, and gait.    Rehab Potential  Good    PT Frequency  4x / week per recert 4/0/08    PT Duration  4 weeks    PT Treatment/Interventions  ADLs/Self Care Home Management;Gait training;Stair training;Functional mobility training;Therapeutic activities;Therapeutic exercise;Balance training;Neuromuscular re-education;Patient/family education    PT Next  Visit Plan  Continue LSVT BIG maximal daily exercises, add to HEP, functional component tasks and hierarchy tasks (to address:  standing tasks-dishwashing, yardwork, carrying items, walking dog, squats to pick up objects); gait with assistive device    Consulted and Agree with Plan of Care  Patient;Family member/caregiver    Family Member Consulted  wife       Patient will benefit from skilled therapeutic intervention in order to improve the following deficits and impairments:  Abnormal  gait, Decreased balance, Decreased mobility, Decreased strength, Difficulty walking, Impaired flexibility, Postural dysfunction, Impaired tone  Visit Diagnosis: Abnormal posture  Unsteadiness on feet  Other abnormalities of gait and mobility     Problem List Patient Active Problem List   Diagnosis Date Noted  . Medicare annual wellness visit, subsequent 12/04/2016  . Obesity 12/04/2016  . Hyperglycemia 02/04/2015  . Elevated blood pressure reading without diagnosis of hypertension 03/05/2013  . Nonspecific abnormal electrocardiogram (ECG) (EKG) 08/31/2011  . Parkinson's disease ([place]) 06/21/2010  . ANXIETY 07/01/2007  . BPH (benign prostatic hyperplasia) 07/01/2007  . HYPERLIPIDEMIA 04/10/2007  . DISORDER, DYSMETABOLIC SYNDROME X 94/85/4627  . DISORDER, DEPRESSIVE NEC 04/10/2007  . ELEVATED PROSTATE SPECIFIC ANTIGEN 04/10/2007  . History of non anemic vitamin B12 deficiency 10/26/2006  . ERECTILE DYSFUNCTION 10/26/2006  . Crohn's disease of small intestine with intestinal obstruction ([place]) 10/26/2006    Jacoba Cherney W. 10/17/2017, 7:24 PM  Frazier Butt., PT   [facility] [address] [place], [place], [address] Phone: [phone]   Fax:  [phone]  Name: THEODORE VIRGIN MRN: [id number] Date of Birth: [birth year]

## 2017-10-17 NOTE — Telephone Encounter (Signed)
Attempted to leave message but received message that voicemail box if full and cannot receive messages at this time. Will try later.

## 2017-10-18 ENCOUNTER — Encounter: Payer: Self-pay | Admitting: Physical Therapy

## 2017-10-18 DIAGNOSIS — N3941 Urge incontinence: Secondary | ICD-10-CM | POA: Diagnosis not present

## 2017-10-18 NOTE — Therapy (Signed)
[facility] [address] [place], [place], [address] Phone: [phone]   Fax:  [phone]  Physical Therapy Treatment  Patient Details  Name: Kyle Ball MRN: [id number] Date of Birth: 02-Oct-1945 Referring Provider: Pura Spice   Encounter Date: 10/17/2017  PT End of Session - 10/18/17 2132    Visit Number  18    Number of Visits  24 per recert 11/18/60    Date for PT Re-Evaluation  11/16/17    Authorization Type  Medicare and BCBS    PT Start Time  1109    PT Stop Time  1210    PT Time Calculation (min)  61 min    Activity Tolerance  Patient tolerated treatment well    Behavior During Therapy  [facility] for tasks assessed/performed       Past Medical History:  Diagnosis Date  . Anxiety and depression   . BPH (benign prostatic hypertrophy)   . Crohn's disease ([place]) 1979   After resection, 1 flare in 2012.  Marland Kitchen Dysmetabolic syndrome X   . ED (erectile dysfunction)   . Hyperlipidemia   . Internal hemorrhoids   . Parkinson's disease ([place])    [facility]    Past Surgical History:  Procedure Laterality Date  . COLONOSCOPY W/ BIOPSIES   06/2007, 2015   Crohn's, internal hemorrhoids, Dr Carlean Purl  . KNEE ARTHROSCOPY Right 06/2013  . PROSTATE BIOPSY     X 2, Dr Risa Grill  . PTNS Treatment     urinary incontinence  . QUADRICEPS REPAIR Right 1966    knee  . REFRACTIVE SURGERY  2015   Dr Bing Plume  . Terminal Ileum & Appendix Resected  1995   Crohn's    There were no vitals filed for this visit.  Subjective Assessment - 10/18/17 2127    Subjective  To see Dr. Parke Poisson later today    Patient is accompained by:  Family member Wife, [name] Pulse    Pertinent History  OSA, recent cardiac ultrasounds (awaiting results)    Patient Stated Goals  Pt's goals for therapy are to improve stooped posture, especially with walking    Currently in Pain?  Yes    Pain Score  5     Pain Location  Back    Pain Orientation   Right;Left;Lower    Pain Descriptors / Indicators  Aching    Pain Type  Chronic pain    Pain Onset  More than a month ago    Pain Frequency  Intermittent    Aggravating Factors   bending, straining movements    Pain Relieving Factors  sitting to rest, medication             LSVT ([facility]) -Maximal Daily Exercises   Floor to ceiling Other reps 8 reps-10 second holdwith flicks at end range   Side to side Other reps 8 reps-10 second holdwith flicks at end range   Step and Reach Forward 10 reps each sidewith coordinated arm movements with alternating legs   Step and Reach Backward 10 reps each sidewith coordinated arm movements with alternating legs   Step and Reach Sideways 10 reps each sidewith coordinated arm movements with alternating legs    Rock and Reach Forward/Backward 10 reps each side withreciprocalarm swing   Rock and Reach Sideways 10 reps each side with coordinated arm swing/reach   Functional Component Task:1 - Sit to stand Other reps (comment) 5reps each, from 20" mat,then 5 repsfrom 18" chairwith arms forward for momentum.  Practiced x 5 reps from chair pushing from armrest of chair (to simulate restaurant)   Task2 Standing rocking,10 reps to initiate gait for short distance/turns, multiple reps.  Progressed to rocking and turning to fully "square up" to sit at chair/mat   HierarchyTask 1 Full 360 degree turns to R and to L,2reps each direction, with cues for BIG Rock to turn; progress to short distance gait with rock and turn to change directions, 3 reps   HierarchyTask 2 Reviewed side step activity to simulate tub stepping at home (he no longer will need dog gait)Stepping over obstacle as in tub ledge -, simulating task for home, with cues for BIG effortto get close to target, then BIG step over, then rocking to initiate 2nd step BIG; practiced at least5reps; using UE support at counter    Hierarchy Task 3Stair negotiation 4  reps, with intermittent use of rail, alternating steps, cues for foot placement, foot clearance with BIG effort.    Practiced stepping up to chair at table, moving chair back, scooting up BIG effort to table, scooting back BIG effort from table, then pushing chair back to table, BIG steps to walk away.  Also, sitting at table with upright BIG posture, with cues for BIG deliberate movement for eating tasks.  Cues to "hinge" forward at hips to get closer to plate.  (in response to wife's concerns that he leans forward into plate versus bringing food up to his mouth.)  Pt needsmod verbal and occasionaltactile cues for modeling and shaping of daily exercises; max VCs throughout exercises for BIG effort. Each exercise-pt performs 1-2 repsto get correct technique, then starts count.                       [facility] Adult PT Treatment/Exercise - 10/18/17 0001      Ambulation/Gait   Ambulation/Gait  Yes    Ambulation/Gait Assistance  6: Modified independent (Device/Increase time);5: Supervision    Ambulation/Gait Assistance Details  Used clinic walking pole for improvedstep length    Ambulation Distance (Feet)  200 Feet x 2, 230 ft x 2    Assistive device  None walking pole    Gait Pattern  Step-through pattern;Decreased arm swing - right;Decreased arm swing - left;Decreased step length - right;Right flexed knee in stance;Decreased trunk rotation;Trunk flexed;Poor foot clearance - left;Poor foot clearance - right    Ambulation Surface  Level;Indoor    Gait Comments  PT provides cues for brief stope to reset posture, arm swing and step length, but pt continues to experience "lurching type gait", wher he is more forward flexed and arms swing together.                  PT Long Term Goals - 09/17/17 1315      PT LONG TERM GOAL #1   Title  Pt will be independent with Parkinson's specific HEP for improved transfers, gait and balance.  UPDATED TARGET 10/12/17    Time  4 per  recert 10/15/41    Period  Weeks    Status  On-going    Target Date  10/12/17      PT LONG TERM GOAL #2   Title  Pt will improve 5x sit<>stand to less than or equal to 13 seconds  for improved transfer efficiency and safety.    Baseline  15.94 sec 09/17/17    Time  4 per recert 08/11/93    Period  Weeks    Status  On-going  Target Date  10/12/17      PT LONG TERM GOAL #3   Title  Pt will improve TUG score to less than or equal to 13.5 seconds for decreased fall risk.    Baseline  10.6    Time  4    Period  Weeks    Status  Achieved      PT LONG TERM GOAL #4   Title  Pt will improve MiniBESTest score to at least 24/28 for decreased fall risk.    Time  4 per recert 06/17/08    Period  Weeks    Status  On-going    Target Date  10/12/17      PT LONG TERM GOAL #5   Title  Pt will verbalize tips to reduce freezing episodes with gait and turns.    Time  4 per recert 02/16/03    Period  Weeks    Status  On-going    Target Date  10/12/17      PT LONG TERM GOAL #6   Title  Pt will verbalize understanding of posture/positioning/body mechanics for improved posture/decreased back pain.    Time  4 per recert 10/14/07    Period  Weeks    Status  On-going    Target Date  10/12/17      PT LONG TERM GOAL #7   Title  Pt will verbalize plans for continued community fitness upon D/C from PT.    Time  4 per recert 01/10/18    Period  Weeks    Status  On-going    Target Date  10/12/17            Plan - 10/18/17 2133    Clinical Impression Statement  Pt appears to be performing maximal daily exercises with improved technique and effort; seems appreciative of tips and work on bigger movement patterns.  Continues to need cueing for increased effort for larger movement patterns in general, especially with posture and gait activities.    Rehab Potential  Good    PT Frequency  4x / week per recert 06/15/76    PT Duration  4 weeks    PT Treatment/Interventions  ADLs/Self Care Home Management;Gait  training;Stair training;Functional mobility training;Therapeutic activities;Therapeutic exercise;Balance training;Neuromuscular re-education;Patient/family education    PT Next Visit Plan  Continue LSVT BIG maximal daily exercises, add to HEP, functional component tasks and hierarchy tasks (to address:  standing tasks-dishwashing, yardwork, carrying items, walking dog, squats to pick up objects); gait with assistive device    Consulted and Agree with Plan of Care  Patient;Family member/caregiver    Family Member Consulted  wife       Patient will benefit from skilled therapeutic intervention in order to improve the following deficits and impairments:  Abnormal gait, Decreased balance, Decreased mobility, Decreased strength, Difficulty walking, Impaired flexibility, Postural dysfunction, Impaired tone  Visit Diagnosis: Abnormal posture  Unsteadiness on feet  Other abnormalities of gait and mobility  Other symptoms and signs involving the nervous system     Problem List Patient Active Problem List   Diagnosis Date Noted  . Medicare annual wellness visit, subsequent 12/04/2016  . Obesity 12/04/2016  . Hyperglycemia 02/04/2015  . Elevated blood pressure reading without diagnosis of hypertension 03/05/2013  . Nonspecific abnormal electrocardiogram (ECG) (EKG) 08/31/2011  . Parkinson's disease ([place]) 06/21/2010  . ANXIETY 07/01/2007  . BPH (benign prostatic hyperplasia) 07/01/2007  . HYPERLIPIDEMIA 04/10/2007  . DISORDER, DYSMETABOLIC SYNDROME X 29/56/2130  . DISORDER, DEPRESSIVE NEC 04/10/2007  .  ELEVATED PROSTATE SPECIFIC ANTIGEN 04/10/2007  . History of non anemic vitamin B12 deficiency 10/26/2006  . ERECTILE DYSFUNCTION 10/26/2006  . Crohn's disease of small intestine with intestinal obstruction ([place]) 10/26/2006    Trenell Moxey W. 10/18/2017, 9:36 PM  Frazier Butt., PT   [facility] [address] [place],  [place], [address] Phone: [phone]   Fax:  [phone]  Name: Kyle Ball MRN: [id number] Date of Birth: [birth year]

## 2017-10-19 ENCOUNTER — Ambulatory Visit: Payer: Medicare Other | Admitting: Physical Therapy

## 2017-10-19 ENCOUNTER — Telehealth: Payer: Self-pay

## 2017-10-19 ENCOUNTER — Encounter: Payer: Self-pay | Admitting: Physical Therapy

## 2017-10-19 DIAGNOSIS — R2689 Other abnormalities of gait and mobility: Secondary | ICD-10-CM

## 2017-10-19 DIAGNOSIS — R293 Abnormal posture: Secondary | ICD-10-CM

## 2017-10-19 DIAGNOSIS — R29818 Other symptoms and signs involving the nervous system: Secondary | ICD-10-CM

## 2017-10-19 DIAGNOSIS — R2681 Unsteadiness on feet: Secondary | ICD-10-CM | POA: Diagnosis not present

## 2017-10-19 NOTE — Telephone Encounter (Signed)
Attempted to reach patient, mailbox is full and not accepting messages.

## 2017-10-20 NOTE — Therapy (Signed)
[facility] [address] [place], [place], [address] Phone: [phone]   Fax:  [phone]  Physical Therapy Treatment  Patient Details  Name: Kyle Ball MRN: [id number] Date of Birth: 1945/11/13 Referring Provider: Pura Spice   Encounter Date: 10/19/2017  PT End of Session - 10/20/17 0718    Visit Number  19    Number of Visits  24 per recert 02/16/34    Date for PT Re-Evaluation  11/16/17    Authorization Type  Medicare and BCBS    PT Start Time  1202 Pt arrives late for 1145 appt    PT Stop Time  1249    PT Time Calculation (min)  47 min    Activity Tolerance  Patient tolerated treatment well    Behavior During Therapy  [facility] for tasks assessed/performed       Past Medical History:  Diagnosis Date  . Anxiety and depression   . BPH (benign prostatic hypertrophy)   . Crohn's disease ([place]) 1979   After resection, 1 flare in 2012.  Marland Kitchen Dysmetabolic syndrome X   . ED (erectile dysfunction)   . Hyperlipidemia   . Internal hemorrhoids   . Parkinson's disease ([place])    [facility]    Past Surgical History:  Procedure Laterality Date  . COLONOSCOPY W/ BIOPSIES   06/2007, 2015   Crohn's, internal hemorrhoids, Dr Carlean Purl  . KNEE ARTHROSCOPY Right 06/2013  . PROSTATE BIOPSY     X 2, Dr Risa Grill  . PTNS Treatment     urinary incontinence  . QUADRICEPS REPAIR Right 1966    knee  . REFRACTIVE SURGERY  2015   Dr Bing Plume  . Terminal Ileum & Appendix Resected  1995   Crohn's    There were no vitals filed for this visit.  Subjective Assessment - 10/19/17 1204    Subjective  Dr. Linus Mako gave me the pitch about DBS (deep brain stimulator), he did increase my medication to Carbidopa-Levodopa 25-250.  They don't have it at the pharmacy yet.  (See Dr. Donna Christen note for details)    Patient is accompained by:  Family member Wife, [name] Pulse    Pertinent History  OSA, recent cardiac ultrasounds (awaiting results)    Patient Stated Goals  Pt's goals for therapy are to improve stooped posture, especially with walking    Currently in Pain?  Yes    Pain Onset  More than a month ago           LSVT ([facility]) -Maximal Daily Exercises   Floor to ceiling Other reps 8 reps-10 second holdwith 10 flicks at end range last rep   Side to side Other reps 8 reps-10 second holdwith 10 flicks at end range last rep   Step and Reach Forward 10 reps each sidewith coordinated arm movements with alternating legs   Step and Reach Backward 10 reps each sidewith coordinated arm movements with alternating legs   Step and Reach Sideways 10 reps each sidewith coordinated arm movements with alternating legs    Rock and Reach Forward/Backward 15 reps each side withreciprocalarm swing   Rock and Reach Sideways 15 reps each side with coordinated arm swing/reach   Functional Component Task:1 - Sit to stand Other reps (comment) 5reps each, from 20" mat,then 5 repsfrom 18" chairwith arms forward for momentum.    Task2 Standing rocking,10 reps to initiate gait for short distance/turns, multiple reps. Progressed to rocking and turning to fully "square up" to sit at [employer]  [facility] PT Treatment/Exercise - 10/20/17 0001      Ambulation/Gait   Ambulation/Gait  Yes    Ambulation/Gait Assistance  6: Modified independent (Device/Increase time);5: Supervision    Ambulation/Gait Assistance Details  Used clinic walking pole; cues for slowed gait pattern to sequence walking pole and arm swing for improved reciprocal pattern.    Ambulation Distance (Feet)  460 Feet then 230    Assistive device  -- single walking pole    Gait Pattern  Step-through pattern;Decreased arm swing - right;Decreased arm swing - left;Decreased step length - right;Right flexed knee in stance;Decreased trunk rotation;Trunk flexed;Poor foot clearance - left;Poor foot clearance - right     Ambulation Surface  Level;Indoor      PT provides tactile cues and manual assistance for reciprocal arm swing, cues for SLOWED BIG movements            PT Long Term Goals - 09/17/17 1315      PT LONG TERM GOAL #1   Title  Pt will be independent with Parkinson's specific HEP for improved transfers, gait and balance.  UPDATED TARGET 10/12/17    Time  4 per recert 07/19/76    Period  Weeks    Status  On-going    Target Date  10/12/17      PT LONG TERM GOAL #2   Title  Pt will improve 5x sit<>stand to less than or equal to 13 seconds  for improved transfer efficiency and safety.    Baseline  15.94 sec 09/17/17    Time  4 per recert 07/16/21    Period  Weeks    Status  On-going    Target Date  10/12/17      PT LONG TERM GOAL #3   Title  Pt will improve TUG score to less than or equal to 13.5 seconds for decreased fall risk.    Baseline  10.6    Time  4    Period  Weeks    Status  Achieved      PT LONG TERM GOAL #4   Title  Pt will improve MiniBESTest score to at least 24/28 for decreased fall risk.    Time  4 per recert 10/13/59    Period  Weeks    Status  On-going    Target Date  10/12/17      PT LONG TERM GOAL #5   Title  Pt will verbalize tips to reduce freezing episodes with gait and turns.    Time  4 per recert 09/13/29    Period  Weeks    Status  On-going    Target Date  10/12/17      PT LONG TERM GOAL #6   Title  Pt will verbalize understanding of posture/positioning/body mechanics for improved posture/decreased back pain.    Time  4 per recert 10/14/98    Period  Weeks    Status  On-going    Target Date  10/12/17      PT LONG TERM GOAL #7   Title  Pt will verbalize plans for continued community fitness upon D/C from PT.    Time  4 per recert 01/16/75    Period  Weeks    Status  On-going    Target Date  10/12/17            Plan - 10/20/17 0719    Clinical Impression Statement  Treatment session shortened today due to pt arriving>15 minutes later than  scheduled appointment.  Focused  on maximal daily exercises and gait training with single walking pole.  Pt's gait pattern slightly improves with slowed pattern and increased attention to reciprocal pattern.  Pt's final week in LSVT program is next week; pt does not apear to have made expected gains (he is not consistently performing exercises at home per instructions), and will need to discuss options for continued fitness and PD symptom management through exercise next week.    Rehab Potential  Good    PT Frequency  4x / week per recert 12/15/42    PT Duration  4 weeks    PT Treatment/Interventions  ADLs/Self Care Home Management;Gait training;Stair training;Functional mobility training;Therapeutic activities;Therapeutic exercise;Balance training;Neuromuscular re-education;Patient/family education    PT Next Visit Plan  Continue LSVT BIG maximal daily exercises, functional component tasks and hierarchy tasks; try gait with assistive device (rollator or UPwalker)    Consulted and Agree with Plan of Care  Patient;Family member/caregiver    Family Member Consulted  wife       Patient will benefit from skilled therapeutic intervention in order to improve the following deficits and impairments:  Abnormal gait, Decreased balance, Decreased mobility, Decreased strength, Difficulty walking, Impaired flexibility, Postural dysfunction, Impaired tone  Visit Diagnosis: Abnormal posture  Unsteadiness on feet  Other abnormalities of gait and mobility  Other symptoms and signs involving the nervous system     Problem List Patient Active Problem List   Diagnosis Date Noted  . Medicare annual wellness visit, subsequent 12/04/2016  . Obesity 12/04/2016  . Hyperglycemia 02/04/2015  . Elevated blood pressure reading without diagnosis of hypertension 03/05/2013  . Nonspecific abnormal electrocardiogram (ECG) (EKG) 08/31/2011  . Parkinson's disease ([place]) 06/21/2010  . ANXIETY 07/01/2007  . BPH (benign  prostatic hyperplasia) 07/01/2007  . HYPERLIPIDEMIA 04/10/2007  . DISORDER, DYSMETABOLIC SYNDROME X 92/06/69  . DISORDER, DEPRESSIVE NEC 04/10/2007  . ELEVATED PROSTATE SPECIFIC ANTIGEN 04/10/2007  . History of non anemic vitamin B12 deficiency 10/26/2006  . ERECTILE DYSFUNCTION 10/26/2006  . Crohn's disease of small intestine with intestinal obstruction ([place]) 10/26/2006    MARRIOTT,AMY W. 10/20/2017, 7:23 AM  Frazier Butt., PT   [facility] [address] [place], [place], [address] Phone: [phone]   Fax:  [phone]  Name: Kyle Ball MRN: [id number] Date of Birth: Dec 20, 1945

## 2017-10-22 ENCOUNTER — Ambulatory Visit: Payer: Medicare Other | Admitting: Physical Therapy

## 2017-10-22 ENCOUNTER — Encounter: Payer: Self-pay | Admitting: Physical Therapy

## 2017-10-22 DIAGNOSIS — R2689 Other abnormalities of gait and mobility: Secondary | ICD-10-CM | POA: Diagnosis not present

## 2017-10-22 DIAGNOSIS — R2681 Unsteadiness on feet: Secondary | ICD-10-CM

## 2017-10-22 DIAGNOSIS — R293 Abnormal posture: Secondary | ICD-10-CM

## 2017-10-22 DIAGNOSIS — R29818 Other symptoms and signs involving the nervous system: Secondary | ICD-10-CM | POA: Diagnosis not present

## 2017-10-22 NOTE — Telephone Encounter (Signed)
Mailbox is still full.  Unable to leave a message.

## 2017-10-23 ENCOUNTER — Encounter: Payer: Self-pay | Admitting: Physical Therapy

## 2017-10-23 ENCOUNTER — Ambulatory Visit: Payer: Medicare Other | Admitting: Physical Therapy

## 2017-10-23 DIAGNOSIS — R2681 Unsteadiness on feet: Secondary | ICD-10-CM

## 2017-10-23 DIAGNOSIS — R2689 Other abnormalities of gait and mobility: Secondary | ICD-10-CM

## 2017-10-23 DIAGNOSIS — R29818 Other symptoms and signs involving the nervous system: Secondary | ICD-10-CM | POA: Diagnosis not present

## 2017-10-23 DIAGNOSIS — R293 Abnormal posture: Secondary | ICD-10-CM | POA: Diagnosis not present

## 2017-10-23 NOTE — Therapy (Signed)
[facility] [address] [place], [place], [address] Phone: [phone]   Fax:  [phone]  Physical Therapy Treatment  Patient Details  Name: Kyle Ball MRN: [id number] Date of Birth: 1945-07-10 Referring Provider: Pura Spice   Encounter Date: 10/23/2017  PT End of Session - 10/23/17 2053    Visit Number  21    Number of Visits  24 per recert 0/8/67    Date for PT Re-Evaluation  11/16/17    Authorization Type  Medicare and BCBS    PT Start Time  1149    PT Stop Time  1247    PT Time Calculation (min)  58 min    Activity Tolerance  Patient tolerated treatment well    Behavior During Therapy  [facility] for tasks assessed/performed       Past Medical History:  Diagnosis Date  . Anxiety and depression   . BPH (benign prostatic hypertrophy)   . Crohn's disease ([place]) 1979   After resection, 1 flare in 2012.  Marland Kitchen Dysmetabolic syndrome X   . ED (erectile dysfunction)   . Hyperlipidemia   . Internal hemorrhoids   . Parkinson's disease ([place])    [facility]    Past Surgical History:  Procedure Laterality Date  . COLONOSCOPY W/ BIOPSIES   06/2007, 2015   Crohn's, internal hemorrhoids, Dr Carlean Purl  . KNEE ARTHROSCOPY Right 06/2013  . PROSTATE BIOPSY     X 2, Dr Risa Grill  . PTNS Treatment     urinary incontinence  . QUADRICEPS REPAIR Right 1966    knee  . REFRACTIVE SURGERY  2015   Dr Bing Plume  . Terminal Ileum & Appendix Resected  1995   Crohn's    There were no vitals filed for this visit.  Subjective Assessment - 10/23/17 1151    Subjective  Felt great last evening and felt good this morning upon waking up (that's different than normal).  Did all the exercises last night.    Patient is accompained by:  Family member Wife, Kyle Ball    Pertinent History  OSA, recent cardiac ultrasounds (awaiting results)    Patient Stated Goals  Pt's goals for therapy are to improve stooped posture, especially with walking     Currently in Pain?  Yes    Pain Score  4     Pain Location  Back    Pain Orientation  Right;Left;Lower    Pain Descriptors / Indicators  Aching    Pain Type  Chronic pain    Pain Onset  More than a month ago    Pain Frequency  Intermittent    Aggravating Factors   bending, straining movements    Pain Relieving Factors  sitting to rest, medication           LSVT ([facility]) -Maximal Daily Exercises   Floor to ceiling Other reps 8 reps-10 second hold, with 10 UE flicks last rep   Side to side Other reps 8 reps-10 second hold with 10 UE flicks last rep   Step and Reach Forward 10 reps each sidewith coordinated arm movements with alternating legs   Step and Reach Backward 10 reps each sidewith coordinated arm movements with alternating legs   Step and Reach Sideways 10 reps each sidewith coordinated arm movements with alternating legs    Rock and Reach Forward/Backward 15 reps each side withreciprocalarm swing   Rock and Reach Sideways 10 reps each side with coordinated arm swing/reach   Functional Component Task:1 -  Sit to stand Other reps (comment) 5reps each, from 20" mat,then 5 repsfrom 18" chairwith arms forward for momentum.    Task2 Standing rocking,10 reps to initiate gait for short distance/turns, multiple reps.  Progressed to rocking and turning to fully "square up" to sit at chair/mat   HierarchyTask 1 Full 360 degree turns to R and to L,1 rep each direction, with cues for BIG Rock to turn; progress to short distance gait with rock and turn to change directions, 3 reps   HierarchyTask 2 Sidestepping over hurdler, to simulate tub-practiced stepping over 4 reps-cues for BIG step over and BIG second step over to balance-progressed from using UE support at counter, to no UE support    Hierarchy Task 3Stair negotiation 4 reps, with intermittent use of rail, alternating steps, cues for foot placement, foot clearance with BIG  effort.    Practiced sitting at table with upright BIG posture, with cues for BIG deliberate movement for eating tasks.  Cues to "hinge" forward at hips to get closer to plate.  (in response to wife's concerns that he leans forward into plate versus bringing food up to his mouth.), Repeated 2 reps  Practiced simulation of storm door/main front door with threshold, with BIG step in and BIG push to open main door.  Pt performs maximal daily exercises with minimal verbal and visual cues.                           [facility] Adult PT Treatment/Exercise - 10/23/17 1200      Ambulation/Gait   Ambulation/Gait  Yes    Ambulation/Gait Assistance  6: Modified independent (Device/Increase time);5: Supervision    Ambulation/Gait Assistance Details  Used clinic's single walking pole for outdoor gait activities    Ambulation Distance (Feet)  800 Feet    Assistive device  -- single walking pole    Gait Pattern  Step-through pattern;Decreased arm swing - right;Decreased arm swing - left;Decreased step length - right;Right flexed knee in stance;Decreased trunk rotation;Trunk flexed;Poor foot clearance - left;Poor foot clearance - right Improved foot clearance with cues    Ambulation Surface  Level;Indoor                  PT Long Term Goals - 09/17/17 1315      PT LONG TERM GOAL #1   Title  Pt will be independent with Parkinson's specific HEP for improved transfers, gait and balance.  UPDATED TARGET 10/12/17    Time  4 per recert 06/17/08    Period  Weeks    Status  On-going    Target Date  10/12/17      PT LONG TERM GOAL #2   Title  Pt will improve 5x sit<>stand to less than or equal to 13 seconds  for improved transfer efficiency and safety.    Baseline  15.94 sec 09/17/17    Time  4 per recert 02/16/03    Period  Weeks    Status  On-going    Target Date  10/12/17      PT LONG TERM GOAL #3   Title  Pt will improve TUG score to less than or equal to 13.5 seconds for  decreased fall risk.    Baseline  10.6    Time  4    Period  Weeks    Status  Achieved      PT LONG TERM GOAL #4   Title  Pt will improve MiniBESTest score  to at least 24/28 for decreased fall risk.    Time  4 per recert 12/12/07    Period  Weeks    Status  On-going    Target Date  10/12/17      PT LONG TERM GOAL #5   Title  Pt will verbalize tips to reduce freezing episodes with gait and turns.    Time  4 per recert 09/17/07    Period  Weeks    Status  On-going    Target Date  10/12/17      PT LONG TERM GOAL #6   Title  Pt will verbalize understanding of posture/positioning/body mechanics for improved posture/decreased back pain.    Time  4 per recert 11/11/81    Period  Weeks    Status  On-going    Target Date  10/12/17      PT LONG TERM GOAL #7   Title  Pt will verbalize plans for continued community fitness upon D/C from PT.    Time  4 per recert 11/16/27    Period  Weeks    Status  On-going    Target Date  10/12/17            Plan - 10/23/17 2053    Clinical Impression Statement  In addition to maximal daily exercises and functional/hierarchy tasks, performed gait on outdoor surfaces using single walking pole.  Pt does well with outdoor gait and PT recommends that he needs to begin walking with wife outdoor distances at home.    Rehab Potential  Good    PT Frequency  4x / week per recert 09/17/63    PT Duration  4 weeks    PT Treatment/Interventions  ADLs/Self Care Home Management;Gait training;Stair training;Functional mobility training;Therapeutic activities;Therapeutic exercise;Balance training;Neuromuscular re-education;Patient/family education    PT Next Visit Plan  Continue LSVT BIG maximal daily exercises, functional component tasks and hierarchy tasks; discuss d/c plans for later this week    Consulted and Agree with Plan of Care  Patient    Family Member Consulted          Patient will benefit from skilled therapeutic intervention in order to improve the  following deficits and impairments:  Abnormal gait, Decreased balance, Decreased mobility, Decreased strength, Difficulty walking, Impaired flexibility, Postural dysfunction, Impaired tone  Visit Diagnosis: Abnormal posture  Unsteadiness on feet  Other abnormalities of gait and mobility     Problem List Patient Active Problem List   Diagnosis Date Noted  . Medicare annual wellness visit, subsequent 12/04/2016  . Obesity 12/04/2016  . Hyperglycemia 02/04/2015  . Elevated blood pressure reading without diagnosis of hypertension 03/05/2013  . Nonspecific abnormal electrocardiogram (ECG) (EKG) 08/31/2011  . Parkinson's disease ([place]) 06/21/2010  . ANXIETY 07/01/2007  . BPH (benign prostatic hyperplasia) 07/01/2007  . HYPERLIPIDEMIA 04/10/2007  . DISORDER, DYSMETABOLIC SYNDROME X 46/50/3546  . DISORDER, DEPRESSIVE NEC 04/10/2007  . ELEVATED PROSTATE SPECIFIC ANTIGEN 04/10/2007  . History of non anemic vitamin B12 deficiency 10/26/2006  . ERECTILE DYSFUNCTION 10/26/2006  . Crohn's disease of small intestine with intestinal obstruction ([place]) 10/26/2006    Deshay Blumenfeld W. 10/23/2017, 8:57 PM  Frazier Butt., PT   [facility] [address] [place], [place], [address] Phone: [phone]   Fax:  [phone]  Name: Kyle Ball MRN: [id number] Date of Birth: [birth year]

## 2017-10-23 NOTE — Telephone Encounter (Signed)
Patient reports diarrhea is still sporadic every other day at best.  No rectal bleeding, no abdominal pain or cramping. He has completed prednisone.  His Cimzia was denied for in office injection and the cost will be too high for him to inject himself at home. Please advise next step.  He has a follow up appt with you in July.  Do you want to try and prescribe it ? he believes he and his wife can do the in home injection.

## 2017-10-23 NOTE — Telephone Encounter (Signed)
I want to Rx a biologic agent - so see if he can afford Cimzia at home or we will figure out a different biologic

## 2017-10-23 NOTE — Therapy (Signed)
[facility] [address] [place], [place], [address] Phone: [phone]   Fax:  [phone]  Physical Therapy Treatment  Patient Details  Name: Kyle Ball MRN: [id number] Date of Birth: December 15, 1945 Referring Provider: Pura Spice   Encounter Date: 10/22/2017  PT End of Session - 10/23/17 0928    Visit Number  20    Number of Visits  24 per recert 11/12/67    Date for PT Re-Evaluation  11/16/17    Authorization Type  Medicare and BCBS    PT Start Time  1104    PT Stop Time  1202    PT Time Calculation (min)  58 min    Activity Tolerance  Patient tolerated treatment well    Behavior During Therapy  [facility] for tasks assessed/performed       Past Medical History:  Diagnosis Date  . Anxiety and depression   . BPH (benign prostatic hypertrophy)   . Crohn's disease ([place]) 1979   After resection, 1 flare in 2012.  Marland Kitchen Dysmetabolic syndrome X   . ED (erectile dysfunction)   . Hyperlipidemia   . Internal hemorrhoids   . Parkinson's disease ([place])    [facility]    Past Surgical History:  Procedure Laterality Date  . COLONOSCOPY W/ BIOPSIES   06/2007, 2015   Crohn's, internal hemorrhoids, Dr Carlean Purl  . KNEE ARTHROSCOPY Right 06/2013  . PROSTATE BIOPSY     X 2, Dr Risa Grill  . PTNS Treatment     urinary incontinence  . QUADRICEPS REPAIR Right 1966    knee  . REFRACTIVE SURGERY  2015   Dr Bing Plume  . Terminal Ileum & Appendix Resected  1995   Crohn's    There were no vitals filed for this visit.  Subjective Assessment - 10/22/17 1105    Subjective  Did the exercises a couple of times; no falls.  Picked up the new dose of the medication yesterday.  I have had 3 doses, and I seem to feel good today.    Patient is accompained by:  Family member Wife, [name] Pulse    Pertinent History  OSA, recent cardiac ultrasounds (awaiting results)    Patient Stated Goals  Pt's goals for therapy are to improve stooped posture,  especially with walking    Currently in Pain?  Yes    Pain Score  4     Pain Location  Back    Pain Orientation  Right;Left;Lower    Pain Descriptors / Indicators  Aching    Pain Type  Chronic pain    Pain Onset  More than a month ago    Pain Frequency  Intermittent    Aggravating Factors   bending, straining movements    Pain Relieving Factors  sitting to rest, medication       With maximal daily exercises this visit:  Pt performs exercises with verbal cues only from PT, very minimal visual cues from PT, in order to work on pt's improved carryover for exercises for home.   LSVT ([facility]) -Maximal Daily Exercises   Floor to ceiling Other reps 8 reps-10 second holdwith 10 flicks at end range last rep   Side to side Other reps 8 reps-10 second holdwith 10 flicks at end range last rep   Step and Reach Forward 10 reps each sidewith coordinated arm movements with alternating legs   Step and Reach Backward 10 reps each sidewith coordinated arm movements with alternating legs   Step and Reach Sideways  10 reps each sidewith coordinated arm movements with alternating legs    Rock and Reach Forward/Backward 15 reps each side withreciprocalarm swing   Rock and Reach Sideways 15 reps each side with coordinated arm swing/reach   Functional Component Task:1 - Sit to stand Other reps (comment) 5reps each, from 20" mat,then 5 repsfrom 18" chairwith arms forward for momentum.    Task2 Standing rocking,10 reps to initiate gait for short distance/turns, multiple reps. Progressed to rocking and turning to fully "square up" to sit at chair/mat, performed several times during PT session    Did not perform additional tasks today, as pt has improved fluidity of movement, but SLOWED movements with maximal daily exercises, despite cues.  Also, focused on trial of assistive devices with gait.                        [facility] PT Treatment/Exercise -  10/23/17 0001      Ambulation/Gait   Ambulation/Gait  Yes    Ambulation/Gait Assistance  6: Modified independent (Device/Increase time);5: Supervision    Ambulation/Gait Assistance Details  Trialed various assistive devices per pt request last visit:  rollator x 230 ft, UPwalker x 115 ft, then single walking pole x 460 ft    Ambulation Distance (Feet)  -- see above    Assistive device  4-wheeled walker UPWalker, single walking pole    Gait Pattern  Step-through pattern;Decreased arm swing - right;Decreased arm swing - left;Decreased step length - right;Right flexed knee in stance;Decreased trunk rotation;Trunk flexed;Poor foot clearance - left;Poor foot clearance - right    Ambulation Surface  Level;Indoor    Gait Comments  Trialed rollator walker, with improved posture and improved step length, but pt feels that it could get too far in front of him at any time; trialed UP Walker, which improved posture initially, but it appears that he is actually more forward flexed, leaning upper body on UE supports.  Gait training with single walking pole, with improved posture and improved step length, and pt/PT feel walking pole is the least restrictive, most appropriate device; recommended again that patient use walking pole at all times, even with household gait (pt has not been bringing in walking pole to therapy sessions or reportedly not using at home)                  PT Long Term Goals - 09/17/17 1315      PT LONG TERM GOAL #1   Title  Pt will be independent with Parkinson's specific HEP for improved transfers, gait and balance.  UPDATED TARGET 10/12/17    Time  4 per recert 0/8/67    Period  Weeks    Status  On-going    Target Date  10/12/17      PT LONG TERM GOAL #2   Title  Pt will improve 5x sit<>stand to less than or equal to 13 seconds  for improved transfer efficiency and safety.    Baseline  15.94 sec 09/17/17    Time  4 per recert 11/10/93    Period  Weeks    Status  On-going     Target Date  10/12/17      PT LONG TERM GOAL #3   Title  Pt will improve TUG score to less than or equal to 13.5 seconds for decreased fall risk.    Baseline  10.6    Time  4    Period  Weeks  Status  Achieved      PT LONG TERM GOAL #4   Title  Pt will improve MiniBESTest score to at least 24/28 for decreased fall risk.    Time  4 per recert 07/16/21    Period  Weeks    Status  On-going    Target Date  10/12/17      PT LONG TERM GOAL #5   Title  Pt will verbalize tips to reduce freezing episodes with gait and turns.    Time  4 per recert 10/13/59    Period  Weeks    Status  On-going    Target Date  10/12/17      PT LONG TERM GOAL #6   Title  Pt will verbalize understanding of posture/positioning/body mechanics for improved posture/decreased back pain.    Time  4 per recert 09/13/29    Period  Weeks    Status  On-going    Target Date  10/12/17      PT LONG TERM GOAL #7   Title  Pt will verbalize plans for continued community fitness upon D/C from PT.    Time  4 per recert 10/14/98    Period  Weeks    Status  On-going    Target Date  10/12/17            Plan - 10/23/17 8676    Clinical Impression Statement  Pt has had increase in Sinemet dosage, which he has begun taking since last visit.  With today's exercises he seems to have more fluid movement patterns, but still slowed movements overall.  Trialed various walking devices, today including rollator and UP walker, with general feeling that while these do improve his posture in the short term, he appears to rely on them heavily, and concern is that he would push them too far forward, creating unsafe gait pattern.    Rehab Potential  Good    PT Frequency  4x / week per recert 06/20/48    PT Duration  4 weeks    PT Treatment/Interventions  ADLs/Self Care Home Management;Gait training;Stair training;Functional mobility training;Therapeutic activities;Therapeutic exercise;Balance training;Neuromuscular  re-education;Patient/family education    PT Next Visit Plan  Continue LSVT BIG maximal daily exercises, functional component tasks and hierarchy tasks; discuss d/c plans for later this week    Consulted and Agree with Plan of Care  Patient;Family member/caregiver    Family Member Consulted  wife       Patient will benefit from skilled therapeutic intervention in order to improve the following deficits and impairments:  Abnormal gait, Decreased balance, Decreased mobility, Decreased strength, Difficulty walking, Impaired flexibility, Postural dysfunction, Impaired tone  Visit Diagnosis: Abnormal posture  Unsteadiness on feet  Other abnormalities of gait and mobility     Problem List Patient Active Problem List   Diagnosis Date Noted  . Medicare annual wellness visit, subsequent 12/04/2016  . Obesity 12/04/2016  . Hyperglycemia 02/04/2015  . Elevated blood pressure reading without diagnosis of hypertension 03/05/2013  . Nonspecific abnormal electrocardiogram (ECG) (EKG) 08/31/2011  . Parkinson's disease ([place]) 06/21/2010  . ANXIETY 07/01/2007  . BPH (benign prostatic hyperplasia) 07/01/2007  . HYPERLIPIDEMIA 04/10/2007  . DISORDER, DYSMETABOLIC SYNDROME X 93/26/7124  . DISORDER, DEPRESSIVE NEC 04/10/2007  . ELEVATED PROSTATE SPECIFIC ANTIGEN 04/10/2007  . History of non anemic vitamin B12 deficiency 10/26/2006  . ERECTILE DYSFUNCTION 10/26/2006  . Crohn's disease of small intestine with intestinal obstruction ([place]) 10/26/2006    Danyael Alipio W. 10/23/2017, 10:59 AM  [facility]  [facility] [address] [place], [place], [address] Phone: [phone]   Fax:  [phone]  Name: NIJEE HEATWOLE MRN: [id number] Date of Birth: Feb 17, 1946

## 2017-10-24 ENCOUNTER — Ambulatory Visit: Payer: Medicare Other | Admitting: Physical Therapy

## 2017-10-24 DIAGNOSIS — N3946 Mixed incontinence: Secondary | ICD-10-CM | POA: Diagnosis not present

## 2017-10-25 ENCOUNTER — Ambulatory Visit: Payer: Medicare Other | Admitting: Physical Therapy

## 2017-10-25 ENCOUNTER — Telehealth: Payer: Self-pay | Admitting: Physical Therapy

## 2017-10-25 DIAGNOSIS — N130 Hydronephrosis with ureteropelvic junction obstruction: Secondary | ICD-10-CM | POA: Diagnosis not present

## 2017-10-25 DIAGNOSIS — R338 Other retention of urine: Secondary | ICD-10-CM | POA: Diagnosis not present

## 2017-10-25 NOTE — Telephone Encounter (Signed)
Pt has cancelled PT visit on 10/24/17 and 10/25/17 (last scheduled PT visit) due to urology/kidney issues.  Requested call back to discuss plan.  PT phone patient and left message.

## 2017-10-27 ENCOUNTER — Encounter (HOSPITAL_COMMUNITY): Payer: Self-pay | Admitting: Emergency Medicine

## 2017-10-27 ENCOUNTER — Other Ambulatory Visit: Payer: Self-pay

## 2017-10-27 ENCOUNTER — Inpatient Hospital Stay (HOSPITAL_COMMUNITY)
Admission: EM | Admit: 2017-10-27 | Discharge: 2017-11-01 | DRG: 690 | Disposition: A | Discharge status: Skilled Nursing Facility | Payer: Medicare Other | Attending: Family Medicine | Admitting: Family Medicine

## 2017-10-27 ENCOUNTER — Other Ambulatory Visit: Payer: Self-pay | Admitting: Family Medicine

## 2017-10-27 ENCOUNTER — Emergency Department (HOSPITAL_COMMUNITY): Payer: Medicare Other

## 2017-10-27 DIAGNOSIS — N401 Enlarged prostate with lower urinary tract symptoms: Secondary | ICD-10-CM | POA: Diagnosis present

## 2017-10-27 DIAGNOSIS — N400 Enlarged prostate without lower urinary tract symptoms: Secondary | ICD-10-CM | POA: Diagnosis present

## 2017-10-27 DIAGNOSIS — E860 Dehydration: Secondary | ICD-10-CM | POA: Diagnosis present

## 2017-10-27 DIAGNOSIS — Z9049 Acquired absence of other specified parts of digestive tract: Secondary | ICD-10-CM

## 2017-10-27 DIAGNOSIS — G20 Parkinson's disease: Secondary | ICD-10-CM | POA: Diagnosis not present

## 2017-10-27 DIAGNOSIS — R031 Nonspecific low blood-pressure reading: Secondary | ICD-10-CM | POA: Diagnosis not present

## 2017-10-27 DIAGNOSIS — K50012 Crohn's disease of small intestine with intestinal obstruction: Secondary | ICD-10-CM | POA: Diagnosis present

## 2017-10-27 DIAGNOSIS — R5383 Other fatigue: Secondary | ICD-10-CM | POA: Diagnosis not present

## 2017-10-27 DIAGNOSIS — R9431 Abnormal electrocardiogram [ECG] [EKG]: Secondary | ICD-10-CM | POA: Diagnosis present

## 2017-10-27 DIAGNOSIS — R531 Weakness: Secondary | ICD-10-CM

## 2017-10-27 DIAGNOSIS — F329 Major depressive disorder, single episode, unspecified: Secondary | ICD-10-CM | POA: Diagnosis present

## 2017-10-27 DIAGNOSIS — N390 Urinary tract infection, site not specified: Secondary | ICD-10-CM | POA: Diagnosis not present

## 2017-10-27 DIAGNOSIS — R310 Gross hematuria: Secondary | ICD-10-CM | POA: Diagnosis present

## 2017-10-27 DIAGNOSIS — R296 Repeated falls: Secondary | ICD-10-CM | POA: Diagnosis not present

## 2017-10-27 DIAGNOSIS — G20A1 Parkinson's disease without dyskinesia, without mention of fluctuations: Secondary | ICD-10-CM | POA: Diagnosis present

## 2017-10-27 DIAGNOSIS — E876 Hypokalemia: Secondary | ICD-10-CM | POA: Diagnosis present

## 2017-10-27 DIAGNOSIS — M545 Low back pain, unspecified: Secondary | ICD-10-CM

## 2017-10-27 DIAGNOSIS — E782 Mixed hyperlipidemia: Secondary | ICD-10-CM | POA: Diagnosis present

## 2017-10-27 DIAGNOSIS — R338 Other retention of urine: Secondary | ICD-10-CM | POA: Diagnosis present

## 2017-10-27 DIAGNOSIS — E785 Hyperlipidemia, unspecified: Secondary | ICD-10-CM | POA: Diagnosis not present

## 2017-10-27 DIAGNOSIS — R319 Hematuria, unspecified: Secondary | ICD-10-CM | POA: Diagnosis not present

## 2017-10-27 DIAGNOSIS — E8881 Metabolic syndrome: Secondary | ICD-10-CM | POA: Diagnosis present

## 2017-10-27 DIAGNOSIS — R109 Unspecified abdominal pain: Secondary | ICD-10-CM | POA: Diagnosis not present

## 2017-10-27 DIAGNOSIS — Z66 Do not resuscitate: Secondary | ICD-10-CM | POA: Diagnosis present

## 2017-10-27 DIAGNOSIS — Z7982 Long term (current) use of aspirin: Secondary | ICD-10-CM

## 2017-10-27 DIAGNOSIS — Z79899 Other long term (current) drug therapy: Secondary | ICD-10-CM

## 2017-10-27 LAB — CBC WITH DIFFERENTIAL/PLATELET
Basophils Absolute: 0 10*3/uL (ref 0.0–0.1)
Basophils Relative: 0 %
EOS ABS: 0.1 10*3/uL (ref 0.0–0.7)
EOS PCT: 2 %
HCT: 43.8 % (ref 39.0–52.0)
HEMOGLOBIN: 14.5 g/dL (ref 13.0–17.0)
LYMPHS ABS: 1.2 10*3/uL (ref 0.7–4.0)
Lymphocytes Relative: 18 %
MCH: 28 pg (ref 26.0–34.0)
MCHC: 33.1 g/dL (ref 30.0–36.0)
MCV: 84.7 fL (ref 78.0–100.0)
Monocytes Absolute: 0.7 10*3/uL (ref 0.1–1.0)
Monocytes Relative: 10 %
Neutro Abs: 4.8 10*3/uL (ref 1.7–7.7)
Neutrophils Relative %: 70 %
Platelets: 178 10*3/uL (ref 150–400)
RBC: 5.17 MIL/uL (ref 4.22–5.81)
RDW: 13.8 % (ref 11.5–15.5)
WBC: 6.8 10*3/uL (ref 4.0–10.5)

## 2017-10-27 LAB — URINALYSIS, ROUTINE W REFLEX MICROSCOPIC
Bilirubin Urine: NEGATIVE
GLUCOSE, UA: NEGATIVE mg/dL
KETONES UR: 5 mg/dL — AB
NITRITE: NEGATIVE
PROTEIN: 30 mg/dL — AB
RBC / HPF: >50 RBC/hpf — ABNORMAL HIGH (ref 0–5)
Specific Gravity, Urine: 1.016 (ref 1.005–1.030)
pH: 5 (ref 5.0–8.0)

## 2017-10-27 LAB — COMPREHENSIVE METABOLIC PANEL
ALBUMIN: 3.7 g/dL (ref 3.5–5.0)
ALT: 8 U/L — ABNORMAL LOW (ref 17–63)
ANION GAP: 11 (ref 5–15)
AST: 12 U/L — AB (ref 15–41)
Alkaline Phosphatase: 64 U/L (ref 38–126)
BUN: 14 mg/dL (ref 6–20)
CO2: 25 mmol/L (ref 22–32)
Calcium: 9.1 mg/dL (ref 8.9–10.3)
Chloride: 103 mmol/L (ref 101–111)
Creatinine, Ser: 0.97 mg/dL (ref 0.61–1.24)
GFR calc Af Amer: >60 mL/min (ref >60)
GFR calc non Af Amer: >60 mL/min (ref >60)
GLUCOSE: 95 mg/dL (ref 65–99)
POTASSIUM: 3.4 mmol/L — AB (ref 3.5–5.1)
SODIUM: 139 mmol/L (ref 135–145)
Total Bilirubin: 0.9 mg/dL (ref 0.3–1.2)
Total Protein: 7 g/dL (ref 6.5–8.1)

## 2017-10-27 LAB — LIPASE, BLOOD: Lipase: 25 U/L (ref 11–51)

## 2017-10-27 MED ORDER — ACETAMINOPHEN 325 MG PO TABS: 650.0000 mg | ORAL_TABLET | Freq: Four times a day (QID) | ORAL | Status: DC | PRN

## 2017-10-27 MED ORDER — CEFTRIAXONE SODIUM 2 G IJ SOLR
2.0000 g | INTRAMUSCULAR | Status: DC
  Administered 2017-10-28 – 2017-11-01 (×5): 2 g via INTRAVENOUS
  Filled 2017-10-27 (×5): qty 2

## 2017-10-27 MED ORDER — CEFTRIAXONE SODIUM 1 G IJ SOLR
1.0000 g | Freq: Once | INTRAMUSCULAR | Status: AC
  Administered 2017-10-27: 1 g via INTRAVENOUS
  Filled 2017-10-27: qty 10

## 2017-10-27 MED ORDER — SODIUM CHLORIDE 0.9 % IV SOLN: 1.0000 g | Freq: Once | INTRAVENOUS | Status: DC

## 2017-10-27 MED ORDER — ASPIRIN 81 MG PO CHEW
81.0000 mg | CHEWABLE_TABLET | Freq: Every day | ORAL | Status: DC
  Administered 2017-10-27 – 2017-10-31 (×5): 81 mg via ORAL
  Filled 2017-10-27 (×5): qty 1

## 2017-10-27 MED ORDER — IOPAMIDOL (ISOVUE-300) INJECTION 61%
100.0000 mL | Freq: Once | INTRAVENOUS | Status: AC | PRN
  Administered 2017-10-27: 100 mL via INTRAVENOUS

## 2017-10-27 MED ORDER — CITALOPRAM HYDROBROMIDE 20 MG PO TABS
30.0000 mg | ORAL_TABLET | Freq: Every day | ORAL | Status: DC
  Administered 2017-10-27 – 2017-10-31 (×5): 30 mg via ORAL
  Filled 2017-10-27 (×6): qty 2

## 2017-10-27 MED ORDER — CARBIDOPA-LEVODOPA 25-250 MG PO TABS
1.0000 | ORAL_TABLET | Freq: Three times a day (TID) | ORAL | Status: DC
  Administered 2017-10-27 – 2017-11-01 (×15): 1 via ORAL
  Filled 2017-10-27 (×17): qty 1

## 2017-10-27 MED ORDER — SODIUM CHLORIDE 0.9 % IV BOLUS
500.0000 mL | Freq: Once | INTRAVENOUS | Status: AC
  Administered 2017-10-27: 500 mL via INTRAVENOUS

## 2017-10-27 MED ORDER — TRAMADOL HCL 50 MG PO TABS: 50.0000 mg | ORAL_TABLET | Freq: Four times a day (QID) | ORAL | Status: DC | PRN

## 2017-10-27 MED ORDER — ACETAMINOPHEN 650 MG RE SUPP
650.0000 mg | Freq: Four times a day (QID) | RECTAL | Status: DC | PRN
Start: 2017-10-27 — End: 2017-11-02

## 2017-10-27 MED ORDER — SODIUM CHLORIDE 0.9 % IV SOLN
INTRAVENOUS | Status: AC
  Administered 2017-10-27 – 2017-10-28 (×2): via INTRAVENOUS

## 2017-10-27 MED ORDER — ONDANSETRON HCL 4 MG/2ML IJ SOLN
4.0000 mg | Freq: Four times a day (QID) | INTRAMUSCULAR | Status: DC | PRN
Start: 2017-10-27 — End: 2017-11-02

## 2017-10-27 MED ORDER — ENOXAPARIN SODIUM 40 MG/0.4ML ~~LOC~~ SOLN
40.0000 mg | SUBCUTANEOUS | Status: DC
  Administered 2017-10-27 – 2017-10-31 (×5): 40 mg via SUBCUTANEOUS
  Filled 2017-10-27 (×5): qty 0.4

## 2017-10-27 MED ORDER — TAMSULOSIN HCL 0.4 MG PO CAPS
0.4000 mg | ORAL_CAPSULE | Freq: Every day | ORAL | Status: DC
  Administered 2017-10-27 – 2017-11-01 (×6): 0.4 mg via ORAL
  Filled 2017-10-27 (×5): qty 1

## 2017-10-27 MED ORDER — DUTASTERIDE 0.5 MG PO CAPS
0.5000 mg | ORAL_CAPSULE | Freq: Every day | ORAL | Status: DC
  Administered 2017-10-27 – 2017-11-01 (×6): 0.5 mg via ORAL
  Filled 2017-10-27 (×7): qty 1

## 2017-10-27 MED ORDER — IOPAMIDOL (ISOVUE-300) INJECTION 61%
INTRAVENOUS | Status: AC
  Filled 2017-10-27: qty 100

## 2017-10-27 MED ORDER — ONDANSETRON HCL 4 MG PO TABS: 4.0000 mg | ORAL_TABLET | Freq: Four times a day (QID) | ORAL | Status: DC | PRN

## 2017-10-27 MED ORDER — POLYETHYLENE GLYCOL 3350 17 G PO PACK: 17.0000 g | PACK | Freq: Every day | ORAL | Status: DC | PRN

## 2017-10-27 NOTE — ED Provider Notes (Signed)
[facility] DEPT Provider Note   CSN: [id number] Arrival date & time: 10/27/17  1200     History   Chief Complaint Chief Complaint  Patient presents with  . Fall  . Weakness    HPI Kyle Ball is a 72 y.o. male.  HPI Patient with history of Parkinson's disease and Crohn's disease was seen by his urologist on Thursday for urinary retention.  Had Foley catheter placed.  Since that time patient has had increased generalized weakness and 2 ground-level falls.  Denies head injury or loss of consciousness.  He had episodic mild nausea but no vomiting.  Patient states he was having left flank pain and abdominal pain.  The abdominal pain is improved but continues to have the left flank pain.  Urine is dark in Foley bag. Past Medical History:  Diagnosis Date  . Anxiety and depression   . BPH (benign prostatic hypertrophy)   . Crohn's disease ([place]) 1979   After resection, 1 flare in 2012.  Marland Kitchen Dysmetabolic syndrome X   . ED (erectile dysfunction)   . Hyperlipidemia   . Internal hemorrhoids   . Parkinson's disease [facility])    [facility]    Patient Active Problem List   Diagnosis Date Noted  . Medicare annual wellness visit, subsequent 12/04/2016  . Obesity 12/04/2016  . Hyperglycemia 02/04/2015  . Elevated blood pressure reading without diagnosis of hypertension 03/05/2013  . Nonspecific abnormal electrocardiogram (ECG) (EKG) 08/31/2011  . Parkinson's disease ([place]) 06/21/2010  . ANXIETY 07/01/2007  . BPH (benign prostatic hyperplasia) 07/01/2007  . HYPERLIPIDEMIA 04/10/2007  . DISORDER, DYSMETABOLIC SYNDROME X 57/32/2025  . DISORDER, DEPRESSIVE NEC 04/10/2007  . ELEVATED PROSTATE SPECIFIC ANTIGEN 04/10/2007  . History of non anemic vitamin B12 deficiency 10/26/2006  . ERECTILE DYSFUNCTION 10/26/2006  . Crohn's disease of small intestine with intestinal obstruction ([place]) 10/26/2006    Past Surgical History:  Procedure Laterality Date  .  COLONOSCOPY W/ BIOPSIES   06/2007, 2015   Crohn's, internal hemorrhoids, Dr Carlean Purl  . KNEE ARTHROSCOPY Right 06/2013  . PROSTATE BIOPSY     X 2, Dr Risa Grill  . PTNS Treatment     urinary incontinence  . QUADRICEPS REPAIR Right 1966    knee  . REFRACTIVE SURGERY  2015   Dr Bing Plume  . Terminal Ileum & Appendix Resected  1995   Crohn's        Home Medications    Prior to Admission medications   Medication Sig Start Date End Date Taking? Authorizing Provider  aspirin 81 MG tablet Take 81 mg by mouth daily.      [provider]  atorvastatin (LIPITOR) 20 MG tablet Take 1 tablet (20 mg total) daily by mouth. Needs to establish with new PCP for more refills. 04/23/17   Lance Sell, NP  Carbidopa-Levodopa ER (SINEMET CR) 25-100 MG tablet controlled release 3 (three) times daily.  03/09/14   [provider]  citalopram (CELEXA) 20 MG tablet Take 30 mg by mouth daily.     [provider]  dutasteride (AVODART) 0.5 MG capsule Take 0.5 mg by mouth daily.    [provider]  ibuprofen (ADVIL,MOTRIN) 800 MG tablet Take 1 tablet (800 mg total) by mouth every 8 (eight) hours as needed. 10/09/17   [name], MD  predniSONE (DELTASONE) 5 MG tablet Take by mouth. 10/03/16   [provider]  Tamsulosin HCl (FLOMAX) 0.4 MG CAPS Take 0.4 mg by mouth daily.  [provider]    Family History Family History  Problem Relation Age of Onset  . Uterine cancer Mother        metastatic to liver  . Hypertension Father   . Diabetes Father   . Stroke Father 75  . Heart attack Father 68  . Testicular cancer Brother 6  . Fibromyalgia Sister   . Mental illness Sister        chronic depression  . Colon cancer Neg Hx   . Colon polyps Neg Hx   . Rectal cancer Neg Hx   . Stomach cancer Neg Hx     Social History Social History   Tobacco Use  . Smoking status: Never Smoker  . Smokeless tobacco: Never Used  Substance Use Topics  .  Alcohol use: Yes    Alcohol/week: 8.4 oz    Types: 14 Glasses of wine per week  . Drug use: No     Allergies   Penicillins   Review of Systems Review of Systems  Constitutional: Positive for fatigue. Negative for chills and fever.  HENT: Negative for sore throat and trouble swallowing.   Eyes: Negative for visual disturbance.  Respiratory: Negative for cough and shortness of breath.   Cardiovascular: Negative for chest pain, palpitations and leg swelling.  Gastrointestinal: Positive for abdominal distention, abdominal pain and nausea. Negative for diarrhea and vomiting.  Genitourinary: Positive for difficulty urinating and flank pain.  Musculoskeletal: Positive for back pain and myalgias. Negative for neck pain and neck stiffness.  Skin: Negative for rash and wound.  Neurological: Positive for tremors and weakness. Negative for dizziness, speech difficulty, light-headedness and numbness.  All other systems reviewed and are negative.    Physical Exam Updated Vital Signs BP (!) 149/105 (BP Location: Left Arm)   Pulse 62   Temp 97.8 F (36.6 C) (Oral)   Resp 16   SpO2 98%   Physical Exam  Constitutional: He is oriented to person, place, and time. He appears well-developed and well-nourished. No distress.  HENT:  Head: Normocephalic and atraumatic.  Mouth/Throat: Oropharynx is clear and moist.  No evidence of any head trauma.  Midface is stable.  No intraoral trauma.  Eyes: Pupils are equal, round, and reactive to light. EOM are normal.  Neck: Normal range of motion. Neck supple.  No posterior midline cervical tenderness to palpation.  Cardiovascular: Normal rate and regular rhythm. Exam reveals no gallop and no friction rub.  No murmur heard. Pulmonary/Chest: Effort normal and breath sounds normal. No stridor. No respiratory distress. He has no wheezes. He has no rales. He exhibits no tenderness.  Abdominal: Soft. Bowel sounds are normal. He exhibits distension. There is  no tenderness. There is no rebound and no guarding.  Musculoskeletal: Normal range of motion. He exhibits no edema or tenderness.  Left CVA tenderness to percussion.  No definite midline thoracic or lumbar tenderness.  1+ bilateral lower extremity edema.  No asymmetry.  Distal pulses intact.  Neurological: He is alert and oriented to person, place, and time.  5/5 motor in all extremities.  Sensation to light touch intact.  Fine tremor noted especially of the right upper extremity.  Skin: Skin is warm and dry. No rash noted. He is not diaphoretic. No erythema.  Psychiatric: He has a normal mood and affect. His behavior is normal.  Nursing note and vitals reviewed.    ED Treatments / Results  Labs (all labs ordered are listed, but only abnormal results are displayed) Labs Reviewed  COMPREHENSIVE METABOLIC PANEL - Abnormal; Notable for the following components:      Result Value   Potassium 3.4 (*)    AST 12 (*)    ALT 8 (*)    All other components within normal limits  URINALYSIS, ROUTINE W REFLEX MICROSCOPIC - Abnormal; Notable for the following components:   APPearance HAZY (*)    Hgb urine dipstick LARGE (*)    Ketones, ur 5 (*)    Protein, ur 30 (*)    Leukocytes, UA SMALL (*)    RBC / HPF >50 (*)    Bacteria, UA RARE (*)    All other components within normal limits  URINE CULTURE  CBC WITH DIFFERENTIAL/PLATELET  LIPASE, BLOOD    EKG EKG Interpretation  Date/Time:  Saturday Oct 27 2017 12:27:53 EDT Ventricular Rate:  62 PR Interval:    QRS Duration: 164 QT Interval:  397 QTC Calculation: 404 R Axis:   -39 Text Interpretation:  Normal sinus rhythm Ventricular premature complex Right bundle branch block Confirmed by Julianne Rice [id number]) on 10/27/2017 12:31:26 PM   Radiology Ct Abdomen Pelvis W Contrast  Result Date: 10/27/2017 CLINICAL DATA:  72 year old male with acute abdominal pain and distension today. History of urinary retention and Foley catheter. EXAM: CT  ABDOMEN AND PELVIS WITH CONTRAST TECHNIQUE: Multidetector CT imaging of the abdomen and pelvis was performed using the standard protocol following bolus administration of intravenous contrast. CONTRAST:  132mL ISOVUE-300 IOPAMIDOL (ISOVUE-300) INJECTION 61% COMPARISON:  04/05/2011 and 06/24/2004 CTs FINDINGS: Lower chest: No acute abnormality. Minimal bibasilar atelectasis/scarring noted. Hepatobiliary: The liver and gallbladder are unremarkable. No biliary dilatation. Pancreas: A 1.5 x 2 cm cystic lesion within the pancreatic body is noted. There is no evidence of distal pancreatic ductal dilatation or adjacent inflammation. The remainder of the pancreas is unremarkable. Spleen: Unremarkable Adrenals/Urinary Tract: The kidneys and adrenal glands are unremarkable. Circumferential bladder wall thickening is again noted. A Foley catheter within the bladder is present. Stomach/Bowel: Stomach is within normal limits. No evidence of bowel wall thickening, distention, or inflammatory changes. Vascular/Lymphatic: Aortic atherosclerosis. No enlarged abdominal or pelvic lymph nodes. Reproductive: Marked prostate enlargement noted. Other: There is mild stranding/inflammation in the low anterior RIGHT pelvic fat without adjacent bowel abnormality. This is of uncertain chronicity and significance. No abscess or pneumoperitoneum. A small to moderate supraumbilical midline ventral hernia containing fat and a small to moderate LEFT inguinal hernia containing fat again noted. Musculoskeletal: No acute or suspicious bony abnormalities. Degenerative changes within the spine again identified. IMPRESSION: 1. Mild stranding in the low anterior LEFT pelvic fat of uncertain chronicity but new since 2012. No abscess, pneumoperitoneum or free fluid. 2. Circumferential bladder wall thickening with Foley catheter within the bladder. This wall thickening may be secondary to marked prostate enlargement or may represent cystitis. Correlate  clinically 3. 1.5 x 2 cm cystic lesion within the pancreatic body. Recommend follow up pre and post contrast MRI/MRCP or pancreatic protocol CT in 6 months. This recommendation follows ACR consensus guidelines: Management of Incidental Pancreatic Cysts: A White Paper of the ACR Incidental Findings Committee. J Am Coll Radiol 6010;93:235-573. 4. Unchanged supraumbilical midline ventral hernia containing fat and small to moderate LEFT inguinal hernia containing fat. 5.  Aortic Atherosclerosis (ICD10-I70.0). Electronically Signed   By: Margarette Canada M.D.   On: 10/27/2017 15:59    Procedures Procedures (including critical care time)  Medications Ordered in ED Medications  iopamidol (ISOVUE-300) 61 % injection (has no administration in time range)  cefTRIAXone (  ROCEPHIN) 1 g in sodium chloride 0.9 % 100 mL IVPB (has no administration in time range)  sodium chloride 0.9 % bolus 500 mL (has no administration in time range)  sodium chloride 0.9 % bolus 500 mL (0 mLs Intravenous Stopped 10/27/17 1451)  iopamidol (ISOVUE-300) 61 % injection 100 mL (100 mLs Intravenous Contrast Given 10/27/17 1514)     Initial Impression / Assessment and Plan / ED Course  I have reviewed the triage vital signs and the nursing notes.  Pertinent labs & imaging results that were available during my care of the patient were reviewed by me and considered in my medical decision making (see chart for details).     Likely UTI.  Bladder wall thickening.  Urine culture sent.  Will start on IV antibiotics.  Discussed with hospitalist who will admit.  Final Clinical Impressions(s) / ED Diagnoses   Final diagnoses:  Generalized weakness  Urinary tract infection with hematuria, site unspecified    ED Discharge Orders    None       Julianne Rice, MD 10/27/17 1615

## 2017-10-27 NOTE — ED Notes (Signed)
Bed: WA08 Expected date:  Expected time:  Means of arrival:  Comments: 72 yo weakness, Hx of Parkinson's

## 2017-10-27 NOTE — ED Triage Notes (Signed)
Patient from home, reports as he was attempting to empty foley bag in bathroom this morning he lost his balance and slid down the wall. Hx Parkinson's. Also c/o generalized weakness x3 days. One episode of bowel incontinence PTA. Denies N/V. A&Ox4. Denies head injury and LOC. Reports foley placed on Thursday for urinary retention.

## 2017-10-27 NOTE — ED Notes (Signed)
Attempted to call report. RN doing dressing change. RN to call back in 5 mins

## 2017-10-27 NOTE — ED Notes (Signed)
Pt had soiled depends from BM. Pt cleaned, peri care performed, barrier cream applied.

## 2017-10-27 NOTE — H&P (Addendum)
History and Physical    Kyle Ball OZH:[id number] DOB: 03-Jan-1946 DOA: 10/27/2017  PCP: Golden Circle, FNP   Patient coming from: home   Chief Complaint: weakness, falls   HPI: Kyle Ball is a 72 y.o. male with medical history significant of Parkinson's disease, BPH, hyperlipidemia, depression, Crohn's disease status post partial small bowel resection 20 years ago located by stenosis at the anastomosis site, abnormal EKG who was brought in by his wife for weakness and falls.  Patient has had approximately 6 months of increasing weakness.  He is noted to be significantly less functional at home and requiring more assistance with his IADLs and ADLs.  He was recently seen by his Parkinson's doctor approximately 1 month ago where the dose of his Parkinson's medication was increased however he has not seen much improvement in his symptoms.  Approximately 1 week ago he began to develop dull suprapubic abdominal pain without any associated nausea or vomiting. Pt was seen by urology who placed a Foley with removal of 1.5 L of urine.  His abdominal pain resolved and he was pending outpatient follow-up to do a trial of voiding.  Approximately 2 days ago he began to have worsening weakness and return of the dull suprapubic pain.  His wife reported that twice now she has had to call EMS to the house because he while on the bathroom or while walking with fall and she was unable to get him up.  This acute on chronic weakness is fairly dramatic as she is unable to get him out of bed and she not have significant assistance at her home.  He denies any nausea, vomiting, abdominal pain, cough, congestion, rhinorrhea, chest pain, syncope/presyncope.  He has chronic lower extremity edema that is unchanged.  He has 2 episodes of explosive diarrhea a week due to his underlying Crohn's disease and the known stenosis at the anastomotic site.  ED Course: In the ED patient's vitals were unremarkable.  His labs  were fairly reassuring.  His UA was concerning for infection.  CT abdomen pelvis showed low anterior left pelvic fat stranding of unclear etiology as well as circumferential bladder wall thickening.  Of note patient was noted to have a 1.5 x 2 cystic lesion in the pancreatic body with recommendation for MRI/MRCP or pancreatic protocol CT evaluation.  Patient was admitted with UTI and concern for weakness.  Review of Systems: As per HPI otherwise 10 point review of systems negative.    Past Medical History:  Diagnosis Date  . Anxiety and depression   . BPH (benign prostatic hypertrophy)   . Crohn's disease ([place]) 1979   After resection, 1 flare in 2012.  [facility]land Kitchen Dysmetabolic syndrome X   . ED (erectile dysfunction)   . Hyperlipidemia   . Internal hemorrhoids   . Parkinson's disease ([place])    [facility]    Past Surgical History:  Procedure Laterality Date  . COLONOSCOPY W/ BIOPSIES   06/2007, 2015   Crohn's, internal hemorrhoids, Dr Carlean Purl  . KNEE ARTHROSCOPY Right 06/2013  . PROSTATE BIOPSY     X 2, Dr Risa Grill  . PTNS Treatment     urinary incontinence  . QUADRICEPS REPAIR Right 1966    knee  . REFRACTIVE SURGERY  2015   Dr Bing Plume  . Terminal Ileum & Appendix Resected  1995   Crohn's     reports that he has never smoked. He has never used smokeless tobacco. He reports that he drinks about 8.4 oz of  alcohol per week. He reports that he does not use drugs.  Allergies  Allergen Reactions  . Penicillins     Rash age 42 with PCN injection. Because of a history of documented adverse serious drug reaction;Medi Alert bracelet  is recommended    Family History  Problem Relation Age of Onset  . Uterine cancer Mother        metastatic to liver  . Hypertension Father   . Diabetes Father   . Stroke Father 19  . Heart attack Father 90  . Testicular cancer Brother 42  . Fibromyalgia Sister   . Mental illness Sister        chronic depression  . Colon cancer Neg Hx   . Colon polyps Neg Hx     . Rectal cancer Neg Hx   . Stomach cancer Neg Hx      Prior to Admission medications   Medication Sig Start Date End Date Taking? Authorizing Provider  aspirin 81 MG tablet Take 81 mg by mouth daily.      [provider]  atorvastatin (LIPITOR) 20 MG tablet Take 1 tablet (20 mg total) daily by mouth. Needs to establish with new PCP for more refills. 04/23/17   Lance Sell, NP  Carbidopa-Levodopa ER (SINEMET CR) 25-100 MG tablet controlled release 3 (three) times daily.  03/09/14   [provider]  citalopram (CELEXA) 20 MG tablet Take 30 mg by mouth daily.     [provider]  dutasteride (AVODART) 0.5 MG capsule Take 0.5 mg by mouth daily.    [provider]  ibuprofen (ADVIL,MOTRIN) 800 MG tablet Take 1 tablet (800 mg total) by mouth every 8 (eight) hours as needed. 10/09/17   [name], MD  predniSONE (DELTASONE) 5 MG tablet Take by mouth. 10/03/16   [provider]  Tamsulosin HCl (FLOMAX) 0.4 MG CAPS Take 0.4 mg by mouth daily.      [provider]    Physical Exam: Vitals:   10/27/17 1230 10/27/17 1300 10/27/17 1536 10/27/17 1600  BP: (!) 145/65 (!) 156/77 (!) 149/105 139/89  Pulse:  (!) 59 62   Resp: 18  16 14   Temp:   97.8 F (36.6 C)   TempSrc:   Oral   SpO2:  96% 98%     Constitutional: NAD, calm, comfortable Vitals:   10/27/17 1230 10/27/17 1300 10/27/17 1536 10/27/17 1600  BP: (!) 145/65 (!) 156/77 (!) 149/105 139/89  Pulse:  (!) 59 62   Resp: 18  16 14   Temp:   97.8 F (36.6 C)   TempSrc:   Oral   SpO2:  96% 98%    Eyes: Anicteric sclera ENMT: Dry mucous membranes, good dentition Neck: normal, supple Respiratory: clear to auscultation bilaterally, no wheezing, no crackles. Normal respiratory effort. No accessory muscle use.  Cardiovascular: Irregularly regular rhythm, 2 out of 6 systolic murmur heard across precordium Abdomen: no tenderness, no masses palpated. No hepatosplenomegaly. Bowel  sounds positive.  Musculoskeletal: 1+ lower extremity edema Skin: no rashes on visible skin Neurologic: Cogwheel rigidity noted, masked facies.  Psychiatric: Normal judgment and insight. Alert and oriented x 3. Normal mood.   Labs on Admission: I have personally reviewed following labs and imaging studies  CBC: Recent Labs  Lab 10/27/17 1249  WBC 6.8  NEUTROABS 4.8  HGB 14.5  HCT 43.8  MCV 84.7  PLT 132   Basic Metabolic Panel: Recent Labs  Lab 10/27/17 1249  NA 139  K 3.4*  CL 103  CO2 25  GLUCOSE 95  BUN 14  CREATININE 0.97  CALCIUM 9.1   GFR: CrCl cannot be calculated (Unknown ideal weight.). Liver Function Tests: Recent Labs  Lab 10/27/17 1249  AST 12*  ALT 8*  ALKPHOS 64  BILITOT 0.9  PROT 7.0  ALBUMIN 3.7   Recent Labs  Lab 10/27/17 1249  LIPASE 25   No results for input(s): AMMONIA in the last 168 hours. Coagulation Profile: No results for input(s): INR, PROTIME in the last 168 hours. Cardiac Enzymes: No results for input(s): CKTOTAL, CKMB, CKMBINDEX, TROPONINI in the last 168 hours. BNP (last 3 results) No results for input(s): PROBNP in the last 8760 hours. HbA1C: No results for input(s): HGBA1C in the last 72 hours. CBG: No results for input(s): GLUCAP in the last 168 hours. Lipid Profile: No results for input(s): CHOL, HDL, LDLCALC, TRIG, CHOLHDL, LDLDIRECT in the last 72 hours. Thyroid Function Tests: No results for input(s): TSH, T4TOTAL, FREET4, T3FREE, THYROIDAB in the last 72 hours. Anemia Panel: No results for input(s): VITAMINB12, FOLATE, FERRITIN, TIBC, IRON, RETICCTPCT in the last 72 hours. Urine analysis:    Component Value Date/Time   COLORURINE YELLOW 10/27/2017 1249   APPEARANCEUR HAZY (A) 10/27/2017 1249   LABSPEC 1.016 10/27/2017 1249   PHURINE 5.0 10/27/2017 1249   GLUCOSEU NEGATIVE 10/27/2017 1249   HGBUR LARGE (A) 10/27/2017 1249   BILIRUBINUR NEGATIVE 10/27/2017 1249   KETONESUR 5 (A) 10/27/2017 1249    PROTEINUR 30 (A) 10/27/2017 1249   NITRITE NEGATIVE 10/27/2017 1249   LEUKOCYTESUR SMALL (A) 10/27/2017 1249    Radiological Exams on Admission: Ct Abdomen Pelvis W Contrast  Result Date: 10/27/2017 CLINICAL DATA:  72 year old male with acute abdominal pain and distension today. History of urinary retention and Foley catheter. EXAM: CT ABDOMEN AND PELVIS WITH CONTRAST TECHNIQUE: Multidetector CT imaging of the abdomen and pelvis was performed using the standard protocol following bolus administration of intravenous contrast. CONTRAST:  165mL ISOVUE-300 IOPAMIDOL (ISOVUE-300) INJECTION 61% COMPARISON:  04/05/2011 and 06/24/2004 CTs FINDINGS: Lower chest: No acute abnormality. Minimal bibasilar atelectasis/scarring noted. Hepatobiliary: The liver and gallbladder are unremarkable. No biliary dilatation. Pancreas: A 1.5 x 2 cm cystic lesion within the pancreatic body is noted. There is no evidence of distal pancreatic ductal dilatation or adjacent inflammation. The remainder of the pancreas is unremarkable. Spleen: Unremarkable Adrenals/Urinary Tract: The kidneys and adrenal glands are unremarkable. Circumferential bladder wall thickening is again noted. A Foley catheter within the bladder is present. Stomach/Bowel: Stomach is within normal limits. No evidence of bowel wall thickening, distention, or inflammatory changes. Vascular/Lymphatic: Aortic atherosclerosis. No enlarged abdominal or pelvic lymph nodes. Reproductive: Marked prostate enlargement noted. Other: There is mild stranding/inflammation in the low anterior RIGHT pelvic fat without adjacent bowel abnormality. This is of uncertain chronicity and significance. No abscess or pneumoperitoneum. A small to moderate supraumbilical midline ventral hernia containing fat and a small to moderate LEFT inguinal hernia containing fat again noted. Musculoskeletal: No acute or suspicious bony abnormalities. Degenerative changes within the spine again identified.  IMPRESSION: 1. Mild stranding in the low anterior LEFT pelvic fat of uncertain chronicity but new since 2012. No abscess, pneumoperitoneum or free fluid. 2. Circumferential bladder wall thickening with Foley catheter within the bladder. This wall thickening may be secondary to marked prostate enlargement or may represent cystitis. Correlate clinically 3. 1.5 x 2 cm cystic lesion within the pancreatic body. Recommend follow up pre and post contrast MRI/MRCP or pancreatic protocol CT in 6 months.  This recommendation follows ACR consensus guidelines: Management of Incidental Pancreatic Cysts: A White Paper of the ACR Incidental Findings Committee. J Am Coll Radiol 9169;[id number]. 4. Unchanged supraumbilical midline ventral hernia containing fat and small to moderate LEFT inguinal hernia containing fat. 5.  Aortic Atherosclerosis (ICD10-I70.0). Electronically Signed   By: Margarette Canada M.D.   On: 10/27/2017 15:59    EKG: Independently reviewed.  Sinus rhythm, irregular, bifascicular block or possibly right bundle branch block, unchanged from prior  Assessment/Plan Active Problems:   HYPERLIPIDEMIA   Crohn's disease of small intestine with intestinal obstruction (HCC)   BPH (benign prostatic hyperplasia)   Parkinson's disease (HCC)   Nonspecific abnormal electrocardiogram (ECG) (EKG)   Acute lower UTI   #) Weakness/possible UTI: It is unclear what is causing this acute on chronic weakness for the patient.  Certainly a UTI could be the most likely culprit however he has had significant amounts of weakness for the past several months.  It is fairly clear that he is unsafe to be at home. -IV ceftriaxone 1 g daily -PT consult -Social work consult for placement -Gentle IV hydration  #) Crohn's disease status post small bowel resection complicated by stenosis: Stable at this time.  Patient is receiving being outpatient dilations by GI doctor.  #) Benign prostatic hypertrophy: -Continue Foley - Continue  dutasteride 0.5 mg daily - Continue tamsulosin 0.4 mg daily  #) Parkinson's disease: -Continue carbidopa-levodopa 25-2 50 3 times daily  #) Hyperlipidemia: -Continue aspirin 81 mg  #) Depression: -Continue citalopram 30 mg daily  Fluids: Gentle IV fluids Elect lites: Monitor and supplement Nutrition: Regular diet  Prophylaxis: Enoxaparin  Disposition: Pending PT recommendations and placement  DNR    Cristy Folks MD [facility]   If 7PM-7AM, please contact night-coverage [email or web address] Password [facility]  10/27/2017, 4:35 PM

## 2017-10-27 NOTE — ED Notes (Signed)
Patient transported to CT 

## 2017-10-28 DIAGNOSIS — E782 Mixed hyperlipidemia: Secondary | ICD-10-CM

## 2017-10-28 DIAGNOSIS — R350 Frequency of micturition: Secondary | ICD-10-CM

## 2017-10-28 DIAGNOSIS — R531 Weakness: Secondary | ICD-10-CM

## 2017-10-28 DIAGNOSIS — R338 Other retention of urine: Secondary | ICD-10-CM | POA: Diagnosis not present

## 2017-10-28 DIAGNOSIS — R9431 Abnormal electrocardiogram [ECG] [EKG]: Secondary | ICD-10-CM

## 2017-10-28 DIAGNOSIS — K50012 Crohn's disease of small intestine with intestinal obstruction: Secondary | ICD-10-CM

## 2017-10-28 DIAGNOSIS — R310 Gross hematuria: Secondary | ICD-10-CM | POA: Diagnosis not present

## 2017-10-28 DIAGNOSIS — N390 Urinary tract infection, site not specified: Secondary | ICD-10-CM | POA: Diagnosis not present

## 2017-10-28 DIAGNOSIS — N401 Enlarged prostate with lower urinary tract symptoms: Secondary | ICD-10-CM | POA: Diagnosis not present

## 2017-10-28 LAB — BASIC METABOLIC PANEL WITH GFR
Anion gap: 10 (ref 5–15)
BUN: 12 mg/dL (ref 6–20)
Calcium: 8.7 mg/dL — ABNORMAL LOW (ref 8.9–10.3)
Chloride: 105 mmol/L (ref 101–111)
GFR calc non Af Amer: >60 mL/min (ref >60)
Glucose, Bld: 92 mg/dL (ref 65–99)
Potassium: 3.1 mmol/L — ABNORMAL LOW (ref 3.5–5.1)

## 2017-10-28 LAB — CBC
HCT: 41 % (ref 39.0–52.0)
Hemoglobin: 13.2 g/dL (ref 13.0–17.0)
MCH: 27.4 pg (ref 26.0–34.0)
MCHC: 32.2 g/dL (ref 30.0–36.0)
MCV: 85.2 fL (ref 78.0–100.0)
Platelets: 173 10*3/uL (ref 150–400)
RBC: 4.81 MIL/uL (ref 4.22–5.81)
RDW: 13.6 % (ref 11.5–15.5)
WBC: 5.9 10*3/uL (ref 4.0–10.5)

## 2017-10-28 LAB — BASIC METABOLIC PANEL
CO2: 26 mmol/L (ref 22–32)
Creatinine, Ser: 0.89 mg/dL (ref 0.61–1.24)
GFR calc Af Amer: >60 mL/min (ref >60)
Sodium: 141 mmol/L (ref 135–145)

## 2017-10-28 LAB — URINE CULTURE: CULTURE: NO GROWTH

## 2017-10-28 MED ORDER — POTASSIUM CHLORIDE CRYS ER 20 MEQ PO TBCR
40.0000 meq | EXTENDED_RELEASE_TABLET | Freq: Two times a day (BID) | ORAL | Status: AC
  Administered 2017-10-28 (×2): 40 meq via ORAL
  Filled 2017-10-28 (×2): qty 2

## 2017-10-28 MED ORDER — NAPHAZOLINE-PHENIRAMINE 0.025-0.3 % OP SOLN
1.0000 [drp] | Freq: Four times a day (QID) | OPHTHALMIC | Status: DC | PRN
  Administered 2017-10-28 (×3): 1 [drp] via OPHTHALMIC
  Filled 2017-10-28: qty 15

## 2017-10-28 MED ORDER — MAGNESIUM SULFATE 2 GM/50ML IV SOLN
2.0000 g | Freq: Once | INTRAVENOUS | Status: AC
  Administered 2017-10-28: 2 g via INTRAVENOUS
  Filled 2017-10-28: qty 50

## 2017-10-28 NOTE — Clinical Social Work Note (Signed)
Clinical Social Work Assessment  Patient Details  Name: Kyle Ball MRN: [id number] Date of Birth: [birth year]  Date of referral:  10/28/17               Reason for consult:  Facility Placement                Permission sought to share information with:  Family Supports Permission granted to share information::  Yes, Verbal Permission Granted  Name::     wife Tye [place] [phone]  Agency::     Relationship::     Contact Information:     Housing/Transportation Living arrangements for the past 2 months:  Single Family Home Source of Information:  Patient Patient Interpreter Needed:  None Criminal Activity/Legal Involvement Pertinent to Current Situation/Hospitalization:  No - Comment as needed Significant Relationships:  Spouse, Community Support Lives with:  Spouse Do you feel safe going back to the place where you live?  Yes Need for family participation in patient care:  Yes (Comment)(states he is discussing plans with wife)  Care giving concerns:  Pt admitted from home where he resides with his wife. At baseline reports he has issues with ambulating due to Parkinson's but up until a few weeks ago was ambulating "slowly but okay by himself." Started having more frequent falls "my legs would just give out." Reports his [facility]ist at [facility] recommended outpatient rehab program and he "has almost completed it, has 2 more sessions. They were recommending maybe a cane or 3 in 1 walker at home when I finish."  Admitted under observation currently for treatment of UTI   Social Worker assessment / plan:  CSW consulted to assist with disposition. Met with pt at bedside- alert/oriented, engaged.  Gathered description of care needs above. Pt reports he and wife are open to options at DC for him to continue working on safety and mobility at home. He states he "is hoping he will be able to go straight back home, but if that is more than my wife can handle, I'm open to looking into rehab." Pt  reports he has been doing OP rehab but unfamiliar with SNF. CSW and he discussed placement process if needed and that Medicare may or may not cover depending on course of hospital stay and his ambulation needs. Pt understanding and states he will want to discuss more once evaluated and closer to DC.  Plan: TBD- PT eval pending, will be helpful in determining level of care needs. Pt hoping to return directly home but open to discussing SNF placement if needed.   Employment status:  Retired Forensic scientist:  Medicare PT Recommendations:  (pending eval) Information / Referral to community resources:     Patient/Family's Response to care:  Pt appreciative  Patient/Family's Understanding of and Emotional Response to Diagnosis, Current Treatment, and Prognosis:  Pt shows adequate understanding of his treatment, describes being treated for infection. Is goal oriented and focused on preparing for DC, asking length of stay expected for this treatment and on factors which determine DC recommendations. States, "I am feeling hopeful because I already feel stronger. Last night I walked to the bathroom by myself and that was an improvement."  Emotional Assessment Appearance:  Appears stated age Attitude/Demeanor/Rapport:  Engaged Affect (typically observed):  Accepting, Adaptable Orientation:  Oriented to Self, Oriented to Place, Oriented to  Time, Oriented to Situation Alcohol / Substance use:  Not Applicable Psych involvement (Current and /or in the community):  No (Comment)  Discharge Needs  Concerns to  be addressed:  Decision making concerns, Discharge Planning Concerns Readmission within the last 30 days:  No Current discharge risk:  (assessing) Barriers to Discharge:  Continued Medical Work up   [employer], LCSW 10/28/2017, 10:39 AM  [phone] weekend coverage [phone]

## 2017-10-28 NOTE — Consult Note (Signed)
Urology Consult Note   Requesting Attending Physician:  Kayleen Memos, DO Service Providing Consult: Urology  Consulting Attending: Louis Meckel   Reason for Consult:  Hematuria  HPI: Kyle Ball is seen in consultation for reasons noted above at the request of Kayleen Memos, DO for evaluation of hematurai.  This is a 72 y.o. male with hx of parkinson's and recent urinary retention requiring foley catheterization.   Has a hx of parkinson's associated voiding dysfunction and has tried anticholinergics. At last visit, 5/17, has suprapubic pain and found to have elevate residual. Foley placed for 1.7 L.    Yesterday presented to [facility] ED with weakness and 2 ground falls. Afebrile with normal vital signs.  WBC - 6.8 Cr - 0.97 U/A - small LE, negative nitrites, rare bacteria, 11-20 WBC ( in setting of foley catheter)  CT showed circumferential bladder wall thickening as well as a pancreatic cystic leasion  He was admitted for presumed UTI. Today had a bout of hematuria. Urology was consulted. Nursing flushed the catheter soon after and the urine cleared. On assesment by urology the urine in the tubing was clear and yellow.   Catheter flushed easily and seated well. Patient things his catheter may have been tugged earlier today.   Urine culture no growth  Past Medical History: Past Medical History:  Diagnosis Date  . Anxiety and depression   . BPH (benign prostatic hypertrophy)   . Crohn's disease ([place]) 1979   After resection, 1 flare in 2012.  Marland Kitchen Dysmetabolic syndrome X   . ED (erectile dysfunction)   . Hyperlipidemia   . Internal hemorrhoids   . Parkinson's disease ([place])    [facility]    Past Surgical History:  Past Surgical History:  Procedure Laterality Date  . COLONOSCOPY W/ BIOPSIES   06/2007, 2015   Crohn's, internal hemorrhoids, Dr Carlean Purl  . KNEE ARTHROSCOPY Right 06/2013  . PROSTATE BIOPSY     X 2, Dr Risa Grill  . PTNS Treatment     urinary incontinence  . QUADRICEPS  REPAIR Right 1966    knee  . REFRACTIVE SURGERY  2015   Dr Bing Plume  . Terminal Ileum & Appendix Resected  1995   Crohn's    Medication: Current Facility-Administered Medications  Medication Dose Route Frequency Provider Last Rate Last Dose  . 0.9 %  sodium chloride infusion   Intravenous Continuous Irene Pap N, DO 100 mL/hr at 10/28/17 0410    . acetaminophen (TYLENOL) tablet 650 mg  650 mg Oral Q6H PRN Purohit, Shrey C, MD       Or  . acetaminophen (TYLENOL) suppository 650 mg  650 mg Rectal Q6H PRN Purohit, Shrey C, MD      . aspirin chewable tablet 81 mg  81 mg Oral Daily Purohit, Shrey C, MD   81 mg at 10/28/17 1004  . carbidopa-levodopa (SINEMET IR) 25-250 MG per tablet immediate release 1 tablet  1 tablet Oral TID Purohit, Konrad Dolores, MD   1 tablet at 10/28/17 1003  . cefTRIAXone (ROCEPHIN) 2 g in sodium chloride 0.9 % 100 mL IVPB  2 g Intravenous Q24H Purohit, Shrey C, MD 200 mL/hr at 10/28/17 1003 2 g at 10/28/17 1003  . citalopram (CELEXA) tablet 30 mg  30 mg Oral Daily Purohit, Shrey C, MD   30 mg at 10/27/17 1947  . dutasteride (AVODART) capsule 0.5 mg  0.5 mg Oral Daily Purohit, Shrey C, MD   0.5 mg at 10/28/17 1005  . enoxaparin (LOVENOX)  injection 40 mg  40 mg Subcutaneous Q24H Purohit, Shrey C, MD   40 mg at 10/27/17 2229  . naphazoline-pheniramine (NAPHCON-A) 0.025-0.3 % ophthalmic solution 1 drop  1 drop Both Eyes QID PRN Irene Pap N, DO   1 drop at 10/28/17 1003  . ondansetron (ZOFRAN) tablet 4 mg  4 mg Oral Q6H PRN Purohit, Shrey C, MD       Or  . ondansetron (ZOFRAN) injection 4 mg  4 mg Intravenous Q6H PRN Purohit, Shrey C, MD      . polyethylene glycol (MIRALAX / GLYCOLAX) packet 17 g  17 g Oral Daily PRN Purohit, Shrey C, MD      . potassium chloride SA (K-DUR,KLOR-CON) CR tablet 40 mEq  40 mEq Oral BID Irene Pap N, DO   40 mEq at 10/28/17 1004  . tamsulosin (FLOMAX) capsule 0.4 mg  0.4 mg Oral Daily Purohit, Shrey C, MD   0.4 mg at 10/28/17 1004  . traMADol  (ULTRAM) tablet 50 mg  50 mg Oral Q6H PRN Purohit, Konrad Dolores, MD        Allergies: Allergies  Allergen Reactions  . Penicillins     CHILDHOOD ALLERGY Has patient had a PCN reaction causing immediate rash, facial/tongue/throat swelling, SOB or lightheadedness with hypotension: Yes Has patient had a PCN reaction causing severe rash involving mucus membranes or skin necrosis: No Has patient had a PCN reaction that required hospitalization: No Has patient had a PCN reaction occurring within the last 10 years:No If all of the above answers are "NO", then may proceed with Cephalosporin use.     Social History: Social History   Tobacco Use  . Smoking status: Never Smoker  . Smokeless tobacco: Never Used  Substance Use Topics  . Alcohol use: Yes    Alcohol/week: 8.4 oz    Types: 14 Glasses of wine per week  . Drug use: No    Family History Family History  Problem Relation Age of Onset  . Uterine cancer Mother        metastatic to liver  . Hypertension Father   . Diabetes Father   . Stroke Father 43  . Heart attack Father 100  . Testicular cancer Brother 64  . Fibromyalgia Sister   . Mental illness Sister        chronic depression  . Colon cancer Neg Hx   . Colon polyps Neg Hx   . Rectal cancer Neg Hx   . Stomach cancer Neg Hx     Review of Systems 10 systems were reviewed and are negative except as noted specifically in the HPI.  Objective   Vital signs in last 24 hours: BP (!) 144/62 (BP Location: Right Arm)   Pulse 64   Temp 98 F (36.7 C) (Oral)   Resp 20   Ht 6\' 2"  (1.88 m)   Wt 104 kg (229 lb 3.2 oz)   SpO2 96%   BMI 29.43 kg/m   Physical Exam General: NAD, A&O, resting, appropriate HEENT: [place]/AT, EOMI, MMM Pulmonary: Normal work of breathing Cardiovascular: HDS, adequate peripheral perfusion Abdomen: Soft, NTTP, nondistended, . GU: foley in place draining clear urine Extremities: slow coordinated movements  Neuro: masked facies amongst other  parkinsonian sequela  Most Recent Labs: Lab Results  Component Value Date   WBC 5.9 10/28/2017   HGB 13.2 10/28/2017   HCT 41.0 10/28/2017   PLT 173 10/28/2017    Lab Results  Component Value Date   NA 141 10/28/2017  K 3.1 (L) 10/28/2017   CL 105 10/28/2017   CO2 26 10/28/2017   BUN 12 10/28/2017   CREATININE 0.89 10/28/2017   CALCIUM 8.7 (L) 10/28/2017    No results found for: INR, APTT   IMAGING: Ct Abdomen Pelvis W Contrast  Result Date: 10/27/2017 CLINICAL DATA:  72 year old male with acute abdominal pain and distension today. History of urinary retention and Foley catheter. EXAM: CT ABDOMEN AND PELVIS WITH CONTRAST TECHNIQUE: Multidetector CT imaging of the abdomen and pelvis was performed using the standard protocol following bolus administration of intravenous contrast. CONTRAST:  158mL ISOVUE-300 IOPAMIDOL (ISOVUE-300) INJECTION 61% COMPARISON:  04/05/2011 and 06/24/2004 CTs FINDINGS: Lower chest: No acute abnormality. Minimal bibasilar atelectasis/scarring noted. Hepatobiliary: The liver and gallbladder are unremarkable. No biliary dilatation. Pancreas: A 1.5 x 2 cm cystic lesion within the pancreatic body is noted. There is no evidence of distal pancreatic ductal dilatation or adjacent inflammation. The remainder of the pancreas is unremarkable. Spleen: Unremarkable Adrenals/Urinary Tract: The kidneys and adrenal glands are unremarkable. Circumferential bladder wall thickening is again noted. A Foley catheter within the bladder is present. Stomach/Bowel: Stomach is within normal limits. No evidence of bowel wall thickening, distention, or inflammatory changes. Vascular/Lymphatic: Aortic atherosclerosis. No enlarged abdominal or pelvic lymph nodes. Reproductive: Marked prostate enlargement noted. Other: There is mild stranding/inflammation in the low anterior RIGHT pelvic fat without adjacent bowel abnormality. This is of uncertain chronicity and significance. No abscess or  pneumoperitoneum. A small to moderate supraumbilical midline ventral hernia containing fat and a small to moderate LEFT inguinal hernia containing fat again noted. Musculoskeletal: No acute or suspicious bony abnormalities. Degenerative changes within the spine again identified. IMPRESSION: 1. Mild stranding in the low anterior LEFT pelvic fat of uncertain chronicity but new since 2012. No abscess, pneumoperitoneum or free fluid. 2. Circumferential bladder wall thickening with Foley catheter within the bladder. This wall thickening may be secondary to marked prostate enlargement or may represent cystitis. Correlate clinically 3. 1.5 x 2 cm cystic lesion within the pancreatic body. Recommend follow up pre and post contrast MRI/MRCP or pancreatic protocol CT in 6 months. This recommendation follows ACR consensus guidelines: Management of Incidental Pancreatic Cysts: A White Paper of the ACR Incidental Findings Committee. J Am Coll Radiol 8916;94:503-888. 4. Unchanged supraumbilical midline ventral hernia containing fat and small to moderate LEFT inguinal hernia containing fat. 5.  Aortic Atherosclerosis (ICD10-I70.0). Electronically Signed   By: Margarette Canada M.D.   On: 10/27/2017 15:59    ------  Assessment:  72 y.o. male with hx of parkinsons with recent bout of urinary retention requiring foley catheterization. Admitted yesterday for UTI - urine culture no growth. Today with gross hematuria.   Catheter currently draining clear with no clots. Episode of hematuria likely due to catheter trauma    Recommendations: - Continue foley to drainage - Follow up with urology as scheduled  - OK for nursing to flush foley for lack of drainage, gross hematuria, or concern for clot obstruction  - If foley becomes clogged with clots and flushing is not resulting in clearing of hematuria, please reconsult urology.    Thank you for this consult. Please contact the urology consult pager with any further  questions/concerns.

## 2017-10-28 NOTE — Progress Notes (Signed)
PROGRESS NOTE  Kyle Ball GBT:[id number] DOB: 10-26-1945 DOA: 10/27/2017 PCP: Golden Circle, FNP  HPI/Recap of past 24 hours:  Kyle Ball is a 72 y.o. male with medical history significant of Parkinson's disease, BPH, hyperlipidemia, depression, Crohn's disease status post partial small bowel resection 20 years ago located by stenosis at the anastomosis site, abnormal EKG who was brought in by his wife for weakness and falls.  Patient has had approximately 6 months of increasing weakness.  He is noted to be significantly less functional at home and requiring more assistance with his IADLs and ADLs.  He was recently seen by his Parkinson's doctor approximately 1 month ago where the dose of his Parkinson's medication was increased however he has not seen much improvement in his symptoms.  Approximately 1 week ago he began to develop dull suprapubic abdominal pain without any associated nausea or vomiting. Pt was seen by urology who placed a Foley with removal of 1.5 L of urine.  His abdominal pain resolved and he was pending outpatient follow-up to do a trial of voiding.  Approximately 2 days ago he began to have worsening weakness and return of the dull suprapubic pain.  His wife reported that twice now she has had to call EMS to the house because he while on the bathroom or while walking with fall and she was unable to get him up.  This acute on chronic weakness is fairly dramatic as she is unable to get him out of bed and she not have significant assistance at her home.  He denies any nausea, vomiting, abdominal pain, cough, congestion, rhinorrhea, chest pain, syncope/presyncope.  He has chronic lower extremity edema that is unchanged.  He has 2 episodes of explosive diarrhea a week due to his underlying Crohn's disease and the known stenosis at the anastomotic site.  ED Course: In the ED patient's vitals were unremarkable.  His labs were fairly reassuring.  His UA was concerning for  infection.  CT abdomen pelvis showed low anterior left pelvic fat stranding of unclear etiology as well as circumferential bladder wall thickening.  Of note patient was noted to have a 1.5 x 2 cystic lesion in the pancreatic body with recommendation for MRI/MRCP or pancreatic protocol CT evaluation.  Patient was admitted with UTI and concern for weakness.  10/28/2017: He has no new complaints.  RN reports GROSS hematuria in Foley bag.  Provider contacted urology Dr. Drucie Ip who recommended doing a bladder scan and Foley cath flush.  Urology will see the patient this afternoon.  Assessment/Plan: Active Problems:   HYPERLIPIDEMIA   Crohn's disease of small intestine with intestinal obstruction (HCC)   BPH (benign prostatic hyperplasia)   Parkinson's disease (HCC)   Nonspecific abnormal electrocardiogram (ECG) (EKG)   Acute lower UTI   UTI (urinary tract infection)  Generalized weakness complicated by frequent falls Suspect secondary to UTI versus Parkinson disease Denies hitting his head during these episode Treat UTI PT to evaluate and treat Fall precautions  UTI, POA Pyuria with urine WBC 11-20 On IV ceftriaxone Has indwelling Foley catheter for urine retention due to advanced BPH  Hematuria in the setting of indwelling catheter Urology consulted.  Highly appreciated Urology recommended bladder scan and Foley cath flush Hemoglobin stable at 13 Continue to monitor  Parkinson's disease Continue home medications Fall precautions  Hypokalemia Potassium 3.1 Repleted with KCl p.o. and IV magnesium 2 g once  Hyperlipidemia Continue home medications  BPH Continue tamsulosin Has a indwelling Foley catheter  for urinary retention  History of urinary retention Foley cath indwelling No AKI or CKD  Medically dysfunction with frequent falls Topicals PT to evaluate and treat Social worker consulted for placement    Code Status: DNR  Family Communication: None at  bedside  Disposition Plan: SNF when clinically stable   Consultants:  Urology  Procedures:  None  Antimicrobials:  ceftriaxone  DVT prophylaxis: Subcu Lovenox   Objective: Vitals:   10/27/17 1600 10/27/17 1851 10/27/17 2133 10/28/17 0518  BP: 139/89 (!) 153/96 131/63 136/75  Pulse:  (!) 54 67 (!) 58  Resp: 14  20 20   Temp:  98.3 F (36.8 C) 98 F (36.7 C) 98 F (36.7 C)  TempSrc:  Oral Oral Oral  SpO2:  100% 97% 96%  Weight:   104 kg (229 lb 3.2 oz)   Height:   6' 2"  (1.88 m)     Intake/Output Summary (Last 24 hours) at 10/28/2017 1256 Last data filed at 10/28/2017 1241 Gross per 24 hour  Intake 1398.33 ml  Output 1300 ml  Net 98.33 ml   Filed Weights   10/27/17 2133  Weight: 104 kg (229 lb 3.2 oz)    Exam:  . General: 72 y.o. year-old male well developed well nourished in no acute distress.  Alert and oriented x3.  Mild tremors noted on right upper extremity. . Cardiovascular: Regular rate and rhythm with no rubs or gallops.  No thyromegaly or JVD noted.   Marland Kitchen Respiratory: Clear to auscultation with no wheezes or rales. Good inspiratory effort. . Abdomen: Soft nontender nondistended with normal bowel sounds x4 quadrants. . Musculoskeletal: No lower extremity edema. 2/4 pulses in all 4 extremities. . Skin: No ulcerative lesions noted or rashes.  indwelling Foley cath in place. Marland Kitchen Psychiatry: Mood is appropriate for condition and setting   Data Reviewed: CBC: Recent Labs  Lab 10/27/17 1249 10/28/17 0352  WBC 6.8 5.9  NEUTROABS 4.8  --   HGB 14.5 13.2  HCT 43.8 41.0  MCV 84.7 85.2  PLT 178 270   Basic Metabolic Panel: Recent Labs  Lab 10/27/17 1249 10/28/17 0352  NA 139 141  K 3.4* 3.1*  CL 103 105  CO2 25 26  GLUCOSE 95 92  BUN 14 12  CREATININE 0.97 0.89  CALCIUM 9.1 8.7*   GFR: Estimated Creatinine Clearance: 100.7 mL/min (by C-G formula based on SCr of 0.89 mg/dL). Liver Function Tests: Recent Labs  Lab 10/27/17 1249  AST 12*   ALT 8*  ALKPHOS 64  BILITOT 0.9  PROT 7.0  ALBUMIN 3.7   Recent Labs  Lab 10/27/17 1249  LIPASE 25   No results for input(s): AMMONIA in the last 168 hours. Coagulation Profile: No results for input(s): INR, PROTIME in the last 168 hours. Cardiac Enzymes: No results for input(s): CKTOTAL, CKMB, CKMBINDEX, TROPONINI in the last 168 hours. BNP (last 3 results) No results for input(s): PROBNP in the last 8760 hours. HbA1C: No results for input(s): HGBA1C in the last 72 hours. CBG: No results for input(s): GLUCAP in the last 168 hours. Lipid Profile: No results for input(s): CHOL, HDL, LDLCALC, TRIG, CHOLHDL, LDLDIRECT in the last 72 hours. Thyroid Function Tests: No results for input(s): TSH, T4TOTAL, FREET4, T3FREE, THYROIDAB in the last 72 hours. Anemia Panel: No results for input(s): VITAMINB12, FOLATE, FERRITIN, TIBC, IRON, RETICCTPCT in the last 72 hours. Urine analysis:    Component Value Date/Time   COLORURINE YELLOW 10/27/2017 1249   APPEARANCEUR HAZY (A) 10/27/2017 1249  LABSPEC 1.016 10/27/2017 1249   PHURINE 5.0 10/27/2017 1249   GLUCOSEU NEGATIVE 10/27/2017 1249   HGBUR LARGE (A) 10/27/2017 1249   BILIRUBINUR NEGATIVE 10/27/2017 1249   KETONESUR 5 (A) 10/27/2017 1249   PROTEINUR 30 (A) 10/27/2017 1249   NITRITE NEGATIVE 10/27/2017 1249   LEUKOCYTESUR SMALL (A) 10/27/2017 1249   Sepsis Labs: @LABRCNTIP (procalcitonin:4,lacticidven:4)  )No results found for this or any previous visit (from the past 240 hour(s)).    Studies: Ct Abdomen Pelvis W Contrast  Result Date: 10/27/2017 CLINICAL DATA:  72 year old male with acute abdominal pain and distension today. History of urinary retention and Foley catheter. EXAM: CT ABDOMEN AND PELVIS WITH CONTRAST TECHNIQUE: Multidetector CT imaging of the abdomen and pelvis was performed using the standard protocol following bolus administration of intravenous contrast. CONTRAST:  193m ISOVUE-300 IOPAMIDOL (ISOVUE-300)  INJECTION 61% COMPARISON:  04/05/2011 and 06/24/2004 CTs FINDINGS: Lower chest: No acute abnormality. Minimal bibasilar atelectasis/scarring noted. Hepatobiliary: The liver and gallbladder are unremarkable. No biliary dilatation. Pancreas: A 1.5 x 2 cm cystic lesion within the pancreatic body is noted. There is no evidence of distal pancreatic ductal dilatation or adjacent inflammation. The remainder of the pancreas is unremarkable. Spleen: Unremarkable Adrenals/Urinary Tract: The kidneys and adrenal glands are unremarkable. Circumferential bladder wall thickening is again noted. A Foley catheter within the bladder is present. Stomach/Bowel: Stomach is within normal limits. No evidence of bowel wall thickening, distention, or inflammatory changes. Vascular/Lymphatic: Aortic atherosclerosis. No enlarged abdominal or pelvic lymph nodes. Reproductive: Marked prostate enlargement noted. Other: There is mild stranding/inflammation in the low anterior RIGHT pelvic fat without adjacent bowel abnormality. This is of uncertain chronicity and significance. No abscess or pneumoperitoneum. A small to moderate supraumbilical midline ventral hernia containing fat and a small to moderate LEFT inguinal hernia containing fat again noted. Musculoskeletal: No acute or suspicious bony abnormalities. Degenerative changes within the spine again identified. IMPRESSION: 1. Mild stranding in the low anterior LEFT pelvic fat of uncertain chronicity but new since 2012. No abscess, pneumoperitoneum or free fluid. 2. Circumferential bladder wall thickening with Foley catheter within the bladder. This wall thickening may be secondary to marked prostate enlargement or may represent cystitis. Correlate clinically 3. 1.5 x 2 cm cystic lesion within the pancreatic body. Recommend follow up pre and post contrast MRI/MRCP or pancreatic protocol CT in 6 months. This recommendation follows ACR consensus guidelines: Management of Incidental Pancreatic  Cysts: A White Paper of the ACR Incidental Findings Committee. J Am Coll Radiol 20973;53:299-242 4. Unchanged supraumbilical midline ventral hernia containing fat and small to moderate LEFT inguinal hernia containing fat. 5.  Aortic Atherosclerosis (ICD10-I70.0). Electronically Signed   By: JMargarette CanadaM.D.   On: 10/27/2017 15:59    Scheduled Meds: . aspirin  81 mg Oral Daily  . carbidopa-levodopa  1 tablet Oral TID  . citalopram  30 mg Oral Daily  . dutasteride  0.5 mg Oral Daily  . enoxaparin (LOVENOX) injection  40 mg Subcutaneous Q24H  . potassium chloride  40 mEq Oral BID  . tamsulosin  0.4 mg Oral Daily    Continuous Infusions: . sodium chloride 100 mL/hr at 10/28/17 0410  . cefTRIAXone (ROCEPHIN)  IV 2 g (10/28/17 1003)     LOS: 0 days     CKayleen Memos MD [facility] Pager [phone] If 7PM-7AM, please contact night-coverage [email or web address] Password [email or web address]5/19/2019, 12:56 PM

## 2017-10-29 DIAGNOSIS — E860 Dehydration: Secondary | ICD-10-CM | POA: Diagnosis present

## 2017-10-29 DIAGNOSIS — E785 Hyperlipidemia, unspecified: Secondary | ICD-10-CM | POA: Diagnosis present

## 2017-10-29 DIAGNOSIS — R350 Frequency of micturition: Secondary | ICD-10-CM | POA: Diagnosis not present

## 2017-10-29 DIAGNOSIS — R498 Other voice and resonance disorders: Secondary | ICD-10-CM | POA: Diagnosis not present

## 2017-10-29 DIAGNOSIS — N390 Urinary tract infection, site not specified: Secondary | ICD-10-CM | POA: Diagnosis not present

## 2017-10-29 DIAGNOSIS — R319 Hematuria, unspecified: Secondary | ICD-10-CM | POA: Diagnosis not present

## 2017-10-29 DIAGNOSIS — Z7401 Bed confinement status: Secondary | ICD-10-CM | POA: Diagnosis not present

## 2017-10-29 DIAGNOSIS — G20 Parkinson's disease: Secondary | ICD-10-CM | POA: Diagnosis not present

## 2017-10-29 DIAGNOSIS — R296 Repeated falls: Secondary | ICD-10-CM | POA: Diagnosis present

## 2017-10-29 DIAGNOSIS — E782 Mixed hyperlipidemia: Secondary | ICD-10-CM | POA: Diagnosis not present

## 2017-10-29 DIAGNOSIS — M6281 Muscle weakness (generalized): Secondary | ICD-10-CM | POA: Diagnosis not present

## 2017-10-29 DIAGNOSIS — E876 Hypokalemia: Secondary | ICD-10-CM | POA: Diagnosis present

## 2017-10-29 DIAGNOSIS — Z9049 Acquired absence of other specified parts of digestive tract: Secondary | ICD-10-CM | POA: Diagnosis not present

## 2017-10-29 DIAGNOSIS — R531 Weakness: Secondary | ICD-10-CM | POA: Diagnosis not present

## 2017-10-29 DIAGNOSIS — R41841 Cognitive communication deficit: Secondary | ICD-10-CM | POA: Diagnosis not present

## 2017-10-29 DIAGNOSIS — Z79899 Other long term (current) drug therapy: Secondary | ICD-10-CM | POA: Diagnosis not present

## 2017-10-29 DIAGNOSIS — R338 Other retention of urine: Secondary | ICD-10-CM | POA: Diagnosis not present

## 2017-10-29 DIAGNOSIS — R9431 Abnormal electrocardiogram [ECG] [EKG]: Secondary | ICD-10-CM | POA: Diagnosis present

## 2017-10-29 DIAGNOSIS — M2550 Pain in unspecified joint: Secondary | ICD-10-CM | POA: Diagnosis not present

## 2017-10-29 DIAGNOSIS — F329 Major depressive disorder, single episode, unspecified: Secondary | ICD-10-CM | POA: Diagnosis present

## 2017-10-29 DIAGNOSIS — R310 Gross hematuria: Secondary | ICD-10-CM | POA: Diagnosis present

## 2017-10-29 DIAGNOSIS — N3001 Acute cystitis with hematuria: Secondary | ICD-10-CM | POA: Diagnosis not present

## 2017-10-29 DIAGNOSIS — E8881 Metabolic syndrome: Secondary | ICD-10-CM | POA: Diagnosis present

## 2017-10-29 DIAGNOSIS — R278 Other lack of coordination: Secondary | ICD-10-CM | POA: Diagnosis not present

## 2017-10-29 DIAGNOSIS — R2689 Other abnormalities of gait and mobility: Secondary | ICD-10-CM | POA: Diagnosis not present

## 2017-10-29 DIAGNOSIS — Z7982 Long term (current) use of aspirin: Secondary | ICD-10-CM | POA: Diagnosis not present

## 2017-10-29 DIAGNOSIS — Z66 Do not resuscitate: Secondary | ICD-10-CM | POA: Diagnosis present

## 2017-10-29 DIAGNOSIS — N401 Enlarged prostate with lower urinary tract symptoms: Secondary | ICD-10-CM | POA: Diagnosis not present

## 2017-10-29 LAB — COMPREHENSIVE METABOLIC PANEL
ALT: 5 U/L — AB (ref 17–63)
AST: 11 U/L — ABNORMAL LOW (ref 15–41)
Albumin: 3.2 g/dL — ABNORMAL LOW (ref 3.5–5.0)
Alkaline Phosphatase: 57 U/L (ref 38–126)
Anion gap: 10 (ref 5–15)
BUN: 10 mg/dL (ref 6–20)
CHLORIDE: 106 mmol/L (ref 101–111)
CO2: 25 mmol/L (ref 22–32)
CREATININE: 0.83 mg/dL (ref 0.61–1.24)
Calcium: 8.9 mg/dL (ref 8.9–10.3)
Glucose, Bld: 104 mg/dL — ABNORMAL HIGH (ref 65–99)
POTASSIUM: 3.7 mmol/L (ref 3.5–5.1)
Sodium: 141 mmol/L (ref 135–145)
Total Bilirubin: 0.5 mg/dL (ref 0.3–1.2)
Total Protein: 6.2 g/dL — ABNORMAL LOW (ref 6.5–8.1)

## 2017-10-29 LAB — CBC
HCT: 41.1 % (ref 39.0–52.0)
Hemoglobin: 13.5 g/dL (ref 13.0–17.0)
MCH: 27.7 pg (ref 26.0–34.0)
MCHC: 32.8 g/dL (ref 30.0–36.0)
MCV: 84.2 fL (ref 78.0–100.0)
PLATELETS: 209 10*3/uL (ref 150–400)
RBC: 4.88 MIL/uL (ref 4.22–5.81)
RDW: 13.5 % (ref 11.5–15.5)
WBC: 5.7 10*3/uL (ref 4.0–10.5)

## 2017-10-29 NOTE — Progress Notes (Signed)
PROGRESS NOTE  RODGER GIANGREGORIO NOM:[id number] DOB: 17-Mar-1946 DOA: 10/27/2017 PCP: [name], MD  HPI/Recap of past 24 hours:  Kyle Ball is a 72 y.o. male with medical history significant of Parkinson's disease, BPH, hyperlipidemia, depression, Crohn's disease status post partial small bowel resection 20 years ago located by stenosis at the anastomosis site, abnormal EKG who was brought in by his wife for weakness and falls.  Patient has had approximately 6 months of increasing weakness.  He is noted to be significantly less functional at home and requiring more assistance with his IADLs and ADLs.  He was recently seen by his Parkinson's doctor approximately 1 month ago where the dose of his Parkinson's medication was increased however he has not seen much improvement in his symptoms.  Approximately 1 week ago he began to develop dull suprapubic abdominal pain without any associated nausea or vomiting. Pt was seen by urology who placed a Foley with removal of 1.5 L of urine.  His abdominal pain resolved and he was pending outpatient follow-up to do a trial of voiding.  Approximately 2 days ago he began to have worsening weakness and return of the dull suprapubic pain.  His wife reported that twice now she has had to call EMS to the house because he while on the bathroom or while walking with fall and she was unable to get him up.  This acute on chronic weakness is fairly dramatic as she is unable to get him out of bed and she not have significant assistance at her home.  He denies any nausea, vomiting, abdominal pain, cough, congestion, rhinorrhea, chest pain, syncope/presyncope.  He has chronic lower extremity edema that is unchanged.  He has 2 episodes of explosive diarrhea a week due to his underlying Crohn's disease and the known stenosis at the anastomotic site.  ED Course: In the ED patient's vitals were unremarkable.  His labs were fairly reassuring.  His UA was concerning for  infection.  CT abdomen pelvis showed low anterior left pelvic fat stranding of unclear etiology as well as circumferential bladder wall thickening.  Of note patient was noted to have a 1.5 x 2 cystic lesion in the pancreatic body with recommendation for MRI/MRCP or pancreatic protocol CT evaluation.  Patient was admitted with UTI and concern for weakness.  10/28/2017: He has no new complaints.  RN reports GROSS hematuria in Foley bag.  Provider contacted urology Dr. Drucie Ip who recommended doing a bladder scan and Foley cath flush.  Urology will see the patient this afternoon.  10/29/17: persistent weakness but improved. Wife concerned about not being able to take care of home at home.  Assessment/Plan: Active Problems:   HYPERLIPIDEMIA   Crohn's disease of small intestine with intestinal obstruction (HCC)   BPH (benign prostatic hyperplasia)   Parkinson's disease (HCC)   Nonspecific abnormal electrocardiogram (ECG) (EKG)   Acute lower UTI   UTI (urinary tract infection)   Parkinson disease (HCC)  Generalized weakness, improving complicated by frequent falls Suspect secondary to UTI versus Parkinson disease Denies hitting his head during these episodes Treat UTI PT to evaluate and treat Fall precautions  UTI, poa Pyuria with urine WBC 11-20 On IV ceftriaxone Has indwelling Foley catheter for urine retention due to advanced BPH  Hematuria, stable Hematuria in the setting of indwelling catheter Urology consulted.  Highly appreciated Urology recommended bladder scan and Foley cath flush Hemoglobin stable at 13 Continue to monitor  Parkinson Disease Continue home medications Fall precautions  Hypokalemia, resolved repleted  HLD, stable Continue home medications  BPH, stable Continue tamsulosin Has a indwelling Foley catheter for urinary retention  Urinary retention, stable Foley cath indwelling in place No AKI or CKD  Ambulatory dysfunction with frequent falls PT to  evaluate and treat Social worker consulted for placement    Code Status: DNR  Family Communication: None at bedside  Disposition Plan: SNF when clinically stable   Consultants:  Urology  CSW  Procedures:  None  Antimicrobials:  ceftriaxone  DVT prophylaxis: Subcu Lovenox   Objective: Vitals:   10/28/17 1352 10/28/17 2120 10/29/17 0528 10/29/17 1245  BP: (!) 144/62 (!) 154/71 (!) 164/84 130/64  Pulse: 64 (!) 58 62 65  Resp:  20 20   Temp:  (!) 97.5 F (36.4 C) 97.7 F (36.5 C)   TempSrc:  Oral Oral   SpO2: 96% 98% 97% 95%  Weight:      Height:        Intake/Output Summary (Last 24 hours) at 10/29/2017 1445 Last data filed at 10/29/2017 1300 Gross per 24 hour  Intake 900 ml  Output 3725 ml  Net -2825 ml   Filed Weights   10/27/17 2133  Weight: 104 kg (229 lb 3.2 oz)    Exam: 10/29/17  . General: 72 y.o. year-old male WD WN NAD A&O x 3. Mild tremors noted on right upper extremity. . Cardiovascular: RRR no rubs or gallops. No JVD or thyromegaly. Marland Kitchen Respiratory: Clear to auscultation with no wheezes or rales. Good inspiratory effort. . Abdomen: Soft nontender nondistended with normal bowel sounds x4 quadrants. . Musculoskeletal: No lower extremity edema. 2/4 pulses in all 4 extremities. . Skin: No ulcerative lesions noted or rashes.  indwelling Foley cath in place. Marland Kitchen Psychiatry: Mood is appropriate for condition and setting   Data Reviewed: CBC: Recent Labs  Lab 10/27/17 1249 10/28/17 0352 10/29/17 0407  WBC 6.8 5.9 5.7  NEUTROABS 4.8  --   --   HGB 14.5 13.2 13.5  HCT 43.8 41.0 41.1  MCV 84.7 85.2 84.2  PLT 178 173 527   Basic Metabolic Panel: Recent Labs  Lab 10/27/17 1249 10/28/17 0352 10/29/17 0407  NA 139 141 141  K 3.4* 3.1* 3.7  CL 103 105 106  CO2 25 26 25   GLUCOSE 95 92 104*  BUN 14 12 10   CREATININE 0.97 0.89 0.83  CALCIUM 9.1 8.7* 8.9   GFR: Estimated Creatinine Clearance: 108 mL/min (by C-G formula based on SCr of  0.83 mg/dL). Liver Function Tests: Recent Labs  Lab 10/27/17 1249 10/29/17 0407  AST 12* 11*  ALT 8* 5*  ALKPHOS 64 57  BILITOT 0.9 0.5  PROT 7.0 6.2*  ALBUMIN 3.7 3.2*   Recent Labs  Lab 10/27/17 1249  LIPASE 25   No results for input(s): AMMONIA in the last 168 hours. Coagulation Profile: No results for input(s): INR, PROTIME in the last 168 hours. Cardiac Enzymes: No results for input(s): CKTOTAL, CKMB, CKMBINDEX, TROPONINI in the last 168 hours. BNP (last 3 results) No results for input(s): PROBNP in the last 8760 hours. HbA1C: No results for input(s): HGBA1C in the last 72 hours. CBG: No results for input(s): GLUCAP in the last 168 hours. Lipid Profile: No results for input(s): CHOL, HDL, LDLCALC, TRIG, CHOLHDL, LDLDIRECT in the last 72 hours. Thyroid Function Tests: No results for input(s): TSH, T4TOTAL, FREET4, T3FREE, THYROIDAB in the last 72 hours. Anemia Panel: No results for input(s): VITAMINB12, FOLATE, FERRITIN, TIBC, IRON, RETICCTPCT in the  last 72 hours. Urine analysis:    Component Value Date/Time   COLORURINE YELLOW 10/27/2017 1249   APPEARANCEUR HAZY (A) 10/27/2017 1249   LABSPEC 1.016 10/27/2017 1249   PHURINE 5.0 10/27/2017 1249   GLUCOSEU NEGATIVE 10/27/2017 1249   HGBUR LARGE (A) 10/27/2017 1249   BILIRUBINUR NEGATIVE 10/27/2017 1249   KETONESUR 5 (A) 10/27/2017 1249   PROTEINUR 30 (A) 10/27/2017 1249   NITRITE NEGATIVE 10/27/2017 1249   LEUKOCYTESUR SMALL (A) 10/27/2017 1249   Sepsis Labs: @LABRCNTIP (procalcitonin:4,lacticidven:4)  ) Recent Results (from the past 240 hour(s))  Urine culture     Status: None   Collection Time: 10/27/17 12:49 PM  Result Value Ref Range Status   Specimen Description   Final    URINE, RANDOM Performed at [facility], [place] [address] [place], [place] [address]    Special Requests   Final    NONE Performed at [facility], [place] [address], [place], [place]  [address]    Culture   Final    NO GROWTH Performed at [facility], [place] [address], [place], [place] [address]    Report Status 10/28/2017 FINAL  Final      Studies: No results found.  Scheduled Meds: . aspirin  81 mg Oral Daily  . carbidopa-levodopa  1 tablet Oral TID  . citalopram  30 mg Oral Daily  . dutasteride  0.5 mg Oral Daily  . enoxaparin (LOVENOX) injection  40 mg Subcutaneous Q24H  . tamsulosin  0.4 mg Oral Daily    Continuous Infusions: . cefTRIAXone (ROCEPHIN)  IV 2 g (10/29/17 1058)     LOS: 0 days     Kayleen Memos, MD [facility] Pager [phone]  If 7PM-7AM, please contact night-coverage [email or web address] Password [email or web address] 10/29/2017, 2:45 PM

## 2017-10-29 NOTE — Progress Notes (Signed)
Foley flushed x3 with 10 ml of Normal Saline, it continues to drain bloody urine. Reported to the night shift. shift

## 2017-10-29 NOTE — Evaluation (Signed)
Physical Therapy Evaluation Patient Details Name: Kyle Ball MRN: [id number] DOB: 1945-11-08 Today's Date: 10/29/2017   History of Present Illness  72 yo male admitted with fall, weakness, urinary retention. Hx of Parkinson's disease, falls.   Clinical Impression  On eval, pt required Mod assist for bed mobility and Min assist to stand/ambulate. He walked ~75 feet with a RW. Pt tolerated activity well. Both pt and wife report a decline in function most recently. Increased falls occurring as well. Discussed d/c plan with pt and wife. Both are in agreement that pt could benefit from a short term rehab stay to improve safety and independence with functional mobility. Recommend ST rehab. Will continue to follow and progress activity as tolerated.     Follow Up Recommendations SNF    Equipment Recommendations  None recommended by PT    Recommendations for Other Services       Precautions / Restrictions Precautions Precautions: Fall Restrictions Weight Bearing Restrictions: No      Mobility  Bed Mobility Overal bed mobility: Needs Assistance Bed Mobility: Supine to Sit     Supine to sit: Mod assist;HOB elevated     General bed mobility comments: Increased time. Assist for trunk. Pt relied on bedrails.   Transfers Overall transfer level: Needs assistance Equipment used: Rolling walker (2 wheeled) Transfers: Sit to/from Stand Sit to Stand: Min assist;From elevated surface         General transfer comment: Assist to rise, stabilize, control descent. Vcs safety, hand placement.   Ambulation/Gait Ambulation/Gait assistance: Min assist Ambulation Distance (Feet): 75 Feet Assistive device: Rolling walker (2 wheeled) Gait Pattern/deviations: Step-through pattern     General Gait Details: Assist to stabilize throughout ambulation distance. Slow gait speed. Pt tolerated distance well. No overt LOB with RW use on today.   Stairs            Wheelchair Mobility     Modified Rankin (Stroke Patients Only)       Balance Overall balance assessment: Needs assistance;History of Falls         Standing balance support: Bilateral upper extremity supported Standing balance-Leahy Scale: Poor                               Pertinent Vitals/Pain Pain Assessment: No/denies pain    Home Living Family/patient expects to be discharged to:: Skilled nursing facility Living Arrangements: Spouse/significant other Available Help at Discharge: Family Type of Home: House       Home Layout: Multi-level;1/2 bath on main level Home Equipment: None      Prior Function Level of Independence: Independent               Hand Dominance        Extremity/Trunk Assessment   Upper Extremity Assessment Upper Extremity Assessment: Generalized weakness(tremors noted intermittently)    Lower Extremity Assessment Lower Extremity Assessment: Generalized weakness    Cervical / Trunk Assessment Cervical / Trunk Assessment: Kyphotic  Communication   Communication: No difficulties  Cognition Arousal/Alertness: Awake/alert Behavior During Therapy: WFL for tasks assessed/performed Overall Cognitive Status: Within Functional Limits for tasks assessed                                        General Comments      Exercises     Assessment/Plan    PT Assessment  Patient needs continued PT services  PT Problem List Decreased balance;Decreased strength;Decreased mobility;Decreased activity tolerance;Decreased knowledge of use of DME       PT Treatment Interventions DME instruction;Gait training;Functional mobility training;Therapeutic activities;Balance training;Patient/family education;Therapeutic exercise    PT Goals (Current goals can be found in the Care Plan section)  Acute Rehab PT Goals Patient Stated Goal: to go to rehab to get stronger and more independent PT Goal Formulation: With patient Time For Goal  Achievement: 11/12/17 Potential to Achieve Goals: Good    Frequency Min 2X/week   Barriers to discharge        Co-evaluation               AM-PAC PT "6 Clicks" Daily Activity  Outcome Measure Difficulty turning over in bed (including adjusting bedclothes, sheets and blankets)?: A Lot Difficulty moving from lying on back to sitting on the side of the bed? : Unable Difficulty sitting down on and standing up from a chair with arms (e.g., wheelchair, bedside commode, etc,.)?: Unable Help needed moving to and from a bed to chair (including a wheelchair)?: A Little Help needed walking in hospital room?: A Little Help needed climbing 3-5 steps with a railing? : A Lot 6 Click Score: 12    End of Session Equipment Utilized During Treatment: Gait belt Activity Tolerance: Patient tolerated treatment well Patient left: in chair;with call bell/phone within reach;with family/visitor present   PT Visit Diagnosis: Difficulty in walking, not elsewhere classified (R26.2);History of falling (Z91.81);Muscle weakness (generalized) (M62.81);Other symptoms and signs involving the nervous system (R29.898)    Time: 8185-6314 PT Time Calculation (min) (ACUTE ONLY): 31 min   Charges:   PT Evaluation $PT Eval Moderate Complexity: 1 Mod PT Treatments $Gait Training: 8-22 mins   PT G Codes:         Weston Anna, MPT Pager: [phone]

## 2017-10-29 NOTE — Telephone Encounter (Signed)
Patient is currently admitted to the hospital. Last filled 10/09/08, #60, 1 RF.  Please advise.

## 2017-10-30 NOTE — Progress Notes (Addendum)
Followed up with pt and wife (legally ex-wife she explains) this morning re: disposition plan. Pt worked with PT yesterday- SNF recommended. Pt and wife want to pursue SNF short term but explain that they are preparing that pt will actually need long term SNF care as "his Parkinson's is worsening." Have not applied for Medicaid but have considered taking the steps necessary to work toward qualifying. CSW again had conversation explaining difference in ST SNF versus LTC and that LTC is out of pocket expense with no payor source (no LTC insurance or Medicaid). Wife indicates they are not prepared to do this yet.  Did agree to ST SNF placement "to see how he progresses," also discussed that Medicare requires 3 qualifying inpatient nights to cover any SNF cost up front (discussed this requirement 10/28/17 as well). Wife understanding, states if pt does not have qualifying stay by end of hospitalization she may consider private pay but unsure if pt would have funds for this. Made referrals and will follow up with pt/wife for bed offers- wife wants to visit facilities which make offers in order to make selection.  Sharren [place], MSW, LCSW Clinical Social Work 10/30/2017 [phone]  15:13- provided bed offers to wife- she will visit facilities tonight and in the morning to identify preferences  Received approved pasrr [id number]

## 2017-10-30 NOTE — NC FL2 (Signed)
[place] LEVEL OF CARE SCREENING TOOL     IDENTIFICATION  Patient Name: Kyle Ball Birthdate: [birth year] Sex: male Admission Date (Current Location): 10/27/2017  [facility] and [place] Number:  Herbalist and Address:  [facility],  [place], [place]      Provider Number: [id number]  Attending Physician Name and Address:  Kayleen Memos, DO  Relative Name and Phone Number:       Current Level of Care: Hospital Recommended Level of Care: [place] Prior Approval Number:    Date Approved/Denied:   PASRR Number: pending  Discharge Plan: SNF    Current Diagnoses: Patient Active Problem List   Diagnosis Date Noted  . Parkinson disease ([place]) 10/29/2017  . Acute lower UTI 10/27/2017  . UTI (urinary tract infection) 10/27/2017  . Medicare annual wellness visit, subsequent 12/04/2016  . Obesity 12/04/2016  . Hyperglycemia 02/04/2015  . Elevated blood pressure reading without diagnosis of hypertension 03/05/2013  . Nonspecific abnormal electrocardiogram (ECG) (EKG) 08/31/2011  . Parkinson's disease ([place]) 06/21/2010  . ANXIETY 07/01/2007  . BPH (benign prostatic hyperplasia) 07/01/2007  . HYPERLIPIDEMIA 04/10/2007  . DISORDER, DYSMETABOLIC SYNDROME X 25/42/7062  . DISORDER, DEPRESSIVE NEC 04/10/2007  . ELEVATED PROSTATE SPECIFIC ANTIGEN 04/10/2007  . History of non anemic vitamin B12 deficiency 10/26/2006  . ERECTILE DYSFUNCTION 10/26/2006  . Crohn's disease of small intestine with intestinal obstruction ([place]) 10/26/2006    Orientation RESPIRATION BLADDER Height & Weight     Self, Time, Situation, Place  Normal Continent, Indwelling catheter Weight: 229 lb 3.2 oz (104 kg) Height:  6\' 2"  (188 cm)  BEHAVIORAL SYMPTOMS/MOOD NEUROLOGICAL BOWEL NUTRITION STATUS      Continent Diet(regular diet)  AMBULATORY STATUS COMMUNICATION OF NEEDS Skin   Limited Assist Verbally Normal                        Personal Care Assistance Level of Assistance  Bathing, Feeding, Dressing Bathing Assistance: Limited assistance Feeding assistance: Independent Dressing Assistance: Limited assistance     Functional Limitations Info  Sight, Hearing, Speech Sight Info: Adequate Hearing Info: Adequate Speech Info: Adequate    SPECIAL CARE FACTORS FREQUENCY  PT (By licensed PT), OT (By licensed OT)     PT Frequency: 5x OT Frequency: 5x            Contractures Contractures Info: Not present    Additional Factors Info  Code Status, Allergies Code Status Info: DNR Allergies Info: penicillins           Current Medications (10/30/2017):  This is the current hospital active medication list Current Facility-Administered Medications  Medication Dose Route Frequency Provider Last Rate Last Dose  . acetaminophen (TYLENOL) tablet 650 mg  650 mg Oral Q6H PRN Purohit, Shrey C, MD       Or  . acetaminophen (TYLENOL) suppository 650 mg  650 mg Rectal Q6H PRN Purohit, Shrey C, MD      . aspirin chewable tablet 81 mg  81 mg Oral Daily Purohit, Shrey C, MD   81 mg at 10/29/17 1058  . carbidopa-levodopa (SINEMET IR) 25-250 MG per tablet immediate release 1 tablet  1 tablet Oral TID Cristy Folks, MD   1 tablet at 10/29/17 2208  . cefTRIAXone (ROCEPHIN) 2 g in sodium chloride 0.9 % 100 mL IVPB  2 g Intravenous Q24H Purohit, Konrad Dolores, MD   Stopped at 10/29/17 1130  . citalopram (CELEXA) tablet  30 mg  30 mg Oral Daily Purohit, Shrey C, MD   30 mg at 10/29/17 2208  . dutasteride (AVODART) capsule 0.5 mg  0.5 mg Oral Daily Purohit, Shrey C, MD   0.5 mg at 10/29/17 1058  . enoxaparin (LOVENOX) injection 40 mg  40 mg Subcutaneous Q24H Purohit, Shrey C, MD   40 mg at 10/29/17 2208  . naphazoline-pheniramine (NAPHCON-A) 0.025-0.3 % ophthalmic solution 1 drop  1 drop Both Eyes QID PRN Irene Pap N, DO   1 drop at 10/28/17 2110  . ondansetron (ZOFRAN) tablet 4 mg  4 mg Oral Q6H PRN Purohit, Shrey C, MD        Or  . ondansetron (ZOFRAN) injection 4 mg  4 mg Intravenous Q6H PRN Purohit, Shrey C, MD      . polyethylene glycol (MIRALAX / GLYCOLAX) packet 17 g  17 g Oral Daily PRN Purohit, Shrey C, MD      . tamsulosin (FLOMAX) capsule 0.4 mg  0.4 mg Oral Daily Purohit, Shrey C, MD   0.4 mg at 10/29/17 1058  . traMADol (ULTRAM) tablet 50 mg  50 mg Oral Q6H PRN Purohit, Konrad Dolores, MD         Discharge Medications: Please see discharge summary for a list of discharge medications.  Relevant Imaging Results:  Relevant Lab Results:   Additional Information SS# [id number]  Nila Nephew, LCSW

## 2017-10-30 NOTE — Progress Notes (Signed)
PROGRESS NOTE  Kyle Ball:[id number] DOB: 1945/08/23 DOA: 10/27/2017 PCP: [name], MD  HPI/Recap of past 24 hours:  Kyle Ball is a 72 y.o. male with medical history significant of Parkinson's disease, BPH, hyperlipidemia, depression, Crohn's disease status post partial small bowel resection 20 years ago located by stenosis at the anastomosis site, abnormal EKG who was brought in by his wife for weakness and falls.  Patient has had approximately 6 months of increasing weakness.  He is noted to be significantly less functional at home and requiring more assistance with his IADLs and ADLs.  He was recently seen by his Parkinson's doctor approximately 1 month ago where the dose of his Parkinson's medication was increased however he has not seen much improvement in his symptoms.  Approximately 1 week ago he began to develop dull suprapubic abdominal pain without any associated nausea or vomiting. Pt was seen by urology who placed a Foley with removal of 1.5 L of urine.  His abdominal pain resolved and he was pending outpatient follow-up to do a trial of voiding.  Approximately 2 days ago he began to have worsening weakness and return of the dull suprapubic pain.  His wife reported that twice now she has had to call EMS to the house because he while on the bathroom or while walking with fall and she was unable to get him up.  This acute on chronic weakness is fairly dramatic as she is unable to get him out of bed and she not have significant assistance at her home.  He denies any nausea, vomiting, abdominal pain, cough, congestion, rhinorrhea, chest pain, syncope/presyncope.  He has chronic lower extremity edema that is unchanged.  He has 2 episodes of explosive diarrhea a week due to his underlying Crohn's disease and the known stenosis at the anastomotic site.  ED Course: In the ED patient's vitals were unremarkable.  His labs were fairly reassuring.  His UA was concerning for  infection.  CT abdomen pelvis showed low anterior left pelvic fat stranding of unclear etiology as well as circumferential bladder wall thickening.  Of note patient was noted to have a 1.5 x 2 cystic lesion in the pancreatic body with recommendation for MRI/MRCP or pancreatic protocol CT evaluation.  Patient was admitted with UTI and concern for weakness.  10/28/2017: He has no new complaints.  RN reports GROSS hematuria in Foley bag.  Provider contacted urology Dr. Drucie Ip who recommended doing a bladder scan and Foley cath flush.  Urology will see the patient this afternoon.  10/29/17: persistent weakness but improved. Wife concerned about not being able to take care of home at home.  10/30/17; Intermittent hematuria. Has indwelling foley cath in place. Foley flush per nursing.  Assessment/Plan: Active Problems:   HYPERLIPIDEMIA   Crohn's disease of small intestine with intestinal obstruction (HCC)   BPH (benign prostatic hyperplasia)   Parkinson's disease (HCC)   Nonspecific abnormal electrocardiogram (ECG) (EKG)   Acute lower UTI   UTI (urinary tract infection)   Parkinson disease (HCC)  Generalized weakness complicated by frequent falls Suspect secondary to UTI versus Parkinson disease Denies hitting his head during these episodes Treat UTI PT to evaluate and treat Fall precautions  UTI, poa Pyuria with urine WBC 11-20 Symptoms improved afetr starting IV antibiotic C/w IV ceftriaxone Has indwelling Foley catheter for urine retention due to advanced BPH  Acute dehydration due to poor oral intake Start gentle IV hydration Encourage oral fluid intake  Recurrent hematuria Flush  foley as recommended by urology Urology consulted.  Highly appreciated Urology recommended bladder scan and Foley cath flush Hemoglobin stable  Continue to monitor  Parkinson disease Continue home medications Fall precautions  Chron's disease Not in flare  Hypokalemia,  resolved repleted  HLD Continue home medications  BPH Continue tamsulosin Has a indwelling Foley catheter for urinary retention  Urinary retention Foley cath indwelling in place No AKI or CKD  Ambulatory dysfunction with frequent falls PT to evaluate and treat Social worker consulted for placement    Code Status: DNR  Family Communication: None at bedside  Disposition Plan: SNF when clinically stable   Consultants:  Urology  CSW  Procedures:  None  Antimicrobials:  ceftriaxone  DVT prophylaxis: Subcu Lovenox   Objective: Vitals:   10/29/17 2203 10/30/17 0609 10/30/17 1434 10/30/17 2024  BP: (!) 158/83 (!) 159/95 (!) 143/74 140/72  Pulse: 63 66 62 64  Resp: 19 17 16    Temp: 98.4 F (36.9 C) 98 F (36.7 C) 97.8 F (36.6 C) 97.7 F (36.5 C)  TempSrc: Oral Oral Oral Oral  SpO2: 98% 98% 98% 96%  Weight:      Height:        Intake/Output Summary (Last 24 hours) at 10/30/2017 2041 Last data filed at 10/30/2017 1852 Gross per 24 hour  Intake 1080 ml  Output 2000 ml  Net -920 ml   Filed Weights   10/27/17 2133  Weight: 104 kg (229 lb 3.2 oz)    Exam: 10/30/17  . General: 72 y.o. year-old male WD WN NAD A&O x 3. Tremors noted in right hand at rest/\ . Cardiovascular: RRR no rubs or gallops. No JCD or thyromegaly. Marland Kitchen Respiratory: Clear to auscultation with no wheezes or rales. Good inspiratory effort. . Abdomen: Soft nontender nondistended with normal bowel sounds x4 quadrants. . Musculoskeletal: No lower extremity edema. 2/4 pulses in all 4 extremities. . Skin: No ulcerative lesions noted or rashes.  indwelling Foley cath in place. Marland Kitchen Psychiatry: Mood is appropriate for condition and setting   Data Reviewed: CBC: Recent Labs  Lab 10/27/17 1249 10/28/17 0352 10/29/17 0407  WBC 6.8 5.9 5.7  NEUTROABS 4.8  --   --   HGB 14.5 13.2 13.5  HCT 43.8 41.0 41.1  MCV 84.7 85.2 84.2  PLT 178 173 629   Basic Metabolic Panel: Recent Labs  Lab  10/27/17 1249 10/28/17 0352 10/29/17 0407  NA 139 141 141  K 3.4* 3.1* 3.7  CL 103 105 106  CO2 25 26 25   GLUCOSE 95 92 104*  BUN 14 12 10   CREATININE 0.97 0.89 0.83  CALCIUM 9.1 8.7* 8.9   GFR: Estimated Creatinine Clearance: 108 mL/min (by C-G formula based on SCr of 0.83 mg/dL). Liver Function Tests: Recent Labs  Lab 10/27/17 1249 10/29/17 0407  AST 12* 11*  ALT 8* 5*  ALKPHOS 64 57  BILITOT 0.9 0.5  PROT 7.0 6.2*  ALBUMIN 3.7 3.2*   Recent Labs  Lab 10/27/17 1249  LIPASE 25   No results for input(s): AMMONIA in the last 168 hours. Coagulation Profile: No results for input(s): INR, PROTIME in the last 168 hours. Cardiac Enzymes: No results for input(s): CKTOTAL, CKMB, CKMBINDEX, TROPONINI in the last 168 hours. BNP (last 3 results) No results for input(s): PROBNP in the last 8760 hours. HbA1C: No results for input(s): HGBA1C in the last 72 hours. CBG: No results for input(s): GLUCAP in the last 168 hours. Lipid Profile: No results for input(s): CHOL, HDL,  LDLCALC, TRIG, CHOLHDL, LDLDIRECT in the last 72 hours. Thyroid Function Tests: No results for input(s): TSH, T4TOTAL, FREET4, T3FREE, THYROIDAB in the last 72 hours. Anemia Panel: No results for input(s): VITAMINB12, FOLATE, FERRITIN, TIBC, IRON, RETICCTPCT in the last 72 hours. Urine analysis:    Component Value Date/Time   COLORURINE YELLOW 10/27/2017 1249   APPEARANCEUR HAZY (A) 10/27/2017 1249   LABSPEC 1.016 10/27/2017 1249   PHURINE 5.0 10/27/2017 1249   GLUCOSEU NEGATIVE 10/27/2017 1249   HGBUR LARGE (A) 10/27/2017 1249   BILIRUBINUR NEGATIVE 10/27/2017 1249   KETONESUR 5 (A) 10/27/2017 1249   PROTEINUR 30 (A) 10/27/2017 1249   NITRITE NEGATIVE 10/27/2017 1249   LEUKOCYTESUR SMALL (A) 10/27/2017 1249   Sepsis Labs: @LABRCNTIP (procalcitonin:4,lacticidven:4)  ) Recent Results (from the past 240 hour(s))  Urine culture     Status: None   Collection Time: 10/27/17 12:49 PM  Result Value  Ref Range Status   Specimen Description   Final    URINE, RANDOM Performed at [facility], [place] [address] [place], [place] [address]    Special Requests   Final    NONE Performed at [facility], [place] [address], [place], [place] [address]    Culture   Final    NO GROWTH Performed at [facility], [place] [address], [place],  [address]    Report Status 10/28/2017 FINAL  Final      Studies: No results found.  Scheduled Meds: . aspirin  81 mg Oral Daily  . carbidopa-levodopa  1 tablet Oral TID  . citalopram  30 mg Oral Daily  . dutasteride  0.5 mg Oral Daily  . enoxaparin (LOVENOX) injection  40 mg Subcutaneous Q24H  . tamsulosin  0.4 mg Oral Daily    Continuous Infusions: . cefTRIAXone (ROCEPHIN)  IV Stopped (10/30/17 1218)     LOS: 1 day     Kayleen Memos, MD [facility] Pager [phone]  If 7PM-7AM, please contact night-coverage [email or web address] Password [facility] 10/30/2017, 8:41 PM

## 2017-10-31 DIAGNOSIS — N3001 Acute cystitis with hematuria: Secondary | ICD-10-CM

## 2017-10-31 DIAGNOSIS — G20 Parkinson's disease: Secondary | ICD-10-CM

## 2017-10-31 LAB — CBC
HCT: 43.4 % (ref 39.0–52.0)
Hemoglobin: 14.4 g/dL (ref 13.0–17.0)
MCH: 27.9 pg (ref 26.0–34.0)
MCHC: 33.2 g/dL (ref 30.0–36.0)
MCV: 84.1 fL (ref 78.0–100.0)
Platelets: 232 10*3/uL (ref 150–400)
RBC: 5.16 MIL/uL (ref 4.22–5.81)
RDW: 13.2 % (ref 11.5–15.5)
WBC: 7.1 10*3/uL (ref 4.0–10.5)

## 2017-10-31 LAB — BASIC METABOLIC PANEL
ANION GAP: 11 (ref 5–15)
BUN: 13 mg/dL (ref 6–20)
CALCIUM: 8.8 mg/dL — AB (ref 8.9–10.3)
CO2: 26 mmol/L (ref 22–32)
Chloride: 101 mmol/L (ref 101–111)
Creatinine, Ser: 0.83 mg/dL (ref 0.61–1.24)
GFR calc Af Amer: >60 mL/min (ref >60)
GLUCOSE: 109 mg/dL — AB (ref 65–99)
POTASSIUM: 3.5 mmol/L (ref 3.5–5.1)
SODIUM: 138 mmol/L (ref 135–145)

## 2017-10-31 NOTE — Progress Notes (Signed)
PROGRESS NOTE    Kyle Ball  AYT:[id number] DOB: 04/05/1946 DOA: 10/27/2017 PCP: [name], MD    Brief Narrative:  72 year old male presented with weakness and falls.  He does have significant past medical history of Parkinson's disease, BPH, dyslipidemia, depression and chronic disease.  Patient reported 6 months of progressive weakness, decreased physical functional capacity, symptoms attributed to worsening Parkinson's disease.  1 week prior to hospitalization, he developed urinary retention, Foley catheter was placed.  Over the last 2 days his weakness had rapidly progressed to the point where he he was having difficulty getting out of bed.  On the initial physical examination blood pressure 145/65, heart rate 59, respiratory 16, temperature 97.8, oxygen saturation 98%.  Dry mucous membranes, lungs clear to auscultation bilaterally, heart S1-S2 present irregularly irregular, 2 out of 6 aortic murmur, abdomen soft nontender, no lower extremity edema.  Positive cogwheel rigidity and masked face.  Sodium 139, potassium 3.4, chloride 1 3, bicarb 25, glucose 95, BUN 14, creatinine 0.97, white count 6.8, hemoglobin 14.5, hematocrit 43.8, platelets 178.  Urinalysis specific gravity 1.016, protein 30, red cells greater than 50, white cells 11-20.  EKG sinus rhythm, positive PACs, right bundle branch block, left axis deviation, left anterior  fascicular block.   Patient was admitted to the hospital and diagnosis of severe generalized weakness complicated by urinary tract infection.     Assessment & Plan:   Active Problems:   HYPERLIPIDEMIA   Crohn's disease of small intestine with intestinal obstruction (HCC)   BPH (benign prostatic hyperplasia)   Parkinson's disease (HCC)   Nonspecific abnormal electrocardiogram (ECG) (EKG)   Acute lower UTI   UTI (urinary tract infection)   Parkinson disease ([place])   1.  Perkinson's disease with generalized weakness. Patient hemodynamically  stable, waiting for placement at snf. Continue neuro checks per unit protocol. Continue sinemet.   2.  Urinary tract infection. Continue antibiotic therapy with IV cefrtiaxone, cultutres have been no growth. Positive hematuria, on foley bag, possible foley trauma. Wbc at 7.1.   3.  BPH with urinary retention with hematuria. Keep foley catheter for now, dc aspirin. Continue dutasteride and tamsulosin.   4.  Dyslipidemia. Continue statin therapy   5.  Crohn's disease. No diarrhea or abdominal pain.   6. Depression. Continue citalopram.   DVT prophylaxis: scd  Code Status:  dnr Family Communication: I spoke with patient's wife at the bedside and all questions were addressed.  Disposition Plan: snf    Consultants:     Procedures:     Antimicrobials:   IV ceftriaxone    Subjective: Patient out of bed to the chair, no nausea or vomiting, no chest pain or dyspnea, reports improvement in weakness, but not back to baseline.   Objective: Vitals:   10/30/17 0609 10/30/17 1434 10/30/17 2024 10/31/17 0556  BP: (!) 159/95 (!) 143/74 140/72 (!) 152/86  Pulse: 66 62 64 67  Resp: 17 16 18 16   Temp: 98 F (36.7 C) 97.8 F (36.6 C) 97.7 F (36.5 C) 98.1 F (36.7 C)  TempSrc: Oral Oral Oral Axillary  SpO2: 98% 98% 96% 97%  Weight:      Height:        Intake/Output Summary (Last 24 hours) at 10/31/2017 0921 Last data filed at 10/31/2017 0800 Gross per 24 hour  Intake 580 ml  Output 2275 ml  Net -1695 ml   Filed Weights   10/27/17 2133  Weight: 104 kg (229 lb 3.2 oz)  Examination:   General: deconditioned  Neurology: Awake and alert, non focal  E ENT: mild pallor, no icterus, oral mucosa moist Cardiovascular: No JVD. S1-S2 present, rhythmic, no gallops, rubs, or murmurs. No lower extremity edema. Pulmonary: vesicular breath sounds bilaterally, adequate air movement, no wheezing, rhonchi or rales. Gastrointestinal. Abdomen with no organomegaly, non tender, no rebound  or guarding Skin. No rashes Musculoskeletal: no joint deformities     Data Reviewed: I have personally reviewed following labs and imaging studies  CBC: Recent Labs  Lab 10/27/17 1249 10/28/17 0352 10/29/17 0407 10/31/17 0354  WBC 6.8 5.9 5.7 7.1  NEUTROABS 4.8  --   --   --   HGB 14.5 13.2 13.5 14.4  HCT 43.8 41.0 41.1 43.4  MCV 84.7 85.2 84.2 84.1  PLT 178 173 209 060   Basic Metabolic Panel: Recent Labs  Lab 10/27/17 1249 10/28/17 0352 10/29/17 0407 10/31/17 0354  NA 139 141 141 138  K 3.4* 3.1* 3.7 3.5  CL 103 105 106 101  CO2 25 26 25 26   GLUCOSE 95 92 104* 109*  BUN 14 12 10 13   CREATININE 0.97 0.89 0.83 0.83  CALCIUM 9.1 8.7* 8.9 8.8*   GFR: Estimated Creatinine Clearance: 108 mL/min (by C-G formula based on SCr of 0.83 mg/dL). Liver Function Tests: Recent Labs  Lab 10/27/17 1249 10/29/17 0407  AST 12* 11*  ALT 8* 5*  ALKPHOS 64 57  BILITOT 0.9 0.5  PROT 7.0 6.2*  ALBUMIN 3.7 3.2*   Recent Labs  Lab 10/27/17 1249  LIPASE 25   No results for input(s): AMMONIA in the last 168 hours. Coagulation Profile: No results for input(s): INR, PROTIME in the last 168 hours. Cardiac Enzymes: No results for input(s): CKTOTAL, CKMB, CKMBINDEX, TROPONINI in the last 168 hours. BNP (last 3 results) No results for input(s): PROBNP in the last 8760 hours. HbA1C: No results for input(s): HGBA1C in the last 72 hours. CBG: No results for input(s): GLUCAP in the last 168 hours. Lipid Profile: No results for input(s): CHOL, HDL, LDLCALC, TRIG, CHOLHDL, LDLDIRECT in the last 72 hours. Thyroid Function Tests: No results for input(s): TSH, T4TOTAL, FREET4, T3FREE, THYROIDAB in the last 72 hours. Anemia Panel: No results for input(s): VITAMINB12, FOLATE, FERRITIN, TIBC, IRON, RETICCTPCT in the last 72 hours.    Radiology Studies: I have reviewed all of the imaging during this hospital visit personally     Scheduled Meds: . aspirin  81 mg Oral Daily  .  carbidopa-levodopa  1 tablet Oral TID  . citalopram  30 mg Oral Daily  . dutasteride  0.5 mg Oral Daily  . enoxaparin (LOVENOX) injection  40 mg Subcutaneous Q24H  . tamsulosin  0.4 mg Oral Daily   Continuous Infusions: . cefTRIAXone (ROCEPHIN)  IV Stopped (10/30/17 1218)     LOS: 2 days        Kyle Ball Apley, MD [facility] Pager [phone]

## 2017-10-31 NOTE — Progress Notes (Signed)
Physical Therapy Treatment Patient Details Name: Kyle Ball MRN: [id number] DOB: [birth year] Today's Date: 10/31/2017    History of Present Illness 72 yo male admitted with fall, weakness, urinary retention. Hx of Parkinson's disease, falls.     PT Comments    Progressing slowly with mobility. Pt appeared more stiff and movement slower today compared to last session. Continue to recommend SNF.    Follow Up Recommendations  SNF     Equipment Recommendations  None recommended by PT    Recommendations for Other Services       Precautions / Restrictions Precautions Precautions: Fall Restrictions Weight Bearing Restrictions: No    Mobility  Bed Mobility Overal bed mobility: Needs Assistance Bed Mobility: Supine to Sit     Supine to sit: HOB elevated;Mod assist     General bed mobility comments: Increased time. Assist for trunk. Pt relied on bedrails.   Transfers Overall transfer level: Needs assistance Equipment used: Rolling walker (2 wheeled) Transfers: Sit to/from Stand Sit to Stand: Min assist;From elevated surface         General transfer comment: Assist to rise, stabilize, control descent. Vcs safety, hand placement. Increased time.   Ambulation/Gait Ambulation/Gait assistance: Min assist Ambulation Distance (Feet): 80 Feet Assistive device: Rolling walker (2 wheeled) Gait Pattern/deviations: Step-through pattern;Decreased stride length;Trunk flexed     General Gait Details: Assist to stabilize throughout ambulation distance. Slow gait speed. VCs for increased step length.    Stairs             Wheelchair Mobility    Modified Rankin (Stroke Patients Only)       Balance Overall balance assessment: Needs assistance;History of Falls         Standing balance support: Bilateral upper extremity supported Standing balance-Leahy Scale: Poor                              Cognition Arousal/Alertness: Awake/alert Behavior  During Therapy: WFL for tasks assessed/performed Overall Cognitive Status: Within Functional Limits for tasks assessed                                        Exercises      General Comments        Pertinent Vitals/Pain Pain Assessment: No/denies pain    Home Living                      Prior Function            PT Goals (current goals can now be found in the care plan section) Progress towards PT goals: Progressing toward goals    Frequency    Min 2X/week      PT Plan Current plan remains appropriate    Co-evaluation              AM-PAC PT "6 Clicks" Daily Activity  Outcome Measure  Difficulty turning over in bed (including adjusting bedclothes, sheets and blankets)?: A Lot Difficulty moving from lying on back to sitting on the side of the bed? : Unable Difficulty sitting down on and standing up from a chair with arms (e.g., wheelchair, bedside commode, etc,.)?: Unable Help needed moving to and from a bed to chair (including a wheelchair)?: A Little Help needed walking in hospital room?: A Little Help needed climbing 3-5 steps with a railing? :  A Lot 6 Click Score: 12    End of Session Equipment Utilized During Treatment: Gait belt Activity Tolerance: Patient tolerated treatment well Patient left: in chair;with call bell/phone within reach;with chair alarm set   PT Visit Diagnosis: Difficulty in walking, not elsewhere classified (R26.2);History of falling (Z91.81);Muscle weakness (generalized) (M62.81);Other symptoms and signs involving the nervous system (R29.898)     Time: 3785-8850 PT Time Calculation (min) (ACUTE ONLY): 28 min  Charges:  $Gait Training: 23-37 mins                    G Codes:          Weston Anna, MPT Pager: [phone]

## 2017-10-31 NOTE — Progress Notes (Signed)
Pt states wife is out visiting SNFs this morning to make selections. CSW left voicemail for wife to follow up.  Sharren Bridge, MSW, LCSW Clinical Social Work 10/31/2017 [phone]

## 2017-11-01 DIAGNOSIS — G20 Parkinson's disease: Secondary | ICD-10-CM | POA: Diagnosis not present

## 2017-11-01 DIAGNOSIS — R338 Other retention of urine: Secondary | ICD-10-CM

## 2017-11-01 DIAGNOSIS — R319 Hematuria, unspecified: Secondary | ICD-10-CM

## 2017-11-01 DIAGNOSIS — M2550 Pain in unspecified joint: Secondary | ICD-10-CM | POA: Diagnosis not present

## 2017-11-01 DIAGNOSIS — N401 Enlarged prostate with lower urinary tract symptoms: Secondary | ICD-10-CM

## 2017-11-01 DIAGNOSIS — R498 Other voice and resonance disorders: Secondary | ICD-10-CM | POA: Diagnosis not present

## 2017-11-01 DIAGNOSIS — Z7401 Bed confinement status: Secondary | ICD-10-CM | POA: Diagnosis not present

## 2017-11-01 DIAGNOSIS — K5080 Crohn's disease of both small and large intestine without complications: Secondary | ICD-10-CM | POA: Diagnosis not present

## 2017-11-01 DIAGNOSIS — R82998 Other abnormal findings in urine: Secondary | ICD-10-CM | POA: Diagnosis not present

## 2017-11-01 DIAGNOSIS — N390 Urinary tract infection, site not specified: Secondary | ICD-10-CM | POA: Diagnosis not present

## 2017-11-01 DIAGNOSIS — R296 Repeated falls: Secondary | ICD-10-CM | POA: Diagnosis not present

## 2017-11-01 DIAGNOSIS — R278 Other lack of coordination: Secondary | ICD-10-CM | POA: Diagnosis not present

## 2017-11-01 DIAGNOSIS — R41841 Cognitive communication deficit: Secondary | ICD-10-CM | POA: Diagnosis not present

## 2017-11-01 DIAGNOSIS — R2689 Other abnormalities of gait and mobility: Secondary | ICD-10-CM | POA: Diagnosis not present

## 2017-11-01 DIAGNOSIS — E785 Hyperlipidemia, unspecified: Secondary | ICD-10-CM | POA: Diagnosis not present

## 2017-11-01 DIAGNOSIS — R339 Retention of urine, unspecified: Secondary | ICD-10-CM | POA: Diagnosis not present

## 2017-11-01 DIAGNOSIS — M6281 Muscle weakness (generalized): Secondary | ICD-10-CM | POA: Diagnosis not present

## 2017-11-01 LAB — CBC WITH DIFFERENTIAL/PLATELET
Basophils Absolute: 0 10*3/uL (ref 0.0–0.1)
Basophils Relative: 0 %
EOS PCT: 4 %
Eosinophils Absolute: 0.3 10*3/uL (ref 0.0–0.7)
HCT: 43.6 % (ref 39.0–52.0)
HEMOGLOBIN: 14.2 g/dL (ref 13.0–17.0)
LYMPHS ABS: 1.8 10*3/uL (ref 0.7–4.0)
LYMPHS PCT: 26 %
MCH: 27.6 pg (ref 26.0–34.0)
MCHC: 32.6 g/dL (ref 30.0–36.0)
MCV: 84.8 fL (ref 78.0–100.0)
Monocytes Absolute: 0.6 10*3/uL (ref 0.1–1.0)
Monocytes Relative: 8 %
Neutro Abs: 4.2 10*3/uL (ref 1.7–7.7)
Neutrophils Relative %: 62 %
Platelets: 223 10*3/uL (ref 150–400)
RBC: 5.14 MIL/uL (ref 4.22–5.81)
RDW: 13.2 % (ref 11.5–15.5)
WBC: 6.9 10*3/uL (ref 4.0–10.5)

## 2017-11-01 LAB — BASIC METABOLIC PANEL
ANION GAP: 11 (ref 5–15)
BUN: 19 mg/dL (ref 6–20)
CALCIUM: 9 mg/dL (ref 8.9–10.3)
CO2: 27 mmol/L (ref 22–32)
CREATININE: 0.94 mg/dL (ref 0.61–1.24)
Chloride: 102 mmol/L (ref 101–111)
Glucose, Bld: 99 mg/dL (ref 65–99)
Potassium: 3.7 mmol/L (ref 3.5–5.1)
SODIUM: 140 mmol/L (ref 135–145)

## 2017-11-01 MED ORDER — CEFDINIR 300 MG PO CAPS: 300.0000 mg | ORAL_CAPSULE | Freq: Two times a day (BID) | ORAL | 0 refills | Status: AC

## 2017-11-01 MED ORDER — PNEUMOCOCCAL VAC POLYVALENT 25 MCG/0.5ML IJ INJ: 0.5000 mL | INJECTION | INTRAMUSCULAR | Status: DC

## 2017-11-01 NOTE — Progress Notes (Signed)
Pt's wife Government social research officer (private room) SNF. CSW spoke with admissions to confirm- will facilitate transition there at DC.  Sharren Bridge, MSW, LCSW Clinical Social Work 11/01/2017 [phone]

## 2017-11-01 NOTE — Discharge Summary (Signed)
Physician Discharge Summary  DEEP BONAWITZ PPI:[id number] DOB: 1946-03-21 DOA: 10/27/2017  PCP: [name], MD  Admit date: 10/27/2017 Discharge date: 11/01/2017  Time spent: 35 minutes  Recommendations for Outpatient Follow-up:  1. Ensure patient f/u with urology for further evaluation and recommendations   Discharge Diagnoses:  Active Problems:   HYPERLIPIDEMIA   Crohn's disease of small intestine with intestinal obstruction (HCC)   BPH (benign prostatic hyperplasia)   Parkinson's disease (HCC)   Nonspecific abnormal electrocardiogram (ECG) (EKG)   Acute lower UTI   UTI (urinary tract infection)   Parkinson disease ([place])   Discharge Condition: stable  Diet recommendation: regular diet  Filed Weights   10/27/17 2133  Weight: 104 kg (229 lb 3.2 oz)    History of present illness:  72 year old male presented with weakness and falls.  He does have significant past medical history of Parkinson's disease, BPH, dyslipidemia, depression and chronic disease.  Patient reported 6 months of progressive weakness, decreased physical functional capacity, symptoms attributed to worsening Parkinson's disease  Patient was admitted to the hospital and diagnosis of severe generalized weakness complicated by urinary tract infection.   Hospital Course:  1.  Perkinson's disease with generalized weakness. Patient hemodynamically stable, continue prior to admission medication regimen.  2.  Urinary tract infection. Continue antibiotic therapy with antibiotic for 1 more day on d/c to complete appropriate antibiotic course.  3.  BPH with urinary retention with hematuria. Keep foley catheter for now, dc aspirin.   Urology consulted and recommended the following: The patient's urinary retention is likely multifactorial and probably a component of bladder outlet obstruction and the associated side effect of the Parkinson's medications.  This is fairly common in patients with advanced  Parkinson's disease.  He is scheduled to follow-up in clinic in 2 weeks for urodynamics.  He then will follow-up with me for treatment options of his retention.  At that time, we can further evaluate the hematuria.  4.  Dyslipidemia. Continue statin therapy   5.  Crohn's disease. No diarrhea or abdominal pain.   6. Depression. Continue citalopram.    Procedures:  none  Consultations:  Urology  Discharge Exam: Vitals:   10/31/17 2041 11/01/17 0452  BP: (!) 98/52 136/72  Pulse: 68 65  Resp: 16 16  Temp: 98.1 F (36.7 C) 98 F (36.7 C)  SpO2: 98% 98%    General: Pt in nad, alert and awake Cardiovascular: rrr, no rubs Respiratory: no increased wob, no wheezes  Discharge Instructions   Discharge Instructions    Diet - low sodium heart healthy   Complete by:  As directed    Discharge instructions   Complete by:  As directed    Please be sure to follow up with your primary care physician at the SNF.   Increase activity slowly   Complete by:  As directed      Allergies as of 11/01/2017      Reactions   Penicillins    CHILDHOOD ALLERGY Has patient had a PCN reaction causing immediate rash, facial/tongue/throat swelling, SOB or lightheadedness with hypotension: Yes Has patient had a PCN reaction causing severe rash involving mucus membranes or skin necrosis: No Has patient had a PCN reaction that required hospitalization: No Has patient had a PCN reaction occurring within the last 10 years:No If all of the above answers are "NO", then may proceed with Cephalosporin use.      Medication List    STOP taking these medications   aspirin 81 MG  tablet   ibuprofen 800 MG tablet Commonly known as:  ADVIL,MOTRIN     TAKE these medications   atorvastatin 20 MG tablet Commonly known as:  LIPITOR Take 1 tablet (20 mg total) daily by mouth. Needs to establish with new PCP for more refills.   AVODART 0.5 MG capsule Generic drug:  dutasteride Take 0.5 mg by mouth  daily.   carbidopa-levodopa 25-250 MG tablet Commonly known as:  SINEMET IR Take 1 tablet by mouth 3 (three) times daily.   cefdinir 300 MG capsule Commonly known as:  OMNICEF Take 1 capsule (300 mg total) by mouth 2 (two) times daily for 1 day. Start taking on:  11/02/2017   citalopram 20 MG tablet Commonly known as:  CELEXA Take 30 mg by mouth daily.   FLOMAX 0.4 MG Caps capsule Generic drug:  tamsulosin Take 0.4 mg by mouth daily.      Allergies  Allergen Reactions  . Penicillins     CHILDHOOD ALLERGY Has patient had a PCN reaction causing immediate rash, facial/tongue/throat swelling, SOB or lightheadedness with hypotension: Yes Has patient had a PCN reaction causing severe rash involving mucus membranes or skin necrosis: No Has patient had a PCN reaction that required hospitalization: No Has patient had a PCN reaction occurring within the last 10 years:No If all of the above answers are "NO", then may proceed with Cephalosporin use.       The results of significant diagnostics from this hospitalization (including imaging, microbiology, ancillary and laboratory) are listed below for reference.    Significant Diagnostic Studies: Ct Abdomen Pelvis W Contrast  Result Date: 10/27/2017 CLINICAL DATA:  72 year old male with acute abdominal pain and distension today. History of urinary retention and Foley catheter. EXAM: CT ABDOMEN AND PELVIS WITH CONTRAST TECHNIQUE: Multidetector CT imaging of the abdomen and pelvis was performed using the standard protocol following bolus administration of intravenous contrast. CONTRAST:  135m ISOVUE-300 IOPAMIDOL (ISOVUE-300) INJECTION 61% COMPARISON:  04/05/2011 and 06/24/2004 CTs FINDINGS: Lower chest: No acute abnormality. Minimal bibasilar atelectasis/scarring noted. Hepatobiliary: The liver and gallbladder are unremarkable. No biliary dilatation. Pancreas: A 1.5 x 2 cm cystic lesion within the pancreatic body is noted. There is no  evidence of distal pancreatic ductal dilatation or adjacent inflammation. The remainder of the pancreas is unremarkable. Spleen: Unremarkable Adrenals/Urinary Tract: The kidneys and adrenal glands are unremarkable. Circumferential bladder wall thickening is again noted. A Foley catheter within the bladder is present. Stomach/Bowel: Stomach is within normal limits. No evidence of bowel wall thickening, distention, or inflammatory changes. Vascular/Lymphatic: Aortic atherosclerosis. No enlarged abdominal or pelvic lymph nodes. Reproductive: Marked prostate enlargement noted. Other: There is mild stranding/inflammation in the low anterior RIGHT pelvic fat without adjacent bowel abnormality. This is of uncertain chronicity and significance. No abscess or pneumoperitoneum. A small to moderate supraumbilical midline ventral hernia containing fat and a small to moderate LEFT inguinal hernia containing fat again noted. Musculoskeletal: No acute or suspicious bony abnormalities. Degenerative changes within the spine again identified. IMPRESSION: 1. Mild stranding in the low anterior LEFT pelvic fat of uncertain chronicity but new since 2012. No abscess, pneumoperitoneum or free fluid. 2. Circumferential bladder wall thickening with Foley catheter within the bladder. This wall thickening may be secondary to marked prostate enlargement or may represent cystitis. Correlate clinically 3. 1.5 x 2 cm cystic lesion within the pancreatic body. Recommend follow up pre and post contrast MRI/MRCP or pancreatic protocol CT in 6 months. This recommendation follows ACR consensus guidelines: Management of Incidental Pancreatic  Cysts: A White Paper of the ACR Incidental Findings Committee. J Am Coll Radiol 7357;89:784-784. 4. Unchanged supraumbilical midline ventral hernia containing fat and small to moderate LEFT inguinal hernia containing fat. 5.  Aortic Atherosclerosis (ICD10-I70.0). Electronically Signed   By: Margarette Canada M.D.   On:  10/27/2017 15:59    Microbiology: Recent Results (from the past 240 hour(s))  Urine culture     Status: None   Collection Time: 10/27/17 12:49 PM  Result Value Ref Range Status   Specimen Description   Final    URINE, RANDOM Performed at [facility] [address], [place], [place] [address]    Special Requests   Final    NONE Performed at [facility], [place] [address] [place], [place] [address]    Culture   Final    NO GROWTH Performed at [facility], [place] [address] [place], [place] [address]    Report Status 10/28/2017 FINAL  Final     Labs: Basic Metabolic Panel: Recent Labs  Lab 10/27/17 1249 10/28/17 0352 10/29/17 0407 10/31/17 0354 11/01/17 0337  NA 139 141 141 138 140  K 3.4* 3.1* 3.7 3.5 3.7  CL 103 105 106 101 102  CO2 25 26 25 26 27   GLUCOSE 95 92 104* 109* 99  BUN 14 12 10 13 19   CREATININE 0.97 0.89 0.83 0.83 0.94  CALCIUM 9.1 8.7* 8.9 8.8* 9.0   Liver Function Tests: Recent Labs  Lab 10/27/17 1249 10/29/17 0407  AST 12* 11*  ALT 8* 5*  ALKPHOS 64 57  BILITOT 0.9 0.5  PROT 7.0 6.2*  ALBUMIN 3.7 3.2*   Recent Labs  Lab 10/27/17 1249  LIPASE 25   No results for input(s): AMMONIA in the last 168 hours. CBC: Recent Labs  Lab 10/27/17 1249 10/28/17 0352 10/29/17 0407 10/31/17 0354 11/01/17 0337  WBC 6.8 5.9 5.7 7.1 6.9  NEUTROABS 4.8  --   --   --  4.2  HGB 14.5 13.2 13.5 14.4 14.2  HCT 43.8 41.0 41.1 43.4 43.6  MCV 84.7 85.2 84.2 84.1 84.8  PLT 178 173 209 232 223   Cardiac Enzymes: No results for input(s): CKTOTAL, CKMB, CKMBINDEX, TROPONINI in the last 168 hours. BNP: BNP (last 3 results) No results for input(s): BNP in the last 8760 hours.  ProBNP (last 3 results) No results for input(s): PROBNP in the last 8760 hours.  CBG: No results for input(s): GLUCAP in the last 168 hours.   Signed:  Velvet Bathe MD.  [facility] 11/01/2017, 3:02 PM

## 2017-11-01 NOTE — Care Management Important Message (Signed)
Important Message  Patient Details  Name: Kyle Ball MRN: [id number] Date of Birth: Aug 15, 1945   Medicare Important Message Given:  Yes    Kerin Salen 11/01/2017, 11:11 AMImportant Message  Patient Details  Name: Kyle Ball MRN: [id number] Date of Birth: August 25, 1945   Medicare Important Message Given:  Yes    Kerin Salen 11/01/2017, 11:11 AM

## 2017-11-01 NOTE — Progress Notes (Signed)
Physical Therapy Treatment Patient Details Name: DELMAS FAUCETT MRN: [id number] DOB: 1945-09-05 Today's Date: 11/01/2017    History of Present Illness 72 yo male admitted with fall, weakness, urinary retention. Hx of Parkinson's disease, falls.     PT Comments    Pt is continuing to progress with mobility. He is set to d/c to SNF on today.    Follow Up Recommendations  SNF     Equipment Recommendations  None recommended by PT    Recommendations for Other Services       Precautions / Restrictions Precautions Precautions: Fall Restrictions Weight Bearing Restrictions: No    Mobility  Bed Mobility               General bed mobility comments: oob in bathroom  Transfers Overall transfer level: Needs assistance Equipment used: Rolling walker (2 wheeled) Transfers: Sit to/from Stand Sit to Stand: Min guard         General transfer comment: close guard for safety. VCs safety, hand placement  Ambulation/Gait Ambulation/Gait assistance: Min guard Ambulation Distance (Feet): 100 Feet Assistive device: Rolling walker (2 wheeled) Gait Pattern/deviations: Step-through pattern;Decreased stride length;Shuffle;Decreased step length - left;Decreased step length - right     General Gait Details: skilled cues for increased step length and weighshifting. close guard for safety. pt tolerated increased distance well.    Stairs             Wheelchair Mobility    Modified Rankin (Stroke Patients Only)       Balance Overall balance assessment: Needs assistance         Standing balance support: Bilateral upper extremity supported Standing balance-Leahy Scale: Poor                              Cognition Arousal/Alertness: Awake/alert Behavior During Therapy: WFL for tasks assessed/performed Overall Cognitive Status: Within Functional Limits for tasks assessed                                        Exercises       General Comments        Pertinent Vitals/Pain Pain Assessment: No/denies pain    Home Living                      Prior Function            PT Goals (current goals can now be found in the care plan section) Progress towards PT goals: Progressing toward goals    Frequency    Min 2X/week      PT Plan Current plan remains appropriate    Co-evaluation              AM-PAC PT "6 Clicks" Daily Activity  Outcome Measure  Difficulty turning over in bed (including adjusting bedclothes, sheets and blankets)?: A Lot Difficulty moving from lying on back to sitting on the side of the bed? : Unable Difficulty sitting down on and standing up from a chair with arms (e.g., wheelchair, bedside commode, etc,.)?: Unable Help needed moving to and from a bed to chair (including a wheelchair)?: A Little Help needed walking in hospital room?: A Little Help needed climbing 3-5 steps with a railing? : A Lot 6 Click Score: 12    End of Session Equipment Utilized During Treatment: Gait belt Activity Tolerance:  Patient tolerated treatment well Patient left: with call bell/phone within reach;with family/visitor present(in bathroom. Instructed pt to pull call bell. Ex wife in room as well. )   PT Visit Diagnosis: Difficulty in walking, not elsewhere classified (R26.2);History of falling (Z91.81);Muscle weakness (generalized) (M62.81);Other symptoms and signs involving the nervous system (R29.898)     Time: 0981-1914 PT Time Calculation (min) (ACUTE ONLY): 12 min  Charges:  $Gait Training: 8-22 mins                    G Codes:          Weston Anna, MPT Pager: [phone]

## 2017-11-01 NOTE — Clinical Social Work Placement (Signed)
Pt discharged with plan to admit to [facility] room #1008-P report [phone]. Wife Tye [place] agreeable to plan. Will arrange ptar transport DC information provided via the [facility]  NOTE  Date:  11/01/2017  Patient Details  Name: Kyle Ball MRN: [id number] Date of Birth: 24-Dec-1945  Clinical Social Work is seeking post-discharge placement for this patient at the [place] level of care (*CSW will initial, date and re-position this form in  chart as items are completed):  Yes   Patient/family provided with [facility] Work Department's list of facilities offering this level of care within the geographic area requested by the patient (or if unable, by the patient's family).  Yes   Patient/family informed of their freedom to choose among providers that offer the needed level of care, that participate in Medicare, Medicaid or managed care program needed by the patient, have an available bed and are willing to accept the patient.  Yes   Patient/family informed of [facility]'s ownership interest in [facility] and [facility], as well as of the fact that they are under no obligation to receive care at these facilities.  PASRR submitted to EDS on 10/30/17     PASRR number received on 10/30/17     Existing PASRR number confirmed on       FL2 transmitted to all facilities in geographic area requested by pt/family on 10/30/17     FL2 transmitted to all facilities within larger geographic area on       Patient informed that his/her managed care company has contracts with or will negotiate with certain facilities, including the following:        Yes   Patient/family informed of bed offers received.  Patient chooses bed at [facility]     Physician recommends and patient chooses bed at [facility]    Patient to be transferred to [facility] on 11/01/17.  Patient to be transferred to facility by PTAR      Patient family notified on 11/01/17 of transfer.  Name of family member notified:  wife [facility]     PHYSICIAN       Additional Comment:    _______________________________________________ Nila Nephew, LCSW 11/01/2017, 3:43 PM  [phone]

## 2017-11-02 DIAGNOSIS — G20 Parkinson's disease: Secondary | ICD-10-CM | POA: Diagnosis not present

## 2017-11-02 DIAGNOSIS — K5080 Crohn's disease of both small and large intestine without complications: Secondary | ICD-10-CM | POA: Diagnosis not present

## 2017-11-02 DIAGNOSIS — N401 Enlarged prostate with lower urinary tract symptoms: Secondary | ICD-10-CM | POA: Diagnosis not present

## 2017-11-02 DIAGNOSIS — R339 Retention of urine, unspecified: Secondary | ICD-10-CM | POA: Diagnosis not present

## 2017-11-02 NOTE — Progress Notes (Signed)
Pt discharged via [facility] to [facility]. Patient alert and oriented x4, patient's vitals were stable and pt did not complain of pain.  Pt discharged with foley. Patient received discharge teaching on previous shift.

## 2017-11-07 ENCOUNTER — Telehealth: Payer: Self-pay | Admitting: Internal Medicine

## 2017-11-07 NOTE — Telephone Encounter (Signed)
Do you want to wait until OV in July to discuss options or do you want to try an alternative biologic? Cimzia was denied

## 2017-11-07 NOTE — Telephone Encounter (Signed)
We should try Humira  Explain it is similar to Cimzia but given every other week  Also - can try budesonide 3 mg 3 tabs (9mg  total) daily # 90 1 refill while we wait for the Humira also

## 2017-11-08 DIAGNOSIS — R82998 Other abnormal findings in urine: Secondary | ICD-10-CM | POA: Diagnosis not present

## 2017-11-08 DIAGNOSIS — N401 Enlarged prostate with lower urinary tract symptoms: Secondary | ICD-10-CM | POA: Diagnosis not present

## 2017-11-08 DIAGNOSIS — R339 Retention of urine, unspecified: Secondary | ICD-10-CM | POA: Diagnosis not present

## 2017-11-08 MED ORDER — BUDESONIDE 3 MG PO CPEP: 9.0000 mg | ORAL_CAPSULE | Freq: Every day | ORAL | 1 refills | Status: AC

## 2017-11-08 MED ORDER — BUDESONIDE 3 MG PO CPEP: 9.0000 mg | ORAL_CAPSULE | Freq: Every day | ORAL | 1 refills | Status: DC

## 2017-11-08 NOTE — Telephone Encounter (Signed)
Wife notified Patient is currently in rehab at [facility]  New rx for humira sent to [facility].   Rx for budesonide sent as well. He will keep the follow up with Dr. Carlean Purl on 12/18/17

## 2017-11-08 NOTE — Addendum Note (Signed)
Addended by: Marlon Pel on: 11/08/2017 04:44 PM   Modules accepted: Orders

## 2017-11-09 ENCOUNTER — Telehealth: Payer: Self-pay | Admitting: Internal Medicine

## 2017-11-09 NOTE — Telephone Encounter (Signed)
Let us not start Humira now  Hopefully they will treat him with the budesonide  Plan to reassess at [facility] in July

## 2017-11-09 NOTE — Telephone Encounter (Signed)
Patient is in an assisted living facility for rehab.  Ok to begin Humira or should he wait until he is discharged? Did send rx for budesonide  [facility] is going to try and get him a grant.  Approved but very expensive

## 2017-11-12 ENCOUNTER — Encounter: Payer: Self-pay | Admitting: Physical Therapy

## 2017-11-12 DIAGNOSIS — R338 Other retention of urine: Secondary | ICD-10-CM | POA: Diagnosis not present

## 2017-11-12 NOTE — Therapy (Signed)
[facility] [address] [place], [place], [address] Phone: [phone]   Fax:  [phone]  Patient Details  Name: Kyle Ball MRN: [id number] Date of Birth: [birth year] Referring Provider:  No ref. provider found  Encounter Date: 11/12/2017   PHYSICAL THERAPY DISCHARGE SUMMARY  Visits from Start of Care: 21  Current functional level related to goals / functional outcomes:   PT Long Term Goals - 09/17/17 1315      PT LONG TERM GOAL #1   Title  Pt will be independent with Parkinson's specific HEP for improved transfers, gait and balance.  UPDATED TARGET 10/12/17    Time  4 per recert 0/6/30    Period  Weeks    Status  On-going    Target Date  10/12/17      PT LONG TERM GOAL #2   Title  Pt will improve 5x sit<>stand to less than or equal to 13 seconds  for improved transfer efficiency and safety.    Baseline  15.94 sec 09/17/17    Time  4 per recert 06/18/99    Period  Weeks    Status  On-going    Target Date  10/12/17      PT LONG TERM GOAL #3   Title  Pt will improve TUG score to less than or equal to 13.5 seconds for decreased fall risk.    Baseline  10.6    Time  4    Period  Weeks    Status  Achieved      PT LONG TERM GOAL #4   Title  Pt will improve MiniBESTest score to at least 24/28 for decreased fall risk.    Time  4 per recert 0/9/32    Period  Weeks    Status  On-going    Target Date  10/12/17      PT LONG TERM GOAL #5   Title  Pt will verbalize tips to reduce freezing episodes with gait and turns.    Time  4 per recert 08/15/55    Period  Weeks    Status  On-going    Target Date  10/12/17      PT LONG TERM GOAL #6   Title  Pt will verbalize understanding of posture/positioning/body mechanics for improved posture/decreased back pain.    Time  4 per recert 08/12/18    Period  Weeks    Status  On-going    Target Date  10/12/17      PT LONG TERM GOAL #7   Title  Pt will verbalize plans for  continued community fitness upon D/C from PT.    Time  4 per recert 07/17/40    Period  Weeks    Status  On-going    Target Date  10/12/17      Pt progressesint towards LTGs, and was several visits shy of completing LSVT BIG therapy plan of care.   Remaining deficits: Posture, bradykinesia, balance, gait    Education / Equipment: Educated extensively in postural exercises, LSVT BIG exercises and functional activities Plan: Patient agrees to discharge.  Patient goals were not met. Patient is being discharged due to a change in medical status.  ?????  Pt cancelled several appointments due to medical issues, then was admitted to ED (10/27/17), hospital, then to SNF for continued rehab.       MARRIOTT,AMY W. 11/12/2017, 8:40 AM  [facility] [address] [place], [place], [address]  Phone: [phone]   Fax:  [phone]  Mady Haagensen, [place] 11/12/17 8:44 AM Phone: [phone] Fax: [phone]

## 2017-11-16 DIAGNOSIS — N401 Enlarged prostate with lower urinary tract symptoms: Secondary | ICD-10-CM | POA: Diagnosis not present

## 2017-11-16 DIAGNOSIS — R339 Retention of urine, unspecified: Secondary | ICD-10-CM | POA: Diagnosis not present

## 2017-12-03 DIAGNOSIS — N401 Enlarged prostate with lower urinary tract symptoms: Secondary | ICD-10-CM | POA: Diagnosis not present

## 2017-12-03 DIAGNOSIS — E785 Hyperlipidemia, unspecified: Secondary | ICD-10-CM | POA: Diagnosis not present

## 2017-12-03 DIAGNOSIS — R319 Hematuria, unspecified: Secondary | ICD-10-CM | POA: Diagnosis not present

## 2017-12-03 DIAGNOSIS — R339 Retention of urine, unspecified: Secondary | ICD-10-CM | POA: Diagnosis not present

## 2017-12-04 DIAGNOSIS — R338 Other retention of urine: Secondary | ICD-10-CM | POA: Diagnosis not present

## 2017-12-18 ENCOUNTER — Telehealth: Payer: Self-pay | Admitting: Internal Medicine

## 2017-12-18 ENCOUNTER — Ambulatory Visit: Payer: Medicare Other | Admitting: Internal Medicine

## 2017-12-18 NOTE — Telephone Encounter (Signed)
No charge. 

## 2017-12-24 DIAGNOSIS — E785 Hyperlipidemia, unspecified: Secondary | ICD-10-CM | POA: Diagnosis not present

## 2017-12-24 DIAGNOSIS — K5080 Crohn's disease of both small and large intestine without complications: Secondary | ICD-10-CM | POA: Diagnosis not present

## 2017-12-24 DIAGNOSIS — G20 Parkinson's disease: Secondary | ICD-10-CM | POA: Diagnosis not present

## 2017-12-24 DIAGNOSIS — R339 Retention of urine, unspecified: Secondary | ICD-10-CM | POA: Diagnosis not present

## 2017-12-27 ENCOUNTER — Telehealth: Payer: Self-pay

## 2017-12-27 NOTE — Telephone Encounter (Signed)
Copied from [place] [phone]. Topic: General - Other >> Dec 27, 2017  3:24 PM Judyann Munson wrote: Reason for CRM:  Lenna Sciara- [facility] is requesting new verbal orders to start on 12-28-17  Melissa best contact number is:   [phone]

## 2017-12-27 NOTE — Telephone Encounter (Signed)
Please advise if ok to give verbal orders

## 2017-12-28 DIAGNOSIS — Z87440 Personal history of urinary (tract) infections: Secondary | ICD-10-CM | POA: Diagnosis not present

## 2017-12-28 DIAGNOSIS — K50012 Crohn's disease of small intestine with intestinal obstruction: Secondary | ICD-10-CM | POA: Diagnosis not present

## 2017-12-28 DIAGNOSIS — E785 Hyperlipidemia, unspecified: Secondary | ICD-10-CM | POA: Diagnosis not present

## 2017-12-28 DIAGNOSIS — F329 Major depressive disorder, single episode, unspecified: Secondary | ICD-10-CM | POA: Diagnosis not present

## 2017-12-28 DIAGNOSIS — G20 Parkinson's disease: Secondary | ICD-10-CM | POA: Diagnosis not present

## 2017-12-28 DIAGNOSIS — N401 Enlarged prostate with lower urinary tract symptoms: Secondary | ICD-10-CM | POA: Diagnosis not present

## 2017-12-31 ENCOUNTER — Telehealth: Payer: Self-pay | Admitting: Family Medicine

## 2017-12-31 DIAGNOSIS — N401 Enlarged prostate with lower urinary tract symptoms: Secondary | ICD-10-CM | POA: Diagnosis not present

## 2017-12-31 DIAGNOSIS — E785 Hyperlipidemia, unspecified: Secondary | ICD-10-CM | POA: Diagnosis not present

## 2017-12-31 DIAGNOSIS — G20 Parkinson's disease: Secondary | ICD-10-CM | POA: Diagnosis not present

## 2017-12-31 DIAGNOSIS — F329 Major depressive disorder, single episode, unspecified: Secondary | ICD-10-CM | POA: Diagnosis not present

## 2017-12-31 DIAGNOSIS — K50012 Crohn's disease of small intestine with intestinal obstruction: Secondary | ICD-10-CM | POA: Diagnosis not present

## 2017-12-31 DIAGNOSIS — Z87440 Personal history of urinary (tract) infections: Secondary | ICD-10-CM | POA: Diagnosis not present

## 2017-12-31 NOTE — Telephone Encounter (Signed)
Copied from [place] [phone]. Topic: General - Other >> Dec 31, 2017 12:26 PM Yvette Rack wrote: Reason for CRM: OT Costella Hatcher [facility]  [phone] calling to get verbal orders OT for twice a weeks for 8 weeks

## 2017-12-31 NOTE — Telephone Encounter (Signed)
ok 

## 2018-01-01 DIAGNOSIS — K50012 Crohn's disease of small intestine with intestinal obstruction: Secondary | ICD-10-CM | POA: Diagnosis not present

## 2018-01-01 DIAGNOSIS — N138 Other obstructive and reflux uropathy: Secondary | ICD-10-CM | POA: Diagnosis not present

## 2018-01-01 DIAGNOSIS — E785 Hyperlipidemia, unspecified: Secondary | ICD-10-CM | POA: Diagnosis not present

## 2018-01-01 DIAGNOSIS — F329 Major depressive disorder, single episode, unspecified: Secondary | ICD-10-CM | POA: Diagnosis not present

## 2018-01-01 DIAGNOSIS — Z87440 Personal history of urinary (tract) infections: Secondary | ICD-10-CM | POA: Diagnosis not present

## 2018-01-01 DIAGNOSIS — N401 Enlarged prostate with lower urinary tract symptoms: Secondary | ICD-10-CM | POA: Diagnosis not present

## 2018-01-01 DIAGNOSIS — G20 Parkinson's disease: Secondary | ICD-10-CM | POA: Diagnosis not present

## 2018-01-01 NOTE — Telephone Encounter (Signed)
Called and spoke with [facility] for verba orders have been given.

## 2018-01-01 NOTE — Telephone Encounter (Signed)
ok 

## 2018-01-01 NOTE — Telephone Encounter (Signed)
Sent to PCP for the OK for verbal orders  

## 2018-01-02 ENCOUNTER — Other Ambulatory Visit: Payer: Self-pay | Admitting: Urology

## 2018-01-02 DIAGNOSIS — K50012 Crohn's disease of small intestine with intestinal obstruction: Secondary | ICD-10-CM | POA: Diagnosis not present

## 2018-01-02 DIAGNOSIS — N401 Enlarged prostate with lower urinary tract symptoms: Secondary | ICD-10-CM | POA: Diagnosis not present

## 2018-01-02 DIAGNOSIS — G20 Parkinson's disease: Secondary | ICD-10-CM | POA: Diagnosis not present

## 2018-01-02 DIAGNOSIS — E785 Hyperlipidemia, unspecified: Secondary | ICD-10-CM | POA: Diagnosis not present

## 2018-01-02 DIAGNOSIS — F329 Major depressive disorder, single episode, unspecified: Secondary | ICD-10-CM | POA: Diagnosis not present

## 2018-01-02 DIAGNOSIS — Z87440 Personal history of urinary (tract) infections: Secondary | ICD-10-CM | POA: Diagnosis not present

## 2018-01-02 NOTE — Telephone Encounter (Signed)
Spoke with Costella Hatcher from [facility], gave verbal orders for OT on pt per Dr Volanda Napoleon

## 2018-01-03 DIAGNOSIS — N401 Enlarged prostate with lower urinary tract symptoms: Secondary | ICD-10-CM | POA: Diagnosis not present

## 2018-01-03 DIAGNOSIS — Z87440 Personal history of urinary (tract) infections: Secondary | ICD-10-CM | POA: Diagnosis not present

## 2018-01-03 DIAGNOSIS — K50012 Crohn's disease of small intestine with intestinal obstruction: Secondary | ICD-10-CM | POA: Diagnosis not present

## 2018-01-03 DIAGNOSIS — F329 Major depressive disorder, single episode, unspecified: Secondary | ICD-10-CM | POA: Diagnosis not present

## 2018-01-03 DIAGNOSIS — G20 Parkinson's disease: Secondary | ICD-10-CM | POA: Diagnosis not present

## 2018-01-03 DIAGNOSIS — E785 Hyperlipidemia, unspecified: Secondary | ICD-10-CM | POA: Diagnosis not present

## 2018-01-04 DIAGNOSIS — N401 Enlarged prostate with lower urinary tract symptoms: Secondary | ICD-10-CM | POA: Diagnosis not present

## 2018-01-04 DIAGNOSIS — F329 Major depressive disorder, single episode, unspecified: Secondary | ICD-10-CM | POA: Diagnosis not present

## 2018-01-04 DIAGNOSIS — K50012 Crohn's disease of small intestine with intestinal obstruction: Secondary | ICD-10-CM | POA: Diagnosis not present

## 2018-01-04 DIAGNOSIS — E785 Hyperlipidemia, unspecified: Secondary | ICD-10-CM | POA: Diagnosis not present

## 2018-01-04 DIAGNOSIS — Z87440 Personal history of urinary (tract) infections: Secondary | ICD-10-CM | POA: Diagnosis not present

## 2018-01-04 DIAGNOSIS — G20 Parkinson's disease: Secondary | ICD-10-CM | POA: Diagnosis not present

## 2018-01-07 DIAGNOSIS — E785 Hyperlipidemia, unspecified: Secondary | ICD-10-CM | POA: Diagnosis not present

## 2018-01-07 DIAGNOSIS — K50012 Crohn's disease of small intestine with intestinal obstruction: Secondary | ICD-10-CM | POA: Diagnosis not present

## 2018-01-07 DIAGNOSIS — G20 Parkinson's disease: Secondary | ICD-10-CM | POA: Diagnosis not present

## 2018-01-07 DIAGNOSIS — N401 Enlarged prostate with lower urinary tract symptoms: Secondary | ICD-10-CM | POA: Diagnosis not present

## 2018-01-07 DIAGNOSIS — F329 Major depressive disorder, single episode, unspecified: Secondary | ICD-10-CM | POA: Diagnosis not present

## 2018-01-07 DIAGNOSIS — Z87440 Personal history of urinary (tract) infections: Secondary | ICD-10-CM | POA: Diagnosis not present

## 2018-01-09 DIAGNOSIS — Z87440 Personal history of urinary (tract) infections: Secondary | ICD-10-CM | POA: Diagnosis not present

## 2018-01-09 DIAGNOSIS — E785 Hyperlipidemia, unspecified: Secondary | ICD-10-CM | POA: Diagnosis not present

## 2018-01-09 DIAGNOSIS — N401 Enlarged prostate with lower urinary tract symptoms: Secondary | ICD-10-CM | POA: Diagnosis not present

## 2018-01-09 DIAGNOSIS — F329 Major depressive disorder, single episode, unspecified: Secondary | ICD-10-CM | POA: Diagnosis not present

## 2018-01-09 DIAGNOSIS — G20 Parkinson's disease: Secondary | ICD-10-CM | POA: Diagnosis not present

## 2018-01-09 DIAGNOSIS — K50012 Crohn's disease of small intestine with intestinal obstruction: Secondary | ICD-10-CM | POA: Diagnosis not present

## 2018-01-11 DIAGNOSIS — G20 Parkinson's disease: Secondary | ICD-10-CM | POA: Diagnosis not present

## 2018-01-11 DIAGNOSIS — Z87440 Personal history of urinary (tract) infections: Secondary | ICD-10-CM | POA: Diagnosis not present

## 2018-01-11 DIAGNOSIS — K50012 Crohn's disease of small intestine with intestinal obstruction: Secondary | ICD-10-CM | POA: Diagnosis not present

## 2018-01-11 DIAGNOSIS — E785 Hyperlipidemia, unspecified: Secondary | ICD-10-CM | POA: Diagnosis not present

## 2018-01-11 DIAGNOSIS — N401 Enlarged prostate with lower urinary tract symptoms: Secondary | ICD-10-CM | POA: Diagnosis not present

## 2018-01-11 DIAGNOSIS — F329 Major depressive disorder, single episode, unspecified: Secondary | ICD-10-CM | POA: Diagnosis not present

## 2018-01-14 DIAGNOSIS — F329 Major depressive disorder, single episode, unspecified: Secondary | ICD-10-CM | POA: Diagnosis not present

## 2018-01-14 DIAGNOSIS — K50012 Crohn's disease of small intestine with intestinal obstruction: Secondary | ICD-10-CM | POA: Diagnosis not present

## 2018-01-14 DIAGNOSIS — Z87440 Personal history of urinary (tract) infections: Secondary | ICD-10-CM | POA: Diagnosis not present

## 2018-01-14 DIAGNOSIS — G20 Parkinson's disease: Secondary | ICD-10-CM | POA: Diagnosis not present

## 2018-01-14 DIAGNOSIS — N401 Enlarged prostate with lower urinary tract symptoms: Secondary | ICD-10-CM | POA: Diagnosis not present

## 2018-01-14 DIAGNOSIS — E785 Hyperlipidemia, unspecified: Secondary | ICD-10-CM | POA: Diagnosis not present

## 2018-01-15 ENCOUNTER — Encounter (HOSPITAL_COMMUNITY): Payer: Self-pay | Admitting: *Deleted

## 2018-01-15 DIAGNOSIS — Z87440 Personal history of urinary (tract) infections: Secondary | ICD-10-CM | POA: Diagnosis not present

## 2018-01-15 DIAGNOSIS — G20 Parkinson's disease: Secondary | ICD-10-CM | POA: Diagnosis not present

## 2018-01-15 DIAGNOSIS — F329 Major depressive disorder, single episode, unspecified: Secondary | ICD-10-CM | POA: Diagnosis not present

## 2018-01-15 DIAGNOSIS — K50012 Crohn's disease of small intestine with intestinal obstruction: Secondary | ICD-10-CM | POA: Diagnosis not present

## 2018-01-15 DIAGNOSIS — E785 Hyperlipidemia, unspecified: Secondary | ICD-10-CM | POA: Diagnosis not present

## 2018-01-15 DIAGNOSIS — N401 Enlarged prostate with lower urinary tract symptoms: Secondary | ICD-10-CM | POA: Diagnosis not present

## 2018-01-15 NOTE — Progress Notes (Signed)
Preop instructions for:  Kody " [name]' Glennon Mac                        Date of Birth: 1945/08/09                            Date of Procedure:01/23/2018        Doctor:Dr Louis Meckel  Time to arrive at [facility]:  1215pm  Report to: Admitting  Procedure: Transurethral Resection of Prostate  Any procedure time changes, MD office will notify you!   Do not eat  past midnight the night before your procedure.(To include any tube feedings-must be discontinued)  May have clear liquids from 12 midnite until 0900am morning of surgery then nothing by mouth.    CLEAR LIQUID DIET   Foods Allowed                                                                     Foods Excluded  Coffee and tea, regular and decaf                             liquids that you cannot  Plain Jell-O in any flavor                                             see through such as: Fruit ices (not with fruit pulp)                                     milk, soups, orange juice  Iced Popsicles                                    All solid food Carbonated beverages, regular and diet                                    Cranberry, grape and apple juices Sports drinks like Gatorade Lightly seasoned clear broth or consume(fat free) Sugar, honey syrup  Sample Menu Breakfast                                Lunch                                     Supper Cranberry juice                    Beef broth                            Chicken broth Jell-O  Grape juice                           Apple juice Coffee or tea                        Jell-O                                      Popsicle                                                Coffee or tea                        Coffee or tea  _____________________________________________________________________     Take these morning medications only with sips of water.(or give through gastrostomy or feeding tube). Avodart,  Budesonide, citalopram, Carbidopa-Levodopa       Facility contact: [employer]                   Phone: [place]:  Transportation contact phone#: [facility]   Please send day of procedure:current med list and meds last taken that day, confirm nothing by mouth status from what time, Patient Demographic info( to include DNR status, problem list, allergies)   RN contact name/phone#: [facility]                             and Fax #: [phone] [place] card and picture ID Leave all jewelry and other valuables at place where living( no metal or rings to be worn) No contact lens  Men-no colognes,lotions  Any questions day of procedure,call [facility] [phone]    Sent from :[facility]                   [place]                   Fax:[phone]  Sent by :Theodis Shove RN

## 2018-01-16 DIAGNOSIS — G20 Parkinson's disease: Secondary | ICD-10-CM | POA: Diagnosis not present

## 2018-01-16 DIAGNOSIS — K50012 Crohn's disease of small intestine with intestinal obstruction: Secondary | ICD-10-CM | POA: Diagnosis not present

## 2018-01-16 DIAGNOSIS — N401 Enlarged prostate with lower urinary tract symptoms: Secondary | ICD-10-CM | POA: Diagnosis not present

## 2018-01-16 DIAGNOSIS — F329 Major depressive disorder, single episode, unspecified: Secondary | ICD-10-CM | POA: Diagnosis not present

## 2018-01-16 DIAGNOSIS — E785 Hyperlipidemia, unspecified: Secondary | ICD-10-CM | POA: Diagnosis not present

## 2018-01-16 DIAGNOSIS — Z87440 Personal history of urinary (tract) infections: Secondary | ICD-10-CM | POA: Diagnosis not present

## 2018-01-17 DIAGNOSIS — N401 Enlarged prostate with lower urinary tract symptoms: Secondary | ICD-10-CM | POA: Diagnosis not present

## 2018-01-17 DIAGNOSIS — G20 Parkinson's disease: Secondary | ICD-10-CM | POA: Diagnosis not present

## 2018-01-17 DIAGNOSIS — Z87440 Personal history of urinary (tract) infections: Secondary | ICD-10-CM | POA: Diagnosis not present

## 2018-01-17 DIAGNOSIS — K50012 Crohn's disease of small intestine with intestinal obstruction: Secondary | ICD-10-CM | POA: Diagnosis not present

## 2018-01-17 DIAGNOSIS — E785 Hyperlipidemia, unspecified: Secondary | ICD-10-CM | POA: Diagnosis not present

## 2018-01-17 DIAGNOSIS — F329 Major depressive disorder, single episode, unspecified: Secondary | ICD-10-CM | POA: Diagnosis not present

## 2018-01-21 ENCOUNTER — Other Ambulatory Visit: Payer: Self-pay

## 2018-01-21 DIAGNOSIS — G20 Parkinson's disease: Secondary | ICD-10-CM | POA: Diagnosis not present

## 2018-01-21 DIAGNOSIS — K50012 Crohn's disease of small intestine with intestinal obstruction: Secondary | ICD-10-CM | POA: Diagnosis not present

## 2018-01-21 DIAGNOSIS — E785 Hyperlipidemia, unspecified: Secondary | ICD-10-CM | POA: Diagnosis not present

## 2018-01-21 DIAGNOSIS — Z87440 Personal history of urinary (tract) infections: Secondary | ICD-10-CM | POA: Diagnosis not present

## 2018-01-21 DIAGNOSIS — F329 Major depressive disorder, single episode, unspecified: Secondary | ICD-10-CM | POA: Diagnosis not present

## 2018-01-21 DIAGNOSIS — N401 Enlarged prostate with lower urinary tract symptoms: Secondary | ICD-10-CM | POA: Diagnosis not present

## 2018-01-21 NOTE — Progress Notes (Signed)
Spoke with nurse at [facility] and they have received preop instruction sheet.  Spoke with wife - Andres Vest and she will be bringing patient day of procedure.  Patient is able to sign own consent form.  Wife is aware patient is to asrrive at 1215pm with surgery time of 315pm.  Wife is aware [facility] has preop instructions.

## 2018-01-22 DIAGNOSIS — E785 Hyperlipidemia, unspecified: Secondary | ICD-10-CM | POA: Diagnosis not present

## 2018-01-22 DIAGNOSIS — N401 Enlarged prostate with lower urinary tract symptoms: Secondary | ICD-10-CM | POA: Diagnosis not present

## 2018-01-22 DIAGNOSIS — F329 Major depressive disorder, single episode, unspecified: Secondary | ICD-10-CM | POA: Diagnosis not present

## 2018-01-22 DIAGNOSIS — K50012 Crohn's disease of small intestine with intestinal obstruction: Secondary | ICD-10-CM | POA: Diagnosis not present

## 2018-01-22 DIAGNOSIS — Z87440 Personal history of urinary (tract) infections: Secondary | ICD-10-CM | POA: Diagnosis not present

## 2018-01-22 DIAGNOSIS — G20 Parkinson's disease: Secondary | ICD-10-CM | POA: Diagnosis not present

## 2018-01-23 ENCOUNTER — Encounter (HOSPITAL_COMMUNITY): Payer: Self-pay | Admitting: General Practice

## 2018-01-23 ENCOUNTER — Ambulatory Visit (HOSPITAL_COMMUNITY): Payer: Medicare Other | Admitting: Anesthesiology

## 2018-01-23 ENCOUNTER — Other Ambulatory Visit: Payer: Self-pay

## 2018-01-23 ENCOUNTER — Encounter (HOSPITAL_COMMUNITY)
Admission: RE | Disposition: A | Discharge status: Home / Self Care | Payer: Self-pay | Source: Ambulatory Visit | Attending: Urology

## 2018-01-23 ENCOUNTER — Observation Stay (HOSPITAL_COMMUNITY)
Admission: RE | Admit: 2018-01-23 | Discharge: 2018-01-24 | Disposition: A | Discharge status: Home / Self Care | Payer: Medicare Other | Source: Ambulatory Visit | Attending: Urology | Admitting: Urology

## 2018-01-23 DIAGNOSIS — Z8043 Family history of malignant neoplasm of testis: Secondary | ICD-10-CM | POA: Diagnosis not present

## 2018-01-23 DIAGNOSIS — Z7982 Long term (current) use of aspirin: Secondary | ICD-10-CM | POA: Diagnosis not present

## 2018-01-23 DIAGNOSIS — Z880 Allergy status to penicillin: Secondary | ICD-10-CM | POA: Diagnosis not present

## 2018-01-23 DIAGNOSIS — Z6828 Body mass index (BMI) 28.0-28.9, adult: Secondary | ICD-10-CM | POA: Insufficient documentation

## 2018-01-23 DIAGNOSIS — R32 Unspecified urinary incontinence: Secondary | ICD-10-CM | POA: Insufficient documentation

## 2018-01-23 DIAGNOSIS — N138 Other obstructive and reflux uropathy: Secondary | ICD-10-CM | POA: Diagnosis not present

## 2018-01-23 DIAGNOSIS — E785 Hyperlipidemia, unspecified: Secondary | ICD-10-CM | POA: Insufficient documentation

## 2018-01-23 DIAGNOSIS — E669 Obesity, unspecified: Secondary | ICD-10-CM | POA: Insufficient documentation

## 2018-01-23 DIAGNOSIS — Z8249 Family history of ischemic heart disease and other diseases of the circulatory system: Secondary | ICD-10-CM | POA: Diagnosis not present

## 2018-01-23 DIAGNOSIS — F419 Anxiety disorder, unspecified: Secondary | ICD-10-CM | POA: Insufficient documentation

## 2018-01-23 DIAGNOSIS — N401 Enlarged prostate with lower urinary tract symptoms: Principal | ICD-10-CM | POA: Insufficient documentation

## 2018-01-23 DIAGNOSIS — G20 Parkinson's disease: Secondary | ICD-10-CM | POA: Insufficient documentation

## 2018-01-23 DIAGNOSIS — Z79899 Other long term (current) drug therapy: Secondary | ICD-10-CM | POA: Diagnosis not present

## 2018-01-23 DIAGNOSIS — R339 Retention of urine, unspecified: Secondary | ICD-10-CM | POA: Diagnosis not present

## 2018-01-23 DIAGNOSIS — N183 Chronic kidney disease, stage 3 (moderate): Secondary | ICD-10-CM | POA: Insufficient documentation

## 2018-01-23 DIAGNOSIS — R338 Other retention of urine: Secondary | ICD-10-CM | POA: Diagnosis not present

## 2018-01-23 DIAGNOSIS — N529 Male erectile dysfunction, unspecified: Secondary | ICD-10-CM | POA: Diagnosis not present

## 2018-01-23 DIAGNOSIS — K5090 Crohn's disease, unspecified, without complications: Secondary | ICD-10-CM | POA: Insufficient documentation

## 2018-01-23 DIAGNOSIS — N320 Bladder-neck obstruction: Secondary | ICD-10-CM | POA: Insufficient documentation

## 2018-01-23 HISTORY — DX: Repeated falls: R29.6

## 2018-01-23 HISTORY — PX: TRANSURETHRAL RESECTION OF PROSTATE: SHX73

## 2018-01-23 HISTORY — DX: Other abnormalities of gait and mobility: R26.89

## 2018-01-23 HISTORY — DX: Hematuria, unspecified: R31.9

## 2018-01-23 LAB — BASIC METABOLIC PANEL
Anion gap: 8 (ref 5–15)
Anion gap: 13 (ref 5–15)
BUN: 16 mg/dL (ref 8–23)
BUN: 14 mg/dL (ref 8–23)
CHLORIDE: 106 mmol/L (ref 98–111)
CHLORIDE: 103 mmol/L (ref 98–111)
CO2: 28 mmol/L (ref 22–32)
CO2: 26 mmol/L (ref 22–32)
CREATININE: 0.7 mg/dL (ref 0.61–1.24)
Calcium: 8.9 mg/dL (ref 8.9–10.3)
Calcium: 8.8 mg/dL — ABNORMAL LOW (ref 8.9–10.3)
Creatinine, Ser: 0.79 mg/dL (ref 0.61–1.24)
GFR calc Af Amer: >60 mL/min (ref >60)
GFR calc non Af Amer: >60 mL/min (ref >60)
GFR calc non Af Amer: >60 mL/min (ref >60)
Glucose, Bld: 87 mg/dL (ref 70–99)
Glucose, Bld: 124 mg/dL — ABNORMAL HIGH (ref 70–99)
POTASSIUM: 3.6 mmol/L (ref 3.5–5.1)
Potassium: 3.7 mmol/L (ref 3.5–5.1)
SODIUM: 142 mmol/L (ref 135–145)
Sodium: 142 mmol/L (ref 135–145)

## 2018-01-23 LAB — CBC
HCT: 42.3 % (ref 39.0–52.0)
HEMATOCRIT: 42.3 % (ref 39.0–52.0)
HEMOGLOBIN: 13.7 g/dL (ref 13.0–17.0)
Hemoglobin: 13.9 g/dL (ref 13.0–17.0)
MCH: 28.3 pg (ref 26.0–34.0)
MCH: 28.1 pg (ref 26.0–34.0)
MCHC: 32.9 g/dL (ref 30.0–36.0)
MCHC: 32.4 g/dL (ref 30.0–36.0)
MCV: 86 fL (ref 78.0–100.0)
MCV: 86.7 fL (ref 78.0–100.0)
PLATELETS: 198 10*3/uL (ref 150–400)
PLATELETS: 181 10*3/uL (ref 150–400)
RBC: 4.92 MIL/uL (ref 4.22–5.81)
RBC: 4.88 MIL/uL (ref 4.22–5.81)
RDW: 14.3 % (ref 11.5–15.5)
RDW: 14 % (ref 11.5–15.5)
WBC: 7.6 10*3/uL (ref 4.0–10.5)
WBC: 7.4 10*3/uL (ref 4.0–10.5)

## 2018-01-23 SURGERY — TURP (TRANSURETHRAL RESECTION OF PROSTATE)
Anesthesia: General

## 2018-01-23 MED ORDER — DEXAMETHASONE SODIUM PHOSPHATE 10 MG/ML IJ SOLN
INTRAMUSCULAR | Status: DC | PRN
  Administered 2018-01-23: 10 mg via INTRAVENOUS

## 2018-01-23 MED ORDER — BELLADONNA ALKALOIDS-OPIUM 16.2-30 MG RE SUPP
RECTAL | Status: AC
  Filled 2018-01-23: qty 1

## 2018-01-23 MED ORDER — SODIUM CHLORIDE 0.9 % IR SOLN
Status: DC | PRN
  Administered 2018-01-23: 33000 mL via INTRAVESICAL

## 2018-01-23 MED ORDER — FENTANYL CITRATE (PF) 100 MCG/2ML IJ SOLN
INTRAMUSCULAR | Status: AC
  Filled 2018-01-23: qty 2

## 2018-01-23 MED ORDER — LACTATED RINGERS IV SOLN
INTRAVENOUS | Status: DC
Start: 2018-01-23 — End: 2018-01-23
  Administered 2018-01-23 (×2): via INTRAVENOUS

## 2018-01-23 MED ORDER — LIDOCAINE 2% (20 MG/ML) 5 ML SYRINGE
INTRAMUSCULAR | Status: AC
  Filled 2018-01-23: qty 5

## 2018-01-23 MED ORDER — METOPROLOL TARTRATE 25 MG PO TABS
25.0000 mg | ORAL_TABLET | Freq: Once | ORAL | Status: AC
  Administered 2018-01-23: 25 mg via ORAL
  Filled 2018-01-23: qty 1

## 2018-01-23 MED ORDER — FENTANYL CITRATE (PF) 100 MCG/2ML IJ SOLN
INTRAMUSCULAR | Status: DC | PRN
  Administered 2018-01-23 (×4): 25 ug via INTRAVENOUS

## 2018-01-23 MED ORDER — DEXTROSE 5 % IV SOLN
5.0000 mg/kg | INTRAVENOUS | Status: AC
  Administered 2018-01-23: 510 mg via INTRAVENOUS
  Filled 2018-01-23: qty 12.75

## 2018-01-23 MED ORDER — LACTATED RINGERS IV SOLN
INTRAVENOUS | Status: DC
  Administered 2018-01-23 – 2018-01-24 (×2): via INTRAVENOUS

## 2018-01-23 MED ORDER — BUDESONIDE 3 MG PO CPEP
9.0000 mg | ORAL_CAPSULE | Freq: Every day | ORAL | Status: DC
  Administered 2018-01-24: 9 mg via ORAL
  Filled 2018-01-23: qty 3

## 2018-01-23 MED ORDER — ONDANSETRON HCL 4 MG/2ML IJ SOLN: 4.0000 mg | Freq: Four times a day (QID) | INTRAMUSCULAR | Status: DC | PRN

## 2018-01-23 MED ORDER — PROMETHAZINE HCL 25 MG/ML IJ SOLN: 6.2500 mg | INTRAMUSCULAR | Status: DC | PRN

## 2018-01-23 MED ORDER — FENTANYL CITRATE (PF) 100 MCG/2ML IJ SOLN: 25.0000 ug | INTRAMUSCULAR | Status: DC | PRN

## 2018-01-23 MED ORDER — ATORVASTATIN CALCIUM 20 MG PO TABS
20.0000 mg | ORAL_TABLET | Freq: Every day | ORAL | Status: DC
  Administered 2018-01-23: 20 mg via ORAL
  Filled 2018-01-23: qty 1

## 2018-01-23 MED ORDER — EPHEDRINE 5 MG/ML INJ
INTRAVENOUS | Status: AC
  Filled 2018-01-23: qty 10

## 2018-01-23 MED ORDER — TRAMADOL HCL 50 MG PO TABS: 50.0000 mg | ORAL_TABLET | Freq: Four times a day (QID) | ORAL | Status: DC | PRN

## 2018-01-23 MED ORDER — POTASSIUM CHLORIDE CRYS ER 20 MEQ PO TBCR
40.0000 meq | EXTENDED_RELEASE_TABLET | Freq: Once | ORAL | Status: AC
  Administered 2018-01-23: 40 meq via ORAL
  Filled 2018-01-23: qty 2

## 2018-01-23 MED ORDER — ONDANSETRON HCL 4 MG/2ML IJ SOLN
INTRAMUSCULAR | Status: DC | PRN
  Administered 2018-01-23: 4 mg via INTRAVENOUS

## 2018-01-23 MED ORDER — SODIUM CHLORIDE 0.9 % IR SOLN
3000.0000 mL | Status: DC
  Administered 2018-01-23 – 2018-01-24 (×2): 3000 mL

## 2018-01-23 MED ORDER — CARBIDOPA-LEVODOPA 25-250 MG PO TABS
1.0000 | ORAL_TABLET | Freq: Three times a day (TID) | ORAL | Status: DC
  Administered 2018-01-23 – 2018-01-24 (×2): 1 via ORAL
  Filled 2018-01-23 (×3): qty 1

## 2018-01-23 MED ORDER — BELLADONNA ALKALOIDS-OPIUM 16.2-60 MG RE SUPP
RECTAL | Status: DC | PRN
  Administered 2018-01-23: 1 via RECTAL

## 2018-01-23 MED ORDER — MEPERIDINE HCL 50 MG/ML IJ SOLN: 6.2500 mg | INTRAMUSCULAR | Status: DC | PRN

## 2018-01-23 MED ORDER — EPHEDRINE SULFATE 50 MG/ML IJ SOLN
INTRAMUSCULAR | Status: DC | PRN
  Administered 2018-01-23 (×2): 5 mg via INTRAVENOUS

## 2018-01-23 MED ORDER — PROPOFOL 10 MG/ML IV BOLUS
INTRAVENOUS | Status: AC
  Filled 2018-01-23: qty 20

## 2018-01-23 MED ORDER — LIDOCAINE 2% (20 MG/ML) 5 ML SYRINGE
INTRAMUSCULAR | Status: DC | PRN
  Administered 2018-01-23: 100 mg via INTRAVENOUS

## 2018-01-23 MED ORDER — ALBUMIN HUMAN 5 % IV SOLN
INTRAVENOUS | Status: AC
  Filled 2018-01-23: qty 250

## 2018-01-23 MED ORDER — ACETAMINOPHEN 500 MG PO TABS: 1000.0000 mg | ORAL_TABLET | Freq: Four times a day (QID) | ORAL | Status: DC | PRN

## 2018-01-23 MED ORDER — PROPOFOL 10 MG/ML IV BOLUS
INTRAVENOUS | Status: DC | PRN
  Administered 2018-01-23: 200 mg via INTRAVENOUS

## 2018-01-23 MED ORDER — CITALOPRAM HYDROBROMIDE 20 MG PO TABS
30.0000 mg | ORAL_TABLET | Freq: Every day | ORAL | Status: DC
  Administered 2018-01-24: 30 mg via ORAL
  Filled 2018-01-23: qty 2

## 2018-01-23 SURGICAL SUPPLY — 20 items
BAG URINE DRAINAGE (UROLOGICAL SUPPLIES) ×3 IMPLANT
BAG URO CATCHER STRL LF (MISCELLANEOUS) ×3 IMPLANT
CATH FOLEY 2WAY SLVR 30CC 24FR (CATHETERS) IMPLANT
CATH FOLEY 3WAY 30CC 22FR (CATHETERS) IMPLANT
CATH FOLEY 3WAY 30CC 24FR (CATHETERS) ×2
CATH URTH STD 24FR FL 3W 2 (CATHETERS) ×1 IMPLANT
ELECT REM PT RETURN 15FT ADLT (MISCELLANEOUS) IMPLANT
GLOVE BIO SURGEON STRL SZ7.5 (GLOVE) ×15 IMPLANT
GOWN STRL REUS W/ TWL XL LVL3 (GOWN DISPOSABLE) ×2 IMPLANT
GOWN STRL REUS W/TWL XL LVL3 (GOWN DISPOSABLE) ×7 IMPLANT
HOLDER FOLEY CATH W/STRAP (MISCELLANEOUS) ×3 IMPLANT
LOOP CUT BIPOLAR 24F LRG (ELECTROSURGICAL) ×3 IMPLANT
MANIFOLD NEPTUNE II (INSTRUMENTS) ×3 IMPLANT
PACK CYSTO (CUSTOM PROCEDURE TRAY) ×3 IMPLANT
SET ASPIRATION TUBING (TUBING) IMPLANT
SYR 30ML LL (SYRINGE) ×3 IMPLANT
SYRINGE IRR TOOMEY STRL 70CC (SYRINGE) ×3 IMPLANT
TUBING CONNECTING 10 (TUBING) ×2 IMPLANT
TUBING CONNECTING 10' (TUBING) ×1
TUBING UROLOGY SET (TUBING) ×3 IMPLANT

## 2018-01-23 NOTE — Discharge Instructions (Signed)
Transurethral Resection of the Prostate (TURP) or Greenlight laser ablation of the Prostate  Care After  Refer to this sheet in the next few weeks. These discharge instructions provide you with general information on caring for yourself after you leave the hospital. Your caregiver may also give you specific instructions. Your treatment has been planned according to the most current medical practices available, but unavoidable complications sometimes occur. If you have any problems or questions after discharge, please call your caregiver.  HOME CARE INSTRUCTIONS   Medications  You may receive medicine for pain management. As your level of discomfort decreases, adjustments in your pain medicines may be made.   Take all medicines as directed.   You may be given a medicine (antibiotic) to kill germs following surgery. Finish all medicines. Let your caregiver know if you have any side effects or problems from the medicine.   If you are on aspirin, it would be best not to restart the aspirin until the blood in the urine clears Hygiene  You can take a shower after surgery.   You should not take a bath while you still have the urethral catheter. Activity  You will be encouraged to get out of bed as much as possible and increase your activity level as tolerated.   Spend the first week in and around your home. For 3 weeks, avoid the following:   Straining.   Running.   Strenuous work.   Walks longer than a few blocks.   Riding for extended periods.   Sexual relations.   Do not lift heavy objects (more than 20 pounds) for at least 1 month. When lifting, use your arms instead of your abdominal muscles.   You will be encouraged to walk as tolerated. Do not exert yourself. Increase your activity level slowly. Remember that it is important to keep moving after an operation of any type. This cuts down on the possibility of developing blood clots.   Your caregiver will tell you when you  can resume driving and light housework. Discuss this at your first office visit after discharge. Diet  No special diet is ordered after a TURP. However, if you are on a special diet for another medical problem, it should be continued.   Normal fluid intake is usually recommended.   Avoid alcohol and caffeinated drinks for 2 weeks. They irritate the bladder. Decaffeinated drinks are okay.   Avoid spicy foods.  Bladder Function  For the first 10 days, empty the bladder whenever you feel a definite desire. Do not try to hold the urine for long periods of time.   Urinating once or twice a night even after you are healed is not uncommon.   You may see some recurrence of blood in the urine after discharge from the hospital. This usually happens within 2 weeks after the procedure.If this occurs, force fluids again as you did in the hospital and reduce your activity.  Bowel Function  You may experience some constipation after surgery. This can be minimized by increasing fluids and fiber in your diet. Drink enough water and fluids to keep your urine clear or pale yellow.   A stool softener may be prescribed for use at home. Do not strain to move your bowels.   If you are requiring increased pain medicine, it is important that you take stool softeners to prevent constipation. This will help to promote proper healing by reducing the need to strain to move your bowels.  Sexual Activity  Semen movement  in the opposite direction and into the bladder (retrograde ejaculation) may occur. Since the semen passes into the bladder, cloudy urine can occur the first time you urinate after intercourse. Or, you may not have an ejaculation during erection. Ask your caregiver when you can resume sexual activity. Retrograde ejaculation and reduced semen discharge should not reduce one's pleasure of intercourse.  Postoperative Visit  Arrange the date and time of your after surgery visit with your caregiver.  Return  to Work  After your recovery is complete, you will be able to return to work and resume all activities. Your caregiver will inform you when you can return to work.    Foley Catheter Care A soft, flexible tube (Foley catheter) may have been placed in your bladder to drain urine and fluid. Follow these instructions: Taking Care of the Catheter  Keep the area where the catheter leaves your body clean.   Attach the catheter to the leg so there is no tension on the catheter.   Keep the drainage bag below the level of the bladder, but keep it OFF the floor.   Do not take long soaking baths. Your caregiver will give instructions about showering.   Wash your hands before touching ANYTHING related to the catheter or bag.   Using mild soap and warm water on a washcloth:   Clean the area closest to the catheter insertion site using a circular motion around the catheter.   Clean the catheter itself by wiping AWAY from the insertion site for several inches down the tube.   NEVER wipe upward as this could sweep bacteria up into the urethra (tube in your body that normally drains the bladder) and cause infection.   Place a small amount of sterile lubricant at the tip of the penis where the catheter is entering.  Taking Care of the Drainage Bags  Two drainage bags may be taken home: a large overnight drainage bag, and a smaller leg bag which fits underneath clothing.   It is okay to wear the overnight bag at any time, but NEVER wear the smaller leg bag at night.   Keep the drainage bag well below the level of your bladder. This prevents backflow of urine into the bladder and allows the urine to drain freely.   Anchor the tubing to your leg to prevent pulling or tension on the catheter. Use tape or a leg strap provided by the hospital.   Empty the drainage bag when it is 1/2 to 3/4 full. Wash your hands before and after touching the bag.   Periodically check the tubing for kinks to make sure  there is no pressure on the tubing which could restrict the flow of urine.  Changing the Drainage Bags  Cleanse both ends of the clean bag with alcohol before changing.   Pinch off the rubber catheter to avoid urine spillage during the disconnection.   Disconnect the dirty bag and connect the clean one.   Empty the dirty bag carefully to avoid a urine spill.   Attach the new bag to the leg with tape or a leg strap.  Cleaning the Drainage Bags  Whenever a drainage bag is disconnected, it must be cleaned quickly so it is ready for the next use.   Wash the bag in warm, soapy water.   Rinse the bag thoroughly with warm water.   Soak the bag for 30 minutes in a solution of white vinegar and water (1 cup vinegar to 1 quart warm  water).   Rinse with warm water.  SEEK MEDICAL CARE IF:   You have chills or night sweats.   You are leaking around your catheter or have problems with your catheter. It is not uncommon to have sporadic leakage around your catheter as a result of bladder spasms. If the leakage stops, there is not much need for concern. If you are uncertain, call your caregiver.   You develop side effects that you think are coming from your medicines.  SEEK IMMEDIATE MEDICAL CARE IF:   You are suddenly unable to urinate. Check to see if there are any kinks in the drainage tubing that may cause this. If you cannot find any kinks, call your caregiver immediately. This is an emergency.   You develop shortness of breath or chest pains.   Bleeding persists or clots develop in your urine.   You have a fever.   You develop pain in your back or over your lower belly (abdomen).   You develop pain or swelling in your legs.   Any problems you are having get worse rather than better.  MAKE SURE YOU:   Understand these instructions.   Will watch your condition.   Will get help right away if you are not doing well or get worse.

## 2018-01-23 NOTE — Transfer of Care (Signed)
Immediate Anesthesia Transfer of Care Note  Patient: Kyle Ball  Procedure(s) Performed: TRANSURETHRAL RESECTION OF THE PROSTATE (TURP) (N/A )  Patient Location: PACU  Anesthesia Type:General  Level of Consciousness: drowsy and patient cooperative  Airway & Oxygen Therapy: Patient Spontanous Breathing and Patient connected to face mask oxygen  Post-op Assessment: Report given to RN and Post -op Vital signs reviewed and stable  Post vital signs: Reviewed and stable  Last Vitals:  Vitals Value Taken Time  BP 121/74 01/23/2018  6:27 PM  Temp    Pulse 74 01/23/2018  6:28 PM  Resp 12 01/23/2018  6:28 PM  SpO2 100 % 01/23/2018  6:28 PM  Vitals shown include unvalidated device data.  Last Pain:  Vitals:   01/23/18 1302  TempSrc:   PainSc: 0-No pain         Complications: No apparent anesthesia complications

## 2018-01-23 NOTE — H&P (Signed)
General eval of LUTs  HPI: Kyle Ball is a 72 year-old male established patient who is here for further evaluation and management of lower urinary tract symptoms.  His symptoms have been present for have been present for years. His symptoms have been worse over the last year. He is currently having trouble urinating. He is having problems with urgency control or incontinence. The patient is having urinary incontinence. He does have frequency. He is urinating more frequently now than usual. He is not having pain. His bowels are moving normally.   He does not have a history of frequent urinary tract infections. The patient does not have a family history of prostate cancer. He has had a PSA done.   The patient has a history of an elevated PSA and has been followed by Dr. Risa Grill for a long time. He has been also treated for his lower urinary tract symptoms with Flomax and then within the last 3 years finasteride was added. The patient has also been diagnosed with Parkinson's disease within the last 5 years.   Interval: The patient follows up today for further discussion of TURP. At his last visit we discussed this included detail. The patient wanted to work on getting stronger and moving back home prior to proceeding with any additional interventions. The patient continues to live at a skilled nursing facility, but is getting stronger. He has fallen several times over the past 4 weeks. He is due for catheter change today. He denies any significant issues with the catheter. He does feel fatigue over the past several days.   Patient states he has not been feeling well. He states he has been dizzy and falling. He states today he feels weak and lethargic. Patient denies fever or chills. He has an indwelling catheter today that seems to be draining well.   The patient has had worsening frequency and urgency within the last 6 months. In May the patient developed urinary retention and severe weakness. He was  treated for urinary tract infection and his weakness persisted. Ultimately he was hospitalized and now at this point is back in his rehabilitation facility gaining strength and working towards discharge back to home. The patient has been unable to void for the last 5-6 weeks. He had urodynamics performed earlier today.   Urodynamics, impression: Urodynamics demonstrated a large bladder capacity with some uninhibited detrusor overactivity. There was voluntary contraction with adequate detrusor function, but the patient was unable to void. Fluoroscopic images demonstrated an elevated bladder neck consistent with an enlarged obstructing prostate.     ALLERGIES: Penicillins    MEDICATIONS: Avodart 0.5 mg capsule TAKE 1 CAPSULE Daily  Adult Aspirin Low Strength 81 MG TBDP Oral  Carbidopa-Levodopa 25 mg-250 mg tablet  Celexa 10 mg tablet Oral  Probiotic Formula  Ropinirole Hcl 4 mg tablet Oral  Sildenafil 20 mg tablet 2-5 tablets as needed as directed  Tamsulosin Hcl 0.4 mg capsule 1 capsule PO BID     GU PSH: Complex cystometrogram, w/ void pressure and urethral pressure profile studies, any technique - 12/04/2017 Complex Uroflow - 12/04/2017 Emg surf Electrd - 12/04/2017 Inject For cystogram - 12/04/2017 Intrabd voidng Press - 12/04/2017      PSH Notes: Colon Surgery   NON-GU PSH: Knee Arthroscopy Neuroeltrd Stim Post Tibial - 10/18/2017, 09/20/2017, 08/30/2017, 08/03/2017, 07/05/2017, 06/14/2017, 05/25/2017, 04/26/2017, 04/05/2017, 03/08/2017, 01/25/2017, 01/04/2017, 12/28/2016, 11/30/2016, 11/16/2016, 11/09/2016, 11/02/2016, 10/26/2016, 10/19/2016, 10/12/2016, 10/03/2016, 09/28/2016, 09/21/2016    GU PMH: BPH w/LUTS - 12/04/2017, Benign prostatic hyperplasia with  urinary obstruction, - 2016 Chronic kidney disease stage 3 (GFR 30-60) (Chronic), Will recheck kidney function in 2 days. Instructed to push po fluids and avoid NSAID and renal toxic drugs. - 10/25/2017 Hydronephrosis (Chronic), Bilateral, Mild bilateral  hydronephrosis secondary to elevated PVR. Maintain foley. - 10/25/2017 Urinary Retention (Worsening, Chronic), He will resume Tamsulosin 1 po BID even though I'm not too optimistic this will be successful . He will not begin Myrbetriq at this time. Foley placed. Will maintain and RTC for UDS and cysto secondary to progressively worsening Parkinson and extermely elevated PVR and may need to begin CIC (not a good candidate for this due to poor hand dexterity) vs chronic foley vs chronic SP tube. Will check stat BUN/creatinine. Culture urine. No ABX unless culture proven UTI. - 10/25/2017 Mixed incontinence (Worsening) - 10/24/2017 Urge incontinence - 08/16/2016 ED due to arterial insufficiency, Erectile dysfunction due to arterial insufficiency - 2016 Elevated PSA, Elevated prostate specific antigen (PSA) - 2016 Weak Urinary Stream, Weak urinary stream - 2016 Hematuria, Unspec, Painless hematuria - 2015    NON-GU PMH: Encounter for general adult medical examination without abnormal findings, Encounter for preventive health examination - 2016 Anxiety, Anxiety - 2014 Crohns Disease, Crohn's Disease - 2014 Personal history of other endocrine, nutritional and metabolic disease, History of hypercholesterolemia - 2014 Personal history of other mental and behavioral disorders, History of depression - 2014 Psychogenic ED, Impotence, psychogenic - 2014    FAMILY HISTORY: Breast Cancer - Grandmother Deceased - Father, Mother Diabetes - Father Family Health Status Number - Grandmother fibromyalgia - Sister Heart Attack - Father Hypertension - Father Mental Illness - Sister stroke - Father testicular cancer - Brother Uterine Cancer - Mother   SOCIAL HISTORY: Marital Status: Married Preferred Language: English; Ethnicity: Not Hispanic Or Latino; Race: White    REVIEW OF SYSTEMS:    GU Review Male:   Patient denies frequent urination, hard to postpone urination, burning/ pain with urination, get up at  night to urinate, leakage of urine, stream starts and stops, trouble starting your stream, have to strain to urinate , erection problems, and penile pain.  Gastrointestinal (Upper):   Patient denies nausea, vomiting, and indigestion/ heartburn.  Gastrointestinal (Lower):   Patient reports diarrhea and constipation.   Constitutional:   Patient reports fatigue. Patient denies fever, night sweats, and weight loss.  Skin:   Patient denies skin rash/ lesion and itching.  Eyes:   Patient denies blurred vision and double vision.  Ears/ Nose/ Throat:   Patient denies sore throat and sinus problems.  Hematologic/Lymphatic:   Patient denies swollen glands and easy bruising.  Cardiovascular:   Patient denies leg swelling and chest pains.  Respiratory:   Patient denies cough and shortness of breath.  Endocrine:   Patient denies excessive thirst.  Musculoskeletal:   Patient denies back pain and joint pain.  Neurological:   Patient reports dizziness. Patient denies headaches.  Psychologic:   Patient denies depression and anxiety.   Notes: Patient has crohns disease    VITAL SIGNS:      01/01/2018 11:00 AM  Weight 225 lb / 102.06 kg  Height 74 in / 187.96 cm  BP 99/63 mmHg  Pulse 73 /min  Temperature 98.2 F / 36.7 C  BMI 28.9 kg/m   MULTI-SYSTEM PHYSICAL EXAMINATION:    Respiratory: No labored breathing, no use of accessory muscles.   Cardiovascular: Normal temperature, normal extremity pulses, no swelling, no varicosities.  Gastrointestinal: No mass, no tenderness, no rigidity, non obese abdomen.  Musculoskeletal: Normal gait and station of head and neck.     PAST DATA REVIEWED:  Source Of History:  Patient  Records Review:   Previous Patient Records, POC Tool  Urine Test Review:   Urinalysis  Urodynamics Review:   Review Urodynamics Tests   09/13/16 03/18/15 08/26/14 12/10/13 04/29/13 04/26/12 04/28/11 03/25/10  PSA  Total PSA 2.82 ng/dl 2.58  2.33  5.29  4.29  5.04  5.71  2.59   Free  PSA    1.24  1.02  1.03  1.01    % Free PSA    23  24  20  18       03/25/10 07/12/05  Hormones  Testosterone, Total 372.0  3.50     PROCEDURES:         Simple Foley Indwelling Cath Change - 51702  The patient's indwelling foley tube was carefully removed. A 16 French coude Foley catheter was inserted into the bladder using sterile technique. The patient was taught routine catheter care. Hand irrigation of the bladder with sterile water was performed. A bedside bag was connected.   ASSESSMENT:      ICD-10 Details  1 GU:   BPH w/LUTS - N40.1   2   Urinary Obstruction - N13.8    PLAN:           Orders Labs Urine Culture          Schedule Return Visit/Planned Activity: 1 Month - Nurse Visit             Note: catheter change          Document Letter(s):  Created for Patient: Clinical Summary         Notes:   The patient has urinary retention secondary to bladder outlet obstruction from an enlarged prostate. We have decided to proceed with transurethral resection of prostate. I again detailed procedure in the recovery for the patient. The timing still remains somewhat in question given the circumstances of the patient's living arrangements. The patient did have his Foley catheter changed today. If we're unable to get the surgery done within the next 4 weeks, we will reschedule him for a nurse visit and catheter exchange. Hopefully, we can get this done within the next 2 months.

## 2018-01-23 NOTE — Anesthesia Preprocedure Evaluation (Addendum)
Anesthesia Evaluation  Patient identified by MRN, date of birth, ID band Patient awake    Reviewed: Allergy & Precautions, NPO status , Patient's Chart, lab work & pertinent test results  Airway Mallampati: II  TM Distance: >3 FB Neck ROM: Full    Dental  (+) Caps, Dental Advisory Given,    Pulmonary neg pulmonary ROS,    breath sounds clear to auscultation       Cardiovascular negative cardio ROS   Rhythm:Regular Rate:Normal     Neuro/Psych PSYCHIATRIC DISORDERS Anxiety Depression negative neurological ROS     GI/Hepatic negative GI ROS, Neg liver ROS,   Endo/Other  negative endocrine ROS  Renal/GU negative Renal ROS     Musculoskeletal negative musculoskeletal ROS (+)   Abdominal   Peds  Hematology negative hematology ROS (+)   Anesthesia Other Findings   Reproductive/Obstetrics                             Anesthesia Physical Anesthesia Plan  ASA: III  Anesthesia Plan: General   Post-op Pain Management:    Induction: Intravenous  PONV Risk Score and Plan: 3 and Ondansetron, Dexamethasone and Treatment may vary due to age or medical condition  Airway Management Planned:   Additional Equipment:   Intra-op Plan:   Post-operative Plan: Extubation in OR  Informed Consent: I have reviewed the patients History and Physical, chart, labs and discussed the procedure including the risks, benefits and alternatives for the proposed anesthesia with the patient or authorized representative who has indicated his/her understanding and acceptance.   Dental advisory given  Plan Discussed with: CRNA  Anesthesia Plan Comments:        Anesthesia Quick Evaluation

## 2018-01-23 NOTE — Anesthesia Postprocedure Evaluation (Signed)
Anesthesia Post Note  Patient: Kyle Ball  Procedure(s) Performed: TRANSURETHRAL RESECTION OF THE PROSTATE (TURP) (N/A )     Patient location during evaluation: PACU Anesthesia Type: General Level of consciousness: sedated and patient cooperative Pain management: pain level controlled Vital Signs Assessment: post-procedure vital signs reviewed and stable Respiratory status: spontaneous breathing Cardiovascular status: stable Anesthetic complications: no Comments: New onset afib in PACU, but pt with intermittent SR as well. Rate controlled. Informed Dr. Louis Meckel and patient going on Tele    Last Vitals:  Vitals:   01/23/18 1945 01/23/18 2000  BP: (!) 153/87 (!) 162/82  Pulse: (!) 108 (!) 108  Resp: 18 19  Temp: 36.7 C   SpO2: 95% 95%    Last Pain:  Vitals:   01/23/18 2035  TempSrc:   PainSc: 0-No pain                 Nolon Nations

## 2018-01-23 NOTE — Op Note (Signed)
Preoperative diagnosis:  1. Bladder outlet obstruction 2. Urinary retention  Postoperative diagnosis:  1. same   Procedure:  1. Transurethral resection of prostate  Surgeon: Ardis Hughs, MD   Anesthesia: General   Complications: None   Intraoperative findings: Cystoscopy demonstrated a normal appearing bladder mucosa without tumor/stones/or abnormalities, with catheter related changes.  UOs were orthopic.  Prostate demonstrated a very large median lobe with mild lateral lobe obstruction.  EBL: 100cc   Specimens: Prostate chips  Indication: Kyle Ball is a 72 y.o. patient with bladder outlet obstruction/urinary retention/obstructive voiding symptoms.  After reviewing the management options for treatment, he elected to proceed with the above surgical procedure(s). We have discussed the potential benefits and risks of the procedure, side effects of the proposed treatment, the likelihood of the patient achieving the goals of the procedure, and any potential problems that might occur during the procedure or recuperation. Informed consent has been obtained.  Description of procedure:  The patient was taken to the operating room and general anesthesia was induced. The patient was placed in the dorsal lithotomy position, prepped and draped in the usual sterile fashion, and preoperative antibiotics were administered. A preoperative time-out was performed.   I then gently passed the 21 French 30 cystoscope into the patient's urethra and up into the bladder under visual guidance. A 360 cystoscopic evaluation was then performed with the above findings.  I then removed the 21 French cystoscopic sheath and replacement with a 26 French resectoscope sheath was passed and using the visual obturator under direct vision. An exchange the obturator for the resectoscope itself and the loop cautery.  I began by resecting the large obstructing median lobe.  I then proceeded to create grooves  within the prostate at the 7:00 position extending the defect from the bladder neck at 7:00 down to the prostate apex just lateral to the verumontanum. I took this groove down to the capsule. I then performed a similar maneuver on the patient's left side at the 5:00 position taking the prostate down from the bladder neck to the apex and down the prostatic capsule. At this point I proceeded to resect the patient's right lateral lobe in a systematic fashion moving from the 7:00 position up to approximately 11. The resection was taken down to the prostate capsule. Hemostasis was achieved on this side prior to moving to the patient's left lateral lobe. A similar sequence was performed taking the prostate down from the 5:00 position to approximately the 1:00 position to the level of the prostatic capsule.  I was careful not to undermine the bladder neck or resect the inferior.    Once I was satisfied that the prostate had been adequately resected I evacuated the prostate chips using a [place] syringe. I then reintroduced the resectoscope and ensured adequate hemostasis. I then left the bladder full and removed the resectoscope entirely. Exam under anesthesia demonstrated a normal sized prostate with no nodules.  I then placed a 22 French three-way Foley catheter. I irrigated the catheter gently and removed all debris and clots. I then placed the patient on gentle continuous bladder irrigation.  He subsequently extubated and returned to PACU in excellent condition.  Ardis Hughs, MD

## 2018-01-24 ENCOUNTER — Encounter (HOSPITAL_COMMUNITY): Payer: Self-pay | Admitting: Urology

## 2018-01-24 DIAGNOSIS — N529 Male erectile dysfunction, unspecified: Secondary | ICD-10-CM | POA: Diagnosis not present

## 2018-01-24 DIAGNOSIS — R32 Unspecified urinary incontinence: Secondary | ICD-10-CM | POA: Diagnosis not present

## 2018-01-24 DIAGNOSIS — N320 Bladder-neck obstruction: Secondary | ICD-10-CM | POA: Diagnosis not present

## 2018-01-24 DIAGNOSIS — G20 Parkinson's disease: Secondary | ICD-10-CM | POA: Diagnosis not present

## 2018-01-24 DIAGNOSIS — E785 Hyperlipidemia, unspecified: Secondary | ICD-10-CM | POA: Diagnosis not present

## 2018-01-24 DIAGNOSIS — N138 Other obstructive and reflux uropathy: Secondary | ICD-10-CM | POA: Diagnosis not present

## 2018-01-24 DIAGNOSIS — N183 Chronic kidney disease, stage 3 (moderate): Secondary | ICD-10-CM | POA: Diagnosis not present

## 2018-01-24 DIAGNOSIS — F419 Anxiety disorder, unspecified: Secondary | ICD-10-CM | POA: Diagnosis not present

## 2018-01-24 DIAGNOSIS — Z79899 Other long term (current) drug therapy: Secondary | ICD-10-CM | POA: Diagnosis not present

## 2018-01-24 DIAGNOSIS — N401 Enlarged prostate with lower urinary tract symptoms: Secondary | ICD-10-CM | POA: Diagnosis not present

## 2018-01-24 DIAGNOSIS — Z7982 Long term (current) use of aspirin: Secondary | ICD-10-CM | POA: Diagnosis not present

## 2018-01-24 DIAGNOSIS — R339 Retention of urine, unspecified: Secondary | ICD-10-CM | POA: Diagnosis not present

## 2018-01-24 LAB — BASIC METABOLIC PANEL
Anion gap: 8 (ref 5–15)
BUN: 12 mg/dL (ref 8–23)
CALCIUM: 9 mg/dL (ref 8.9–10.3)
CHLORIDE: 103 mmol/L (ref 98–111)
CO2: 29 mmol/L (ref 22–32)
CREATININE: 0.64 mg/dL (ref 0.61–1.24)
Glucose, Bld: 140 mg/dL — ABNORMAL HIGH (ref 70–99)
Potassium: 4.7 mmol/L (ref 3.5–5.1)
SODIUM: 140 mmol/L (ref 135–145)

## 2018-01-24 MED ORDER — LIDOCAINE HCL URETHRAL/MUCOSAL 2 % EX GEL
1.0000 "application " | Freq: Once | CUTANEOUS | Status: DC
  Filled 2018-01-24: qty 5

## 2018-01-24 MED ORDER — TRAMADOL HCL 50 MG PO TABS: 50.0000 mg | ORAL_TABLET | Freq: Four times a day (QID) | ORAL | 0 refills | Status: DC | PRN

## 2018-01-24 MED ORDER — DOCUSATE SODIUM 100 MG PO CAPS: 100.0000 mg | ORAL_CAPSULE | Freq: Two times a day (BID) | ORAL | 2 refills | Status: DC | PRN

## 2018-01-24 NOTE — Discharge Summary (Signed)
Date of admission: 01/23/2018  Date of discharge: 8/15/2019k  Admission diagnosis: chronic urinary retention  Discharge diagnosis: same  Secondary diagnoses:  Patient Active Problem List   Diagnosis Date Noted  . BPH with obstruction/lower urinary tract symptoms 01/23/2018  . Parkinson disease ([place]) 10/29/2017  . Acute lower UTI 10/27/2017  . UTI (urinary tract infection) 10/27/2017  . Medicare annual wellness visit, subsequent 12/04/2016  . Obesity 12/04/2016  . Hyperglycemia 02/04/2015  . Elevated blood pressure reading without diagnosis of hypertension 03/05/2013  . Nonspecific abnormal electrocardiogram (ECG) (EKG) 08/31/2011  . Parkinson's disease ([place]) 06/21/2010  . ANXIETY 07/01/2007  . BPH (benign prostatic hyperplasia) 07/01/2007  . HYPERLIPIDEMIA 04/10/2007  . DISORDER, DYSMETABOLIC SYNDROME X 26/83/4196  . DISORDER, DEPRESSIVE NEC 04/10/2007  . ELEVATED PROSTATE SPECIFIC ANTIGEN 04/10/2007  . History of non anemic vitamin B12 deficiency 10/26/2006  . ERECTILE DYSFUNCTION 10/26/2006  . Crohn's disease of small intestine with intestinal obstruction ([place]) 10/26/2006    Procedures performed: Procedure(s): TRANSURETHRAL RESECTION OF THE PROSTATE (TURP)  History and Physical: For full details, please see admission history and physical. Briefly, Kyle Ball is a 72 y.o. year old patient with parkinson disease who developed urinary retention several months ago.   Hospital Course: Patient tolerated the procedure well.  He was then transferred to the floor after an uneventful PACU stay.  His hospital course was uncomplicated.  On POD#1 he had met discharge criteria: was eating a regular diet, was up and ambulating independently,  pain was well controlled, was voiding without a catheter, and was ready to for discharge.  In the PACU the patient had sinus tachycardia with frequent PACs.  He was placed on telemetry and there were no events noted overnight.  HIs Potassium was  repleted to above 4.0, given 25 mg of Metoprolol, and a morning ECG was unremarkable.  No further treatment was administered, cardiology was not consulted.  PE: Vitals:   01/23/18 1945 01/23/18 2000 01/23/18 2357 01/24/18 0515  BP: (!) 153/87 (!) 162/82 (!) 143/84 (!) 159/79  Pulse: (!) 108 (!) 108 74 61  Resp: _0 Temp: 98 F (36.7 C)  98.1 F (36.7 C) 97.6 F (36.4 C)  TempSrc:   Oral   SpO2: 95% 95% 96% 98%  Weight:      Height:       NAD Abdomen soft Catheter removed.    Laboratory values:  Recent Labs    01/23/18 1316 01/23/18 2048  WBC 7.6 7.4  HGB 13.9 13.7  HCT 42.3 42.3   Recent Labs    01/23/18 1316 01/23/18 2048 01/24/18 0503  NA 142 142 140  K 3.7 3.6 4.7  CL 106 103 103  CO2 _1 GLUCOSE 87 124* 140*  BUN _2 CREATININE 0.70 0.79 0.64  CALCIUM 8.9 8.8* 9.0   No results for input(s): LABPT, INR in the last 72 hours. No results for input(s): LABURIN in the last 72 hours. Results for orders placed or performed during the hospital encounter of 10/27/17  Urine culture     Status: None   Collection Time: 10/27/17 12:49 PM  Result Value Ref Range Status   Specimen Description   Final    URINE, RANDOM Performed at [facility] [address], [place], [place] [address]    Special Requests   Final    NONE Performed at [facility], [place] [address], [place], [place] [address]    Culture  Final    NO GROWTH Performed at [facility], [place] [address] [place], [place] [address]    Report Status 10/28/2017 FINAL  Final    Disposition: Home  Discharge instruction: The patient was instructed to be ambulatory but told to refrain from heavy lifting, strenuous activity, or driving.   Discharge medications:  Allergies as of 01/24/2018      Reactions   Penicillins    CHILDHOOD ALLERGY Has patient had a PCN reaction causing immediate rash, facial/tongue/throat swelling, SOB or  lightheadedness with hypotension: Yes Has patient had a PCN reaction causing severe rash involving mucus membranes or skin necrosis: No Has patient had a PCN reaction that required hospitalization: No Has patient had a PCN reaction occurring within the last 10 years:No If all of the above answers are "NO", then may proceed with Cephalosporin use.      Medication List    TAKE these medications   atorvastatin 20 MG tablet Commonly known as:  LIPITOR Take 1 tablet (20 mg total) daily by mouth. Needs to establish with new PCP for more refills. What changed:    when to take this  additional instructions   budesonide 3 MG 24 hr capsule Commonly known as:  ENTOCORT EC Take 3 capsules (9 mg total) by mouth daily.   carbidopa-levodopa 25-250 MG tablet Commonly known as:  SINEMET IR Take 1 tablet by mouth 3 (three) times daily.   citalopram 20 MG tablet Commonly known as:  CELEXA Take 30 mg by mouth daily.   docusate sodium 100 MG capsule Commonly known as:  COLACE Take 1 capsule (100 mg total) by mouth 2 (two) times daily as needed for mild constipation.   traMADol 50 MG tablet Commonly known as:  ULTRAM Take 1-2 tablets (50-100 mg total) by mouth every 6 (six) hours as needed for moderate pain.       Followup:  Follow-up Information    Karen Kays, NP Follow up on 01/30/2018.   Specialty:  Nurse Practitioner Why:  catheter removal f/u on 9/16am at 9:30 once catheter has been removed.  Cancel appointment for catheter removal if it was removed in the hospital. Contact information: [address] [place] [address] [phone]

## 2018-01-24 NOTE — Progress Notes (Signed)
Patient is being discharged home. Discharge instruction were given to patient and family

## 2018-01-25 DIAGNOSIS — N401 Enlarged prostate with lower urinary tract symptoms: Secondary | ICD-10-CM | POA: Diagnosis not present

## 2018-01-25 DIAGNOSIS — F329 Major depressive disorder, single episode, unspecified: Secondary | ICD-10-CM | POA: Diagnosis not present

## 2018-01-25 DIAGNOSIS — K50012 Crohn's disease of small intestine with intestinal obstruction: Secondary | ICD-10-CM | POA: Diagnosis not present

## 2018-01-25 DIAGNOSIS — G20 Parkinson's disease: Secondary | ICD-10-CM | POA: Diagnosis not present

## 2018-01-25 DIAGNOSIS — E785 Hyperlipidemia, unspecified: Secondary | ICD-10-CM | POA: Diagnosis not present

## 2018-01-25 DIAGNOSIS — Z87440 Personal history of urinary (tract) infections: Secondary | ICD-10-CM | POA: Diagnosis not present

## 2018-01-26 ENCOUNTER — Encounter (HOSPITAL_COMMUNITY): Payer: Self-pay | Admitting: Emergency Medicine

## 2018-01-26 ENCOUNTER — Emergency Department (HOSPITAL_COMMUNITY)
Admission: EM | Admit: 2018-01-26 | Discharge: 2018-01-26 | Disposition: A | Discharge status: Home / Self Care | Payer: Medicare Other | Attending: Emergency Medicine | Admitting: Emergency Medicine

## 2018-01-26 DIAGNOSIS — Z79899 Other long term (current) drug therapy: Secondary | ICD-10-CM | POA: Diagnosis not present

## 2018-01-26 DIAGNOSIS — R319 Hematuria, unspecified: Secondary | ICD-10-CM | POA: Diagnosis not present

## 2018-01-26 DIAGNOSIS — G20 Parkinson's disease: Secondary | ICD-10-CM | POA: Insufficient documentation

## 2018-01-26 DIAGNOSIS — Y828 Other medical devices associated with adverse incidents: Secondary | ICD-10-CM | POA: Diagnosis not present

## 2018-01-26 DIAGNOSIS — I10 Essential (primary) hypertension: Secondary | ICD-10-CM | POA: Diagnosis not present

## 2018-01-26 DIAGNOSIS — R109 Unspecified abdominal pain: Secondary | ICD-10-CM | POA: Diagnosis present

## 2018-01-26 DIAGNOSIS — R197 Diarrhea, unspecified: Secondary | ICD-10-CM | POA: Diagnosis not present

## 2018-01-26 DIAGNOSIS — R410 Disorientation, unspecified: Secondary | ICD-10-CM | POA: Diagnosis not present

## 2018-01-26 DIAGNOSIS — T83098A Other mechanical complication of other indwelling urethral catheter, initial encounter: Secondary | ICD-10-CM | POA: Diagnosis not present

## 2018-01-26 DIAGNOSIS — T83091A Other mechanical complication of indwelling urethral catheter, initial encounter: Secondary | ICD-10-CM

## 2018-01-26 DIAGNOSIS — R1030 Lower abdominal pain, unspecified: Secondary | ICD-10-CM | POA: Diagnosis not present

## 2018-01-26 LAB — CBC
HEMATOCRIT: 39.6 % (ref 39.0–52.0)
Hemoglobin: 13 g/dL (ref 13.0–17.0)
MCH: 28.7 pg (ref 26.0–34.0)
MCHC: 32.8 g/dL (ref 30.0–36.0)
MCV: 87.4 fL (ref 78.0–100.0)
PLATELETS: 182 10*3/uL (ref 150–400)
RBC: 4.53 MIL/uL (ref 4.22–5.81)
RDW: 14.1 % (ref 11.5–15.5)
WBC: 8.5 10*3/uL (ref 4.0–10.5)

## 2018-01-26 LAB — BASIC METABOLIC PANEL
ANION GAP: 8 (ref 5–15)
BUN: 14 mg/dL (ref 8–23)
CO2: 31 mmol/L (ref 22–32)
Calcium: 9.1 mg/dL (ref 8.9–10.3)
Chloride: 103 mmol/L (ref 98–111)
Creatinine, Ser: 0.8 mg/dL (ref 0.61–1.24)
GFR calc Af Amer: >60 mL/min (ref >60)
GLUCOSE: 99 mg/dL (ref 70–99)
POTASSIUM: 3.6 mmol/L (ref 3.5–5.1)
Sodium: 142 mmol/L (ref 135–145)

## 2018-01-26 NOTE — ED Notes (Signed)
Urine output 1484mL.

## 2018-01-26 NOTE — Discharge Instructions (Addendum)
Follow-up with your urologist, call next week, return to the emergency room if you have any recurrent issues with your catheter

## 2018-01-26 NOTE — ED Triage Notes (Signed)
Patient arrived via [facility]. Patient had a procedure Wednesday here at [facility]. Shaved down prostate. Procedure unsuccessful. Urinary bag was full lastnight and changed around 9:00 pm. Patient has not had output since just a couple of ounces with blood clots in it. Pelvic pain and lower back pain. Constant. 3/10 pain.

## 2018-01-26 NOTE — ED Notes (Signed)
Bed: OV70 Expected date: 01/26/18 Expected time: 9:42 AM Means of arrival: Ambulance Comments: Unable to urinate, foley clogged

## 2018-01-26 NOTE — ED Notes (Signed)
BLADDER SCAN VOLUME-929mL

## 2018-01-26 NOTE — ED Provider Notes (Signed)
[facility] DEPT Provider Note   CSN: [id number] Arrival date & time: 01/26/18  0945     History   Chief Complaint Chief Complaint  Patient presents with  . Abdominal Pain  . Hematuria    HPI Kyle Ball is a 72 y.o. male.  HPI Patient presents to the emergency room for evaluation of decreased urine output from the Foley catheter.  Patient was recently in the hospital for prostate surgery which was performed on August 14.  Patient had difficulty with urinating after the procedure and was discharged with a Foley catheter.  Patient started to notice some bloody urine output last evening.  The catheter seemed to be draining appropriately yesterday but this morning when the patient woke up the catheter bag was empty.   When the nurses at the facility checked him they noted the patient seemed to be leaking around the Foley catheter.  He also does have fullness in his suprapubic region.  No fevers or chills.  No abdominal pain. Past Medical History:  Diagnosis Date  . Anxiety and depression   . BPH (benign prostatic hypertrophy)   . Crohn's disease ([place]) 1979   After resection, 1 flare in 2012.  Marland Kitchen Dysmetabolic syndrome X   . ED (erectile dysfunction)   . Hematuria   . Hyperlipidemia   . Internal hemorrhoids   . Other abnormalities of gait and mobility   . Parkinson's disease ([place])    [place]  . Repeated falls     Patient Active Problem List   Diagnosis Date Noted  . BPH with obstruction/lower urinary tract symptoms 01/23/2018  . Parkinson disease ([place]) 10/29/2017  . Acute lower UTI 10/27/2017  . UTI (urinary tract infection) 10/27/2017  . Medicare annual wellness visit, subsequent 12/04/2016  . Obesity 12/04/2016  . Hyperglycemia 02/04/2015  . Elevated blood pressure reading without diagnosis of hypertension 03/05/2013  . Nonspecific abnormal electrocardiogram (ECG) (EKG) 08/31/2011  . Parkinson's disease ([place]) 06/21/2010  . ANXIETY  07/01/2007  . BPH (benign prostatic hyperplasia) 07/01/2007  . HYPERLIPIDEMIA 04/10/2007  . DISORDER, DYSMETABOLIC SYNDROME X 79/48/0165  . DISORDER, DEPRESSIVE NEC 04/10/2007  . ELEVATED PROSTATE SPECIFIC ANTIGEN 04/10/2007  . History of non anemic vitamin B12 deficiency 10/26/2006  . ERECTILE DYSFUNCTION 10/26/2006  . Crohn's disease of small intestine with intestinal obstruction ([place]) 10/26/2006    Past Surgical History:  Procedure Laterality Date  . COLONOSCOPY W/ BIOPSIES   06/2007, 2015   Crohn's, internal hemorrhoids, Dr Carlean Purl  . KNEE ARTHROSCOPY Right 06/2013  . PROSTATE BIOPSY     X 2, Dr Risa Grill  . PTNS Treatment     urinary incontinence  . QUADRICEPS REPAIR Right 1966    knee  . REFRACTIVE SURGERY  2015   Dr Bing Plume  . Terminal Ileum & Appendix Resected  1995   Crohn's  . TRANSURETHRAL RESECTION OF PROSTATE N/A 01/23/2018   Procedure: TRANSURETHRAL RESECTION OF THE PROSTATE (TURP);  Surgeon: Ardis Hughs, MD;  Location: [facility] ORS;  Service: Urology;  Laterality: N/A;        Home Medications    Prior to Admission medications   Medication Sig Start Date End Date Taking? Authorizing Provider  atorvastatin (LIPITOR) 20 MG tablet Take 1 tablet (20 mg total) daily by mouth. Needs to establish with new PCP for more refills. Patient taking differently: Take 20 mg by mouth at bedtime.  04/23/17  Yes Shambley, Delphia Grates, NP  budesonide (ENTOCORT EC) 3 MG 24 hr capsule Take 3  capsules (9 mg total) by mouth daily. 11/08/17  Yes Gatha Mayer, MD  carbidopa-levodopa (SINEMET IR) 25-250 MG tablet Take 1 tablet by mouth 3 (three) times daily. 10/17/17  Yes [provider]  citalopram (CELEXA) 20 MG tablet Take 30 mg by mouth daily.    Yes [provider]  docusate sodium (COLACE) 100 MG capsule Take 1 capsule (100 mg total) by mouth 2 (two) times daily as needed for mild constipation. 01/24/18 01/24/19 Yes Ardis Hughs, MD  dutasteride (AVODART) 0.5 MG  capsule Take 0.5 mg by mouth daily. 01/23/18  Yes [provider]  tamsulosin (FLOMAX) 0.4 MG CAPS capsule Take 0.4 mg by mouth at bedtime. 01/24/18  Yes [provider]  traMADol (ULTRAM) 50 MG tablet Take 1-2 tablets (50-100 mg total) by mouth every 6 (six) hours as needed for moderate pain. 01/24/18  Yes Ardis Hughs, MD    Family History Family History  Problem Relation Age of Onset  . Uterine cancer Mother        metastatic to liver  . Hypertension Father   . Diabetes Father   . Stroke Father 34  . Heart attack Father 69  . Testicular cancer Brother 75  . Fibromyalgia Sister   . Mental illness Sister        chronic depression  . Colon cancer Neg Hx   . Colon polyps Neg Hx   . Rectal cancer Neg Hx   . Stomach cancer Neg Hx     Social History Social History   Tobacco Use  . Smoking status: Never Smoker  . Smokeless tobacco: Never Used  Substance Use Topics  . Alcohol use: Yes    Alcohol/week: 14.0 standard drinks    Types: 14 Glasses of wine per week  . Drug use: No     Allergies   Penicillins   Review of Systems Review of Systems  All other systems reviewed and are negative.    Physical Exam Updated Vital Signs BP (!) 150/79 (BP Location: Right Arm)   Pulse 98   Temp 98.2 F (36.8 C) (Oral)   Resp 20   SpO2 99%   Physical Exam  Constitutional:  Non-toxic appearance. He does not appear ill. No distress.  HENT:  Head: Normocephalic and atraumatic.  Right Ear: External ear normal.  Left Ear: External ear normal.  Mouth/Throat: No oropharyngeal exudate.  Eyes: Pupils are equal, round, and reactive to light. Conjunctivae and EOM are normal. Right eye exhibits no discharge. Left eye exhibits no discharge. No scleral icterus.  Neck: Neck supple. No tracheal deviation present.  Cardiovascular: Normal rate, regular rhythm and normal heart sounds.  Pulmonary/Chest: Effort normal and breath sounds normal. No stridor. No respiratory  distress.  Abdominal: Normal appearance and bowel sounds are normal. He exhibits no distension. There is tenderness in the suprapubic area. There is no rigidity and no guarding. No hernia.  Genitourinary:  Genitourinary Comments: Foley catheter in place, catheter bag has a very small amount of bloody urine at the base of the bag  Musculoskeletal: He exhibits no edema.  Neurological: He is alert. Cranial nerve deficit: no gross deficits.  Skin: Skin is warm and dry. No rash noted.  Psychiatric: He has a normal mood and affect.  Nursing note and vitals reviewed.    ED Treatments / Results  Labs (all labs ordered are listed, but only abnormal results are displayed) Labs Reviewed  CBC  BASIC METABOLIC PANEL     Procedures Procedures (  including critical care time)  Medications Ordered in ED Medications - No data to display   Initial Impression / Assessment and Plan / ED Course  I have reviewed the triage vital signs and the nursing notes.  Pertinent labs & imaging results that were available during my care of the patient were reviewed by me and considered in my medical decision making (see chart for details).  Clinical Course as of Jan 26 1225  Sat Jan 26, 2018  1225 Patient's laboratory tests are normal   [JK]    Clinical Course User Index [JK] Dorie Rank, MD   Patient presented to the emergency room with an obstructed Foley catheter.  Nursing attempted to irrigate the initial catheter but attempts were unsuccessful.  Patient's catheter was removed and a three-way Foley catheter was inserted.  Patient's catheter was irrigated with 3 L of saline.  Patient now has clear urine from the catheter, no evidence of any recurrent bleeding or clots.  Patient feels much better at this point.  He is ready for discharge.  Discussed outpatient management and to follow-up with urology.  Final Clinical Impressions(s) / ED Diagnoses   Final diagnoses:  Obstructed Foley catheter, initial  encounter [facility])    ED Discharge Orders    None       Dorie Rank, MD 01/26/18 1226

## 2018-01-26 NOTE — ED Notes (Signed)
Continuous bladder irrigation with one 3050mL bag NS. Per Knapp,MD.

## 2018-01-26 NOTE — ED Notes (Signed)
Knapp,MD notified of patients urine characteristics.

## 2018-01-28 DIAGNOSIS — N401 Enlarged prostate with lower urinary tract symptoms: Secondary | ICD-10-CM | POA: Diagnosis not present

## 2018-01-28 DIAGNOSIS — K50012 Crohn's disease of small intestine with intestinal obstruction: Secondary | ICD-10-CM | POA: Diagnosis not present

## 2018-01-28 DIAGNOSIS — E785 Hyperlipidemia, unspecified: Secondary | ICD-10-CM | POA: Diagnosis not present

## 2018-01-28 DIAGNOSIS — Z87440 Personal history of urinary (tract) infections: Secondary | ICD-10-CM | POA: Diagnosis not present

## 2018-01-28 DIAGNOSIS — G20 Parkinson's disease: Secondary | ICD-10-CM | POA: Diagnosis not present

## 2018-01-28 DIAGNOSIS — F329 Major depressive disorder, single episode, unspecified: Secondary | ICD-10-CM | POA: Diagnosis not present

## 2018-01-29 DIAGNOSIS — K50012 Crohn's disease of small intestine with intestinal obstruction: Secondary | ICD-10-CM | POA: Diagnosis not present

## 2018-01-29 DIAGNOSIS — F329 Major depressive disorder, single episode, unspecified: Secondary | ICD-10-CM | POA: Diagnosis not present

## 2018-01-29 DIAGNOSIS — Z87440 Personal history of urinary (tract) infections: Secondary | ICD-10-CM | POA: Diagnosis not present

## 2018-01-29 DIAGNOSIS — N401 Enlarged prostate with lower urinary tract symptoms: Secondary | ICD-10-CM | POA: Diagnosis not present

## 2018-01-29 DIAGNOSIS — E785 Hyperlipidemia, unspecified: Secondary | ICD-10-CM | POA: Diagnosis not present

## 2018-01-29 DIAGNOSIS — G20 Parkinson's disease: Secondary | ICD-10-CM | POA: Diagnosis not present

## 2018-01-31 DIAGNOSIS — R338 Other retention of urine: Secondary | ICD-10-CM | POA: Diagnosis not present

## 2018-02-01 DIAGNOSIS — K50012 Crohn's disease of small intestine with intestinal obstruction: Secondary | ICD-10-CM | POA: Diagnosis not present

## 2018-02-01 DIAGNOSIS — Z87440 Personal history of urinary (tract) infections: Secondary | ICD-10-CM | POA: Diagnosis not present

## 2018-02-01 DIAGNOSIS — N401 Enlarged prostate with lower urinary tract symptoms: Secondary | ICD-10-CM | POA: Diagnosis not present

## 2018-02-01 DIAGNOSIS — E785 Hyperlipidemia, unspecified: Secondary | ICD-10-CM | POA: Diagnosis not present

## 2018-02-01 DIAGNOSIS — F329 Major depressive disorder, single episode, unspecified: Secondary | ICD-10-CM | POA: Diagnosis not present

## 2018-02-01 DIAGNOSIS — G20 Parkinson's disease: Secondary | ICD-10-CM | POA: Diagnosis not present

## 2018-02-04 ENCOUNTER — Telehealth: Payer: Self-pay | Admitting: Family Medicine

## 2018-02-04 DIAGNOSIS — K50012 Crohn's disease of small intestine with intestinal obstruction: Secondary | ICD-10-CM | POA: Diagnosis not present

## 2018-02-04 DIAGNOSIS — Z87440 Personal history of urinary (tract) infections: Secondary | ICD-10-CM | POA: Diagnosis not present

## 2018-02-04 DIAGNOSIS — E785 Hyperlipidemia, unspecified: Secondary | ICD-10-CM | POA: Diagnosis not present

## 2018-02-04 DIAGNOSIS — N401 Enlarged prostate with lower urinary tract symptoms: Secondary | ICD-10-CM | POA: Diagnosis not present

## 2018-02-04 DIAGNOSIS — G20 Parkinson's disease: Secondary | ICD-10-CM | POA: Diagnosis not present

## 2018-02-04 DIAGNOSIS — F329 Major depressive disorder, single episode, unspecified: Secondary | ICD-10-CM | POA: Diagnosis not present

## 2018-02-04 NOTE — Telephone Encounter (Signed)
Copied from [place]. Topic: Inquiry >> Feb 04, 2018  9:38 AM Scherrie Gerlach wrote:  Reason for CRM: adrianne with [facility] calling to get verbal for extension of home health PT 2 wk 4

## 2018-02-05 DIAGNOSIS — K50012 Crohn's disease of small intestine with intestinal obstruction: Secondary | ICD-10-CM | POA: Diagnosis not present

## 2018-02-05 DIAGNOSIS — E785 Hyperlipidemia, unspecified: Secondary | ICD-10-CM | POA: Diagnosis not present

## 2018-02-05 DIAGNOSIS — Z87440 Personal history of urinary (tract) infections: Secondary | ICD-10-CM | POA: Diagnosis not present

## 2018-02-05 DIAGNOSIS — G20 Parkinson's disease: Secondary | ICD-10-CM | POA: Diagnosis not present

## 2018-02-05 DIAGNOSIS — F329 Major depressive disorder, single episode, unspecified: Secondary | ICD-10-CM | POA: Diagnosis not present

## 2018-02-05 DIAGNOSIS — N401 Enlarged prostate with lower urinary tract symptoms: Secondary | ICD-10-CM | POA: Diagnosis not present

## 2018-02-05 NOTE — Telephone Encounter (Signed)
Called Adrianne from [facility] left a detailed message for approval on verbal orders for PT and Home health on pt

## 2018-02-07 DIAGNOSIS — Z87440 Personal history of urinary (tract) infections: Secondary | ICD-10-CM | POA: Diagnosis not present

## 2018-02-07 DIAGNOSIS — G20 Parkinson's disease: Secondary | ICD-10-CM | POA: Diagnosis not present

## 2018-02-07 DIAGNOSIS — E785 Hyperlipidemia, unspecified: Secondary | ICD-10-CM | POA: Diagnosis not present

## 2018-02-07 DIAGNOSIS — N401 Enlarged prostate with lower urinary tract symptoms: Secondary | ICD-10-CM | POA: Diagnosis not present

## 2018-02-07 DIAGNOSIS — K50012 Crohn's disease of small intestine with intestinal obstruction: Secondary | ICD-10-CM | POA: Diagnosis not present

## 2018-02-07 DIAGNOSIS — F329 Major depressive disorder, single episode, unspecified: Secondary | ICD-10-CM | POA: Diagnosis not present

## 2018-02-08 ENCOUNTER — Other Ambulatory Visit: Payer: Self-pay

## 2018-02-08 ENCOUNTER — Emergency Department (HOSPITAL_COMMUNITY)
Admission: EM | Admit: 2018-02-08 | Discharge: 2018-02-09 | Disposition: A | Discharge status: Home / Self Care | Payer: Medicare Other | Attending: Emergency Medicine | Admitting: Emergency Medicine

## 2018-02-08 ENCOUNTER — Encounter (HOSPITAL_COMMUNITY): Payer: Self-pay | Admitting: Emergency Medicine

## 2018-02-08 DIAGNOSIS — G20 Parkinson's disease: Secondary | ICD-10-CM | POA: Diagnosis not present

## 2018-02-08 DIAGNOSIS — S3733XA Laceration of urethra, initial encounter: Secondary | ICD-10-CM | POA: Diagnosis not present

## 2018-02-08 DIAGNOSIS — S3730XA Unspecified injury of urethra, initial encounter: Secondary | ICD-10-CM

## 2018-02-08 DIAGNOSIS — Y9389 Activity, other specified: Secondary | ICD-10-CM | POA: Diagnosis not present

## 2018-02-08 DIAGNOSIS — Y92129 Unspecified place in nursing home as the place of occurrence of the external cause: Secondary | ICD-10-CM | POA: Diagnosis not present

## 2018-02-08 DIAGNOSIS — X58XXXA Exposure to other specified factors, initial encounter: Secondary | ICD-10-CM | POA: Insufficient documentation

## 2018-02-08 DIAGNOSIS — F329 Major depressive disorder, single episode, unspecified: Secondary | ICD-10-CM | POA: Diagnosis not present

## 2018-02-08 DIAGNOSIS — R0902 Hypoxemia: Secondary | ICD-10-CM | POA: Diagnosis not present

## 2018-02-08 DIAGNOSIS — F419 Anxiety disorder, unspecified: Secondary | ICD-10-CM | POA: Insufficient documentation

## 2018-02-08 DIAGNOSIS — R339 Retention of urine, unspecified: Secondary | ICD-10-CM | POA: Insufficient documentation

## 2018-02-08 DIAGNOSIS — Y998 Other external cause status: Secondary | ICD-10-CM | POA: Insufficient documentation

## 2018-02-08 DIAGNOSIS — Z79899 Other long term (current) drug therapy: Secondary | ICD-10-CM | POA: Insufficient documentation

## 2018-02-08 LAB — URINALYSIS, ROUTINE W REFLEX MICROSCOPIC
Bilirubin Urine: NEGATIVE
Glucose, UA: NEGATIVE mg/dL
KETONES UR: NEGATIVE mg/dL
Nitrite: NEGATIVE
PROTEIN: 30 mg/dL — AB
RBC / HPF: >50 RBC/hpf — ABNORMAL HIGH (ref 0–5)
Specific Gravity, Urine: 1.008 (ref 1.005–1.030)
WBC, UA: >50 WBC/hpf — ABNORMAL HIGH (ref 0–5)
pH: 5 (ref 5.0–8.0)

## 2018-02-08 MED ORDER — LIDOCAINE HCL URETHRAL/MUCOSAL 2 % EX GEL
1.0000 "application " | Freq: Once | CUTANEOUS | Status: AC | PRN
  Administered 2018-02-08: 1 via URETHRAL
  Filled 2018-02-08: qty 5

## 2018-02-08 NOTE — ED Notes (Signed)
Bed: KK44 Expected date:  Expected time:  Means of arrival:  Comments: 49M Urinary retention- chronic foley

## 2018-02-08 NOTE — ED Triage Notes (Signed)
Pt BIB EMS from [facility] senior living with complaints of urinary rentention. Patient states that foley was emptied x3 hours ago and has no current urine return. Patient states he feels like he is "full" and needs to urinate. Patient is a chronic foley catheter user.

## 2018-02-08 NOTE — ED Notes (Signed)
Patient noted to have active bleeding from meatus. EDP at bedside before foley insertion,

## 2018-02-08 NOTE — ED Provider Notes (Signed)
[facility] DEPT Provider Note   CSN: [id number] Arrival date & time: 02/08/18  2213     History   Chief Complaint Chief Complaint  Patient presents with  . Urinary Retention    HPI ASIR Kyle Ball is a 72 y.o. male.  Patient here with stepping on his catheter causing to pull out of his urethra and not work anymore.  Has suprapubic pain but no other associated symptoms.     Past Medical History:  Diagnosis Date  . Anxiety and depression   . BPH (benign prostatic hypertrophy)   . Crohn's disease ([place]) 1979   After resection, 1 flare in 2012.  Marland Kitchen Dysmetabolic syndrome X   . ED (erectile dysfunction)   . Hematuria   . Hyperlipidemia   . Internal hemorrhoids   . Other abnormalities of gait and mobility   . Parkinson's disease ([place])    [place]  . Repeated falls     Patient Active Problem List   Diagnosis Date Noted  . BPH with obstruction/lower urinary tract symptoms 01/23/2018  . Parkinson disease ([place]) 10/29/2017  . Acute lower UTI 10/27/2017  . UTI (urinary tract infection) 10/27/2017  . Medicare annual wellness visit, subsequent 12/04/2016  . Obesity 12/04/2016  . Hyperglycemia 02/04/2015  . Elevated blood pressure reading without diagnosis of hypertension 03/05/2013  . Nonspecific abnormal electrocardiogram (ECG) (EKG) 08/31/2011  . Parkinson's disease ([place]) 06/21/2010  . ANXIETY 07/01/2007  . BPH (benign prostatic hyperplasia) 07/01/2007  . HYPERLIPIDEMIA 04/10/2007  . DISORDER, DYSMETABOLIC SYNDROME X 24/40/1027  . DISORDER, DEPRESSIVE NEC 04/10/2007  . ELEVATED PROSTATE SPECIFIC ANTIGEN 04/10/2007  . History of non anemic vitamin B12 deficiency 10/26/2006  . ERECTILE DYSFUNCTION 10/26/2006  . Crohn's disease of small intestine with intestinal obstruction ([place]) 10/26/2006    Past Surgical History:  Procedure Laterality Date  . COLONOSCOPY W/ BIOPSIES   06/2007, 2015   Crohn's, internal hemorrhoids, Dr Carlean Purl  . KNEE  ARTHROSCOPY Right 06/2013  . PROSTATE BIOPSY     X 2, Dr Risa Grill  . PTNS Treatment     urinary incontinence  . QUADRICEPS REPAIR Right 1966    knee  . REFRACTIVE SURGERY  2015   Dr Bing Plume  . Terminal Ileum & Appendix Resected  1995   Crohn's  . TRANSURETHRAL RESECTION OF PROSTATE N/A 01/23/2018   Procedure: TRANSURETHRAL RESECTION OF THE PROSTATE (TURP);  Surgeon: Ardis Hughs, MD;  Location: [facility];  Service: Urology;  Laterality: N/A;        Home Medications    Prior to Admission medications   Medication Sig Start Date End Date Taking? Authorizing Provider  atorvastatin (LIPITOR) 20 MG tablet Take 1 tablet (20 mg total) daily by mouth. Needs to establish with new PCP for more refills. Patient taking differently: Take 20 mg by mouth at bedtime.  04/23/17   Lance Sell, NP  budesonide (ENTOCORT EC) 3 MG 24 hr capsule Take 3 capsules (9 mg total) by mouth daily. 11/08/17   Gatha Mayer, MD  carbidopa-levodopa (SINEMET IR) 25-250 MG tablet Take 1 tablet by mouth 3 (three) times daily. 10/17/17   [provider]  citalopram (CELEXA) 20 MG tablet Take 30 mg by mouth daily.     [provider]  docusate sodium (COLACE) 100 MG capsule Take 1 capsule (100 mg total) by mouth 2 (two) times daily as needed for mild constipation. 01/24/18 01/24/19  Ardis Hughs, MD  dutasteride (AVODART) 0.5 MG capsule Take 0.5 mg  by mouth daily. 01/23/18   [provider]  tamsulosin (FLOMAX) 0.4 MG CAPS capsule Take 0.4 mg by mouth at bedtime. 01/24/18   [provider]  traMADol (ULTRAM) 50 MG tablet Take 1-2 tablets (50-100 mg total) by mouth every 6 (six) hours as needed for moderate pain. 01/24/18   Ardis Hughs, MD    Family History Family History  Problem Relation Age of Onset  . Uterine cancer Mother        metastatic to liver  . Hypertension Father   . Diabetes Father   . Stroke Father 16  . Heart attack Father 28  . Testicular cancer  Brother 56  . Fibromyalgia Sister   . Mental illness Sister        chronic depression  . Colon cancer Neg Hx   . Colon polyps Neg Hx   . Rectal cancer Neg Hx   . Stomach cancer Neg Hx     Social History Social History   Tobacco Use  . Smoking status: Never Smoker  . Smokeless tobacco: Never Used  Substance Use Topics  . Alcohol use: Yes    Alcohol/week: 14.0 standard drinks    Types: 14 Glasses of wine per week  . Drug use: No     Allergies   Penicillins   Review of Systems Review of Systems  Genitourinary: Positive for frequency.  All other systems reviewed and are negative.    Physical Exam Updated Vital Signs BP 126/70 (BP Location: Left Arm)   Pulse 69   Temp 98.7 F (37.1 C) (Oral)   Resp 18   SpO2 98%   Physical Exam  Constitutional: He appears well-developed and well-nourished.  HENT:  Head: Normocephalic and atraumatic.  Eyes: Conjunctivae and EOM are normal.  Neck: Normal range of motion.  Cardiovascular: Normal rate.  Pulmonary/Chest: Effort normal. No respiratory distress.  Abdominal: He exhibits no distension.  Genitourinary: Penile tenderness (where urethra is torn) present.  Musculoskeletal: Normal range of motion.  Neurological: He is alert.  Nursing note and vitals reviewed.    ED Treatments / Results  Labs (all labs ordered are listed, but only abnormal results are displayed) Labs Reviewed  URINALYSIS, ROUTINE W REFLEX MICROSCOPIC - Abnormal; Notable for the following components:      Result Value   APPearance HAZY (*)    Hgb urine dipstick LARGE (*)    Protein, ur 30 (*)    Leukocytes, UA LARGE (*)    RBC / HPF >50 (*)    WBC, UA >50 (*)    Bacteria, UA FEW (*)    All other components within normal limits  URINE CULTURE    EKG None  Radiology No results found.  Procedures Procedures (including critical care time)  Medications Ordered in ED Medications  lidocaine (XYLOCAINE) 2 % jelly 1 application (1 application  Urethral Given 02/08/18 2305)     Initial Impression / Assessment and Plan / ED Course  I have reviewed the triage vital signs and the nursing notes.  Pertinent labs & imaging results that were available during my care of the patient were reviewed by me and considered in my medical decision making (see chart for details).     Catheter replaced. Will fu w/ urology but likely definitive treatment is foley catheter.   Final Clinical Impressions(s) / ED Diagnoses   Final diagnoses:  Urinary retention  Injury of urethra, initial encounter    ED Discharge Orders    None  Merrily Pew, MD 02/08/18 [phone]

## 2018-02-08 NOTE — ED Notes (Signed)
[facility] called for transport.  

## 2018-02-09 DIAGNOSIS — Z7401 Bed confinement status: Secondary | ICD-10-CM | POA: Diagnosis not present

## 2018-02-09 DIAGNOSIS — R279 Unspecified lack of coordination: Secondary | ICD-10-CM | POA: Diagnosis not present

## 2018-02-09 NOTE — ED Notes (Addendum)
Patient unable to sign for self due to inability to walk to the pad. Given verbal permission to sign for patient.

## 2018-02-10 LAB — URINE CULTURE: CULTURE: NO GROWTH

## 2018-02-11 ENCOUNTER — Emergency Department (HOSPITAL_COMMUNITY)
Admission: EM | Admit: 2018-02-11 | Discharge: 2018-02-12 | Disposition: A | Discharge status: Home / Self Care | Payer: Medicare Other | Attending: Emergency Medicine | Admitting: Emergency Medicine

## 2018-02-11 ENCOUNTER — Encounter (HOSPITAL_COMMUNITY): Payer: Self-pay | Admitting: Emergency Medicine

## 2018-02-11 DIAGNOSIS — T839XXA Unspecified complication of genitourinary prosthetic device, implant and graft, initial encounter: Secondary | ICD-10-CM | POA: Diagnosis not present

## 2018-02-11 DIAGNOSIS — I10 Essential (primary) hypertension: Secondary | ICD-10-CM | POA: Diagnosis not present

## 2018-02-11 DIAGNOSIS — G20 Parkinson's disease: Secondary | ICD-10-CM | POA: Insufficient documentation

## 2018-02-11 DIAGNOSIS — R1084 Generalized abdominal pain: Secondary | ICD-10-CM | POA: Diagnosis not present

## 2018-02-11 DIAGNOSIS — N390 Urinary tract infection, site not specified: Secondary | ICD-10-CM | POA: Diagnosis not present

## 2018-02-11 DIAGNOSIS — R319 Hematuria, unspecified: Secondary | ICD-10-CM | POA: Diagnosis not present

## 2018-02-11 DIAGNOSIS — R339 Retention of urine, unspecified: Secondary | ICD-10-CM | POA: Diagnosis not present

## 2018-02-11 DIAGNOSIS — R52 Pain, unspecified: Secondary | ICD-10-CM | POA: Diagnosis not present

## 2018-02-11 DIAGNOSIS — Z743 Need for continuous supervision: Secondary | ICD-10-CM | POA: Diagnosis not present

## 2018-02-11 DIAGNOSIS — Y69 Unspecified misadventure during surgical and medical care: Secondary | ICD-10-CM | POA: Diagnosis not present

## 2018-02-11 DIAGNOSIS — R279 Unspecified lack of coordination: Secondary | ICD-10-CM | POA: Diagnosis not present

## 2018-02-11 DIAGNOSIS — T83098A Other mechanical complication of other indwelling urethral catheter, initial encounter: Secondary | ICD-10-CM | POA: Diagnosis not present

## 2018-02-11 LAB — URINALYSIS, MICROSCOPIC (REFLEX)
RBC / HPF: >50 RBC/hpf (ref 0–5)
WBC, UA: >50 WBC/hpf (ref 0–5)

## 2018-02-11 LAB — URINALYSIS, ROUTINE W REFLEX MICROSCOPIC
Glucose, UA: NEGATIVE mg/dL
Ketones, ur: NEGATIVE mg/dL
Nitrite: POSITIVE — AB
pH: 5.5 (ref 5.0–8.0)

## 2018-02-11 LAB — CBC
HCT: 38.1 % — ABNORMAL LOW (ref 39.0–52.0)
Hemoglobin: 12.4 g/dL — ABNORMAL LOW (ref 13.0–17.0)
MCH: 28.1 pg (ref 26.0–34.0)
MCHC: 32.5 g/dL (ref 30.0–36.0)
MCV: 86.4 fL (ref 78.0–100.0)
PLATELETS: 253 10*3/uL (ref 150–400)
RBC: 4.41 MIL/uL (ref 4.22–5.81)
RDW: 13.7 % (ref 11.5–15.5)
WBC: 10.5 10*3/uL (ref 4.0–10.5)

## 2018-02-11 MED ORDER — CIPROFLOXACIN IN D5W 400 MG/200ML IV SOLN: 400.0000 mg | Freq: Once | INTRAVENOUS | Status: DC

## 2018-02-11 MED ORDER — CIPROFLOXACIN HCL 500 MG PO TABS: 500.0000 mg | ORAL_TABLET | Freq: Two times a day (BID) | ORAL | 0 refills | Status: AC

## 2018-02-11 MED ORDER — CIPROFLOXACIN HCL 500 MG PO TABS
500.0000 mg | ORAL_TABLET | Freq: Once | ORAL | Status: AC
  Administered 2018-02-11: 500 mg via ORAL
  Filled 2018-02-11: qty 1

## 2018-02-11 MED ORDER — LIDOCAINE HCL URETHRAL/MUCOSAL 2 % EX GEL
1.0000 "application " | Freq: Once | CUTANEOUS | Status: AC
  Administered 2018-02-11: 1 via URETHRAL
  Filled 2018-02-11: qty 5

## 2018-02-11 NOTE — ED Notes (Signed)
Bed: IF12 Expected date:  Expected time:  Means of arrival:  Comments: 72 yo M/SNF-Urinary Retention

## 2018-02-11 NOTE — ED Provider Notes (Signed)
[facility] Emergency Department Provider Note MRN:  [id number]  Arrival date & time: 02/11/18     Chief Complaint   Urinary Retention (more foley cath injury )   History of Present Illness   Kyle Ball is a 72 y.o. year-old male with a history of BPH, Parkinson's presenting to the ED with chief complaint of urinary retention.  Recent TURP procedure 2 to 3 weeks ago.  Recent ED visit 2 to 3 days ago for urinary retention, Foley catheter placed at that time.  Here with continued hematuria, intermittent urinary retention.  Endorsing intermittent suprapubic abdominal pain, mild to moderate in severity, feels like a pressure sensation.  Denies fevers or chills, no chest pain or shortness of breath.  Review of Systems  A complete 10 system review of systems was obtained and all systems are negative except as noted in the HPI and PMH.   Patient's Health History    Past Medical History:  Diagnosis Date  . Anxiety and depression   . BPH (benign prostatic hypertrophy)   . Crohn's disease ([place]) 1979   After resection, 1 flare in 2012.  Marland Kitchen Dysmetabolic syndrome X   . ED (erectile dysfunction)   . Hematuria   . Hyperlipidemia   . Internal hemorrhoids   . Other abnormalities of gait and mobility   . Parkinson's disease ([place])    [place]  . Repeated falls     Past Surgical History:  Procedure Laterality Date  . COLONOSCOPY W/ BIOPSIES   06/2007, 2015   Crohn's, internal hemorrhoids, Dr Carlean Purl  . KNEE ARTHROSCOPY Right 06/2013  . PROSTATE BIOPSY     X 2, Dr Risa Grill  . PTNS Treatment     urinary incontinence  . QUADRICEPS REPAIR Right 1966    knee  . REFRACTIVE SURGERY  2015   Dr Bing Plume  . Terminal Ileum & Appendix Resected  1995   Crohn's  . TRANSURETHRAL RESECTION OF PROSTATE N/A 01/23/2018   Procedure: TRANSURETHRAL RESECTION OF THE PROSTATE (TURP);  Surgeon: Ardis Hughs, MD;  Location: [facility];  Service: Urology;  Laterality: N/A;    Family History    Problem Relation Age of Onset  . Uterine cancer Mother        metastatic to liver  . Hypertension Father   . Diabetes Father   . Stroke Father 68  . Heart attack Father 26  . Testicular cancer Brother 34  . Fibromyalgia Sister   . Mental illness Sister        chronic depression  . Colon cancer Neg Hx   . Colon polyps Neg Hx   . Rectal cancer Neg Hx   . Stomach cancer Neg Hx     Social History   Socioeconomic History  . Marital status: Married    Spouse name: Not on file  . Number of children: 2  . Years of education: 45  . Highest education level: Not on file  Occupational History  . Occupation: Retired     Fish farm manager: [employer]: [employer]  . Financial resource strain: Not on file  . Food insecurity:    Worry: Not on file    Inability: Not on file  . Transportation needs:    Medical: Not on file    Non-medical: Not on file  Tobacco Use  . Smoking status: Never Smoker  . Smokeless tobacco: Never Used  Substance and Sexual Activity  . Alcohol use: Yes  Alcohol/week: 14.0 standard drinks    Types: 14 Glasses of wine per week  . Drug use: No  . Sexual activity: Not on file  Lifestyle  . Physical activity:    Days per week: Not on file    Minutes per session: Not on file  . Stress: Not on file  Relationships  . Social connections:    Talks on phone: Not on file    Gets together: Not on file    Attends religious service: Not on file    Active member of club or organization: Not on file    Attends meetings of clubs or organizations: Not on file    Relationship status: Not on file  . Intimate partner violence:    Fear of current or ex partner: Not on file    Emotionally abused: Not on file    Physically abused: Not on file    Forced sexual activity: Not on file  Other Topics Concern  . Not on file  Social History Narrative   Fun/Hobby: Working around the house and fixing things      Physical Exam  Vital Signs and Nursing  Notes reviewed Vitals:   02/11/18 2014 02/11/18 2300  BP: (!) 141/80 134/67  Pulse: 73 64  Resp: 18 17  Temp: 98.8 F (37.1 C) 98 F (36.7 C)  SpO2: 98% 97%    CONSTITUTIONAL: Chronically ill-appearing, NAD NEURO:  Alert and oriented x 3, no focal deficits EYES:  eyes equal and reactive ENT/NECK:  no LAD, no JVD CARDIO: Regular rate, well-perfused, normal S1 and S2 PULM:  CTAB no wheezing or rhonchi GI/GU:  normal bowel sounds, non-distended, non-tender, no active bleeding or wounds to the penis, Foley catheter in place. MSK/SPINE:  No gross deformities, no edema SKIN:  no rash, atraumatic PSYCH:  Appropriate speech and behavior  Diagnostic and Interventional Summary    EKG Interpretation  Date/Time:    Ventricular Rate:    PR Interval:    QRS Duration:   QT Interval:    QTC Calculation:   R Axis:     Text Interpretation:        Labs Reviewed  CBC - Abnormal; Notable for the following components:      Result Value   Hemoglobin 12.4 (*)    HCT 38.1 (*)    All other components within normal limits  URINALYSIS, ROUTINE W REFLEX MICROSCOPIC - Abnormal; Notable for the following components:   Color, Urine BROWN (*)    APPearance TURBID (*)    Specific Gravity, Urine >1.030 (*)    Hgb urine dipstick LARGE (*)    Bilirubin Urine SMALL (*)    Protein, ur >300 (*)    Nitrite POSITIVE (*)    Leukocytes, UA MODERATE (*)    All other components within normal limits  URINALYSIS, MICROSCOPIC (REFLEX) - Abnormal; Notable for the following components:   Bacteria, UA MANY (*)    All other components within normal limits    No orders to display    Medications  lidocaine (XYLOCAINE) 2 % jelly 1 application (1 application Urethral Given 02/11/18 2202)  ciprofloxacin (CIPRO) tablet 500 mg (500 mg Oral Given 02/11/18 2302)     Procedures Critical Care  ED Course and Medical Decision Making  I have reviewed the triage vital signs and the nursing notes.  Pertinent labs &  imaging results that were available during my care of the patient were reviewed by me and considered in my medical decision making (see  below for details). Clinical Course as of Feb 11 2333  Mon Feb 11, 2018  2029 Question of UTI versus small amount of bleeding/hematuria related to recent Foley catheter trauma.  Will screen with CBC to ensure no dangerous amount of blood loss, will check UA.  Bladder scan in progress, if retaining will irrigate.   [MB]    Clinical Course User Index [MB] Maudie Flakes, MD    750 cc in the bladder, unable to irrigate, Foley exchanged for larger catheter with good flow.  UA consistent with infection, given penicillin allergy will give prescription for Cipro.  Follow-up with urologist.  After the discussed management above, the patient was determined to be safe for discharge.  The patient was in agreement with this plan and all questions regarding their care were answered.  ED return precautions were discussed and the patient will return to the ED with any significant worsening of condition.  Barth Kirks. [place]onia Small, [place] [email or web address]  Final Clinical Impressions(s) / ED Diagnoses     ICD-10-CM   1. Urinary retention R33.9   2. Hematuria, unspecified type R31.9   3. Problem with Foley catheter, initial encounter ([place]) T83.9XXA   4. Lower urinary tract infectious disease N39.0     ED Discharge Orders         Ordered    ciprofloxacin (CIPRO) 500 MG tablet  2 times daily     02/11/18 2242             Maudie Flakes, MD 02/11/18 [phone]

## 2018-02-11 NOTE — ED Triage Notes (Signed)
Pt comes to ed via ems, pt has darker urine than normal and got his foley cath snagged and has bleeding a site. Vs 140/74, pluses 73, rr16, spo2 96, cbg 93. Alert x 4. [address], [facility].

## 2018-02-11 NOTE — ED Notes (Signed)
Bed: [facility] Expected date:  Expected time:  Means of arrival:  Comments: 

## 2018-02-11 NOTE — Discharge Instructions (Signed)
You were evaluated in the Emergency Department and after careful evaluation, we did not find any emergent condition requiring admission or further testing in the hospital.  Your symptoms today seem to be due to a urinary tract infection.  We replaced your Foley catheter today.  Please use the antibiotic as directed and follow-up with your urologist.  Please return to the Emergency Department if you experience any worsening of your condition.  We encourage you to follow up with a primary care provider.  Thank you for allowing [place] to be a part of your care.

## 2018-02-12 DIAGNOSIS — N401 Enlarged prostate with lower urinary tract symptoms: Secondary | ICD-10-CM | POA: Diagnosis not present

## 2018-02-12 DIAGNOSIS — G20 Parkinson's disease: Secondary | ICD-10-CM | POA: Diagnosis not present

## 2018-02-12 DIAGNOSIS — K50012 Crohn's disease of small intestine with intestinal obstruction: Secondary | ICD-10-CM | POA: Diagnosis not present

## 2018-02-12 DIAGNOSIS — E785 Hyperlipidemia, unspecified: Secondary | ICD-10-CM | POA: Diagnosis not present

## 2018-02-12 DIAGNOSIS — F329 Major depressive disorder, single episode, unspecified: Secondary | ICD-10-CM | POA: Diagnosis not present

## 2018-02-12 DIAGNOSIS — Z87440 Personal history of urinary (tract) infections: Secondary | ICD-10-CM | POA: Diagnosis not present

## 2018-02-12 NOTE — Telephone Encounter (Signed)
[name] Pulse with [facility] calling to advise they have been trying to fax orders for home health nursing, but cannot get through. She is going to try again, but all pt has orders for is OT and PT.   Pt really needs nursing orders asap, as he needs assistance with his catheter.  [name] Pulse @ [facility] [phone]

## 2018-02-12 NOTE — Telephone Encounter (Signed)
Please Advise if ok to give verbal orders, Thank you

## 2018-02-13 ENCOUNTER — Telehealth: Payer: Self-pay | Admitting: Family Medicine

## 2018-02-13 NOTE — Telephone Encounter (Signed)
Orders received awaits completing

## 2018-02-13 NOTE — Telephone Encounter (Signed)
Orders for skilled nursing have been approved and fax back to [facility]

## 2018-02-13 NOTE — Telephone Encounter (Signed)
Ok to give VO? 

## 2018-02-13 NOTE — Telephone Encounter (Signed)
Copied from Agency [id number]. Topic: Quick Communication - See Telephone Encounter >> Feb 13, 2018  8:26 AM Synthia Innocent wrote: CRM for notification. See Telephone encounter for: 02/13/18.  [facility] Requesting order for [facility] for foley cath care, x monthly and PRN. Order was faxed # [phone]

## 2018-02-14 DIAGNOSIS — F329 Major depressive disorder, single episode, unspecified: Secondary | ICD-10-CM | POA: Diagnosis not present

## 2018-02-14 DIAGNOSIS — Z87440 Personal history of urinary (tract) infections: Secondary | ICD-10-CM | POA: Diagnosis not present

## 2018-02-14 DIAGNOSIS — K50012 Crohn's disease of small intestine with intestinal obstruction: Secondary | ICD-10-CM | POA: Diagnosis not present

## 2018-02-14 DIAGNOSIS — E785 Hyperlipidemia, unspecified: Secondary | ICD-10-CM | POA: Diagnosis not present

## 2018-02-14 DIAGNOSIS — N401 Enlarged prostate with lower urinary tract symptoms: Secondary | ICD-10-CM | POA: Diagnosis not present

## 2018-02-14 DIAGNOSIS — G20 Parkinson's disease: Secondary | ICD-10-CM | POA: Diagnosis not present

## 2018-02-14 NOTE — Telephone Encounter (Signed)
Spoke with Kyle Ball with [facility] gave verbal orders per Dr Volanda Napoleon but states that the facility needs a written order signed by Dr Volanda Napoleon. Waiting for the facility to fax the order request

## 2018-02-15 DIAGNOSIS — K50012 Crohn's disease of small intestine with intestinal obstruction: Secondary | ICD-10-CM | POA: Diagnosis not present

## 2018-02-15 DIAGNOSIS — Z87440 Personal history of urinary (tract) infections: Secondary | ICD-10-CM | POA: Diagnosis not present

## 2018-02-15 DIAGNOSIS — G20 Parkinson's disease: Secondary | ICD-10-CM | POA: Diagnosis not present

## 2018-02-15 DIAGNOSIS — N401 Enlarged prostate with lower urinary tract symptoms: Secondary | ICD-10-CM | POA: Diagnosis not present

## 2018-02-15 DIAGNOSIS — F329 Major depressive disorder, single episode, unspecified: Secondary | ICD-10-CM | POA: Diagnosis not present

## 2018-02-15 DIAGNOSIS — E785 Hyperlipidemia, unspecified: Secondary | ICD-10-CM | POA: Diagnosis not present

## 2018-02-17 ENCOUNTER — Emergency Department (HOSPITAL_COMMUNITY)
Admission: EM | Admit: 2018-02-17 | Discharge: 2018-02-17 | Disposition: A | Discharge status: Home / Self Care | Payer: Medicare Other | Attending: Emergency Medicine | Admitting: Emergency Medicine

## 2018-02-17 ENCOUNTER — Other Ambulatory Visit: Payer: Self-pay

## 2018-02-17 DIAGNOSIS — R1030 Lower abdominal pain, unspecified: Secondary | ICD-10-CM | POA: Diagnosis not present

## 2018-02-17 DIAGNOSIS — G20 Parkinson's disease: Secondary | ICD-10-CM | POA: Diagnosis not present

## 2018-02-17 DIAGNOSIS — R339 Retention of urine, unspecified: Secondary | ICD-10-CM | POA: Diagnosis present

## 2018-02-17 DIAGNOSIS — R000 Tachycardia, unspecified: Secondary | ICD-10-CM | POA: Insufficient documentation

## 2018-02-17 DIAGNOSIS — T83091A Other mechanical complication of indwelling urethral catheter, initial encounter: Secondary | ICD-10-CM

## 2018-02-17 DIAGNOSIS — R140 Abdominal distension (gaseous): Secondary | ICD-10-CM | POA: Insufficient documentation

## 2018-02-17 DIAGNOSIS — R109 Unspecified abdominal pain: Secondary | ICD-10-CM | POA: Diagnosis not present

## 2018-02-17 DIAGNOSIS — Z79899 Other long term (current) drug therapy: Secondary | ICD-10-CM | POA: Diagnosis not present

## 2018-02-17 DIAGNOSIS — Y648 Contaminated medical or biological substance administered by other means: Secondary | ICD-10-CM | POA: Insufficient documentation

## 2018-02-17 DIAGNOSIS — T83098A Other mechanical complication of other indwelling urethral catheter, initial encounter: Secondary | ICD-10-CM | POA: Diagnosis not present

## 2018-02-17 LAB — URINALYSIS, ROUTINE W REFLEX MICROSCOPIC
BILIRUBIN URINE: NEGATIVE
Glucose, UA: NEGATIVE mg/dL
KETONES UR: NEGATIVE mg/dL
Nitrite: NEGATIVE
PROTEIN: 100 mg/dL — AB
Specific Gravity, Urine: 1.018 (ref 1.005–1.030)
WBC, UA: >50 WBC/hpf — ABNORMAL HIGH (ref 0–5)
pH: 5 (ref 5.0–8.0)

## 2018-02-17 LAB — CBC WITH DIFFERENTIAL/PLATELET
BASOS PCT: 0 %
Basophils Absolute: 0 10*3/uL (ref 0.0–0.1)
EOS ABS: 0.1 10*3/uL (ref 0.0–0.7)
EOS PCT: 1 %
HCT: 40.4 % (ref 39.0–52.0)
Hemoglobin: 13 g/dL (ref 13.0–17.0)
LYMPHS ABS: 1.2 10*3/uL (ref 0.7–4.0)
Lymphocytes Relative: 11 %
MCH: 27.6 pg (ref 26.0–34.0)
MCHC: 32.2 g/dL (ref 30.0–36.0)
MCV: 85.8 fL (ref 78.0–100.0)
Monocytes Absolute: 0.6 10*3/uL (ref 0.1–1.0)
Monocytes Relative: 6 %
Neutro Abs: 8.8 10*3/uL — ABNORMAL HIGH (ref 1.7–7.7)
Neutrophils Relative %: 82 %
PLATELETS: 253 10*3/uL (ref 150–400)
RBC: 4.71 MIL/uL (ref 4.22–5.81)
RDW: 13.8 % (ref 11.5–15.5)
WBC: 10.7 10*3/uL — AB (ref 4.0–10.5)

## 2018-02-17 LAB — BASIC METABOLIC PANEL
Anion gap: 11 (ref 5–15)
BUN: 18 mg/dL (ref 8–23)
CALCIUM: 9.3 mg/dL (ref 8.9–10.3)
CHLORIDE: 104 mmol/L (ref 98–111)
CO2: 26 mmol/L (ref 22–32)
CREATININE: 0.83 mg/dL (ref 0.61–1.24)
GFR calc non Af Amer: >60 mL/min (ref >60)
Glucose, Bld: 107 mg/dL — ABNORMAL HIGH (ref 70–99)
Potassium: 3.6 mmol/L (ref 3.5–5.1)
Sodium: 141 mmol/L (ref 135–145)

## 2018-02-17 NOTE — ED Triage Notes (Addendum)
Patient noticed this morning a decrease in his urine output. Patient had similar symptoms 1 week ago. Hx turp 4 weeks ago. Patient reports abdominal pain.

## 2018-02-17 NOTE — ED Notes (Signed)
RN Flushed Urethral Catheter with sterile saline 4 times. Cath is patent.

## 2018-02-17 NOTE — ED Provider Notes (Signed)
[facility] DEPT Provider Note   CSN: [id number] Arrival date & time: 02/17/18  1525     History   Chief Complaint Chief Complaint  Patient presents with  . Urinary Retention    HPI Kyle Ball is a 72 y.o. male.  HPI  72 year old male with a history of BPH, Parkinson's, and a Foley catheter for the past 4 weeks presents with recurrent urinary retention.  He had a TURP procedure about 4 weeks ago.  He was seen here on 9/2 for similar retention and had to have a new Foley placed.  Has been doing okay and had clear urine yesterday.  Since waking up this morning has had minimal to no output in his Foley catheter bag.  He last change the bag last night.  He is having significant lower abdominal pain.  No vomiting.  No fevers but is feeling flushed.  He is currently still on Cipro after being diagnosed with a UTI during the last ED visit.  Past Medical History:  Diagnosis Date  . Anxiety and depression   . BPH (benign prostatic hypertrophy)   . Crohn's disease ([place]) 1979   After resection, 1 flare in 2012.  Marland Kitchen Dysmetabolic syndrome X   . ED (erectile dysfunction)   . Hematuria   . Hyperlipidemia   . Internal hemorrhoids   . Other abnormalities of gait and mobility   . Parkinson's disease ([place])    [place]  . Repeated falls     Patient Active Problem List   Diagnosis Date Noted  . BPH with obstruction/lower urinary tract symptoms 01/23/2018  . Parkinson disease ([place]) 10/29/2017  . Acute lower UTI 10/27/2017  . UTI (urinary tract infection) 10/27/2017  . Medicare annual wellness visit, subsequent 12/04/2016  . Obesity 12/04/2016  . Hyperglycemia 02/04/2015  . Elevated blood pressure reading without diagnosis of hypertension 03/05/2013  . Nonspecific abnormal electrocardiogram (ECG) (EKG) 08/31/2011  . Parkinson's disease ([place]) 06/21/2010  . ANXIETY 07/01/2007  . BPH (benign prostatic hyperplasia) 07/01/2007  . HYPERLIPIDEMIA 04/10/2007  .  DISORDER, DYSMETABOLIC SYNDROME X 95/63/8756  . DISORDER, DEPRESSIVE NEC 04/10/2007  . ELEVATED PROSTATE SPECIFIC ANTIGEN 04/10/2007  . History of non anemic vitamin B12 deficiency 10/26/2006  . ERECTILE DYSFUNCTION 10/26/2006  . Crohn's disease of small intestine with intestinal obstruction ([place]) 10/26/2006    Past Surgical History:  Procedure Laterality Date  . COLONOSCOPY W/ BIOPSIES   06/2007, 2015   Crohn's, internal hemorrhoids, Dr Carlean Purl  . KNEE ARTHROSCOPY Right 06/2013  . PROSTATE BIOPSY     X 2, Dr Risa Grill  . PTNS Treatment     urinary incontinence  . QUADRICEPS REPAIR Right 1966    knee  . REFRACTIVE SURGERY  2015   Dr Bing Plume  . Terminal Ileum & Appendix Resected  1995   Crohn's  . TRANSURETHRAL RESECTION OF PROSTATE N/A 01/23/2018   Procedure: TRANSURETHRAL RESECTION OF THE PROSTATE (TURP);  Surgeon: Ardis Hughs, MD;  Location: [facility];  Service: Urology;  Laterality: N/A;        Home Medications    Prior to Admission medications   Medication Sig Start Date End Date Taking? Authorizing Provider  atorvastatin (LIPITOR) 20 MG tablet Take 1 tablet (20 mg total) daily by mouth. Needs to establish with new PCP for more refills. Patient taking differently: Take 20 mg by mouth at bedtime.  04/23/17   Lance Sell, NP  budesonide (ENTOCORT EC) 3 MG 24 hr capsule Take 3 capsules (9  mg total) by mouth daily. 11/08/17   Gatha Mayer, MD  carbidopa-levodopa (SINEMET IR) 25-250 MG tablet Take 1 tablet by mouth 3 (three) times daily. 10/17/17   [provider]  ciprofloxacin (CIPRO) 500 MG tablet Take 1 tablet (500 mg total) by mouth 2 (two) times daily for 7 days. 02/11/18 02/18/18  Maudie Flakes, MD  citalopram (CELEXA) 20 MG tablet Take 30 mg by mouth daily.     [provider]  docusate sodium (COLACE) 100 MG capsule Take 1 capsule (100 mg total) by mouth 2 (two) times daily as needed for mild constipation. 01/24/18 01/24/19  Ardis Hughs, MD    dutasteride (AVODART) 0.5 MG capsule Take 0.5 mg by mouth daily. 01/23/18   [provider]  tamsulosin (FLOMAX) 0.4 MG CAPS capsule Take 0.4 mg by mouth at bedtime. 01/24/18   [provider]  traMADol (ULTRAM) 50 MG tablet Take 1-2 tablets (50-100 mg total) by mouth every 6 (six) hours as needed for moderate pain. 01/24/18   Ardis Hughs, MD    Family History Family History  Problem Relation Age of Onset  . Uterine cancer Mother        metastatic to liver  . Hypertension Father   . Diabetes Father   . Stroke Father 76  . Heart attack Father 36  . Testicular cancer Brother 77  . Fibromyalgia Sister   . Mental illness Sister        chronic depression  . Colon cancer Neg Hx   . Colon polyps Neg Hx   . Rectal cancer Neg Hx   . Stomach cancer Neg Hx     Social History Social History   Tobacco Use  . Smoking status: Never Smoker  . Smokeless tobacco: Never Used  Substance Use Topics  . Alcohol use: Yes    Alcohol/week: 14.0 standard drinks    Types: 14 Glasses of wine per week  . Drug use: No     Allergies   Penicillins   Review of Systems Review of Systems  Constitutional: Negative for fever.  Gastrointestinal: Positive for abdominal pain. Negative for vomiting.  Genitourinary: Positive for decreased urine volume and difficulty urinating.  All other systems reviewed and are negative.    Physical Exam Updated Vital Signs BP 134/70 (BP Location: Left Arm)   Pulse 69   Temp 98.2 F (36.8 C) (Oral)   Resp 18   Ht 6\' 2"  (1.88 m)   Wt 102.1 kg   SpO2 100%   BMI 28.89 kg/m   Physical Exam  Constitutional: He is oriented to person, place, and time. He appears well-developed and well-nourished.  HENT:  Head: Normocephalic and atraumatic.  Right Ear: External ear normal.  Left Ear: External ear normal.  Nose: Nose normal.  Eyes: Right eye exhibits no discharge. Left eye exhibits no discharge.  Neck: Neck supple.  Cardiovascular:  Regular rhythm and normal heart sounds. Tachycardia present.  HR~100  Pulmonary/Chest: Effort normal and breath sounds normal.  Abdominal: Soft. He exhibits distension. There is tenderness in the suprapubic area.  Suprapubic distention and tenderness c/w urinary bladder retention.  Genitourinary:  Genitourinary Comments: Foley catheter in place. Small amount of blood around catheter at insertion site. Less than 200cc urine in catheter bag  Musculoskeletal: He exhibits no edema.  Neurological: He is alert and oriented to person, place, and time. He displays tremor.  Skin: Skin is warm and dry. He is not diaphoretic.  Nursing note and vitals  reviewed.    ED Treatments / Results  Labs (all labs ordered are listed, but only abnormal results are displayed) Labs Reviewed  URINALYSIS, ROUTINE W REFLEX MICROSCOPIC - Abnormal; Notable for the following components:      Result Value   APPearance CLOUDY (*)    Hgb urine dipstick LARGE (*)    Protein, ur 100 (*)    Leukocytes, UA LARGE (*)    RBC / HPF >50 (*)    WBC, UA >50 (*)    Bacteria, UA RARE (*)    All other components within normal limits  BASIC METABOLIC PANEL - Abnormal; Notable for the following components:   Glucose, Bld 107 (*)    All other components within normal limits  CBC WITH DIFFERENTIAL/PLATELET - Abnormal; Notable for the following components:   WBC 10.7 (*)    Neutro Abs 8.8 (*)    All other components within normal limits  URINE CULTURE    EKG None  Radiology No results found.  Procedures Procedures (including critical care time)  Medications Ordered in ED Medications - No data to display   Initial Impression / Assessment and Plan / ED Course  I have reviewed the triage vital signs and the nursing notes.  Pertinent labs & imaging results that were available during my care of the patient were reviewed by me and considered in my medical decision making (see chart for details).     Catheter was  irrigated but unable to relieve the obstruction.  Thus a new Foley catheter was placed.  Upon the first catheter being removed, small amount of blood came out which was likely causing the obstruction.  Now with new Foley catheter he is draining appropriately and his symptoms have all resolved.  He is currently on antibiotics and so I do not think this needs to be changed.  Lab work is reassuring including normal renal function.  He is following up with his urologist in 2 days.  Keep this appointment and return if symptoms worsen.  Final Clinical Impressions(s) / ED Diagnoses   Final diagnoses:  Obstruction of Foley catheter, initial encounter [facility])    ED Discharge Orders    None       Sherwood Gambler, MD 02/17/18 2330

## 2018-02-18 DIAGNOSIS — Z87440 Personal history of urinary (tract) infections: Secondary | ICD-10-CM | POA: Diagnosis not present

## 2018-02-18 DIAGNOSIS — G20 Parkinson's disease: Secondary | ICD-10-CM | POA: Diagnosis not present

## 2018-02-18 DIAGNOSIS — K50012 Crohn's disease of small intestine with intestinal obstruction: Secondary | ICD-10-CM | POA: Diagnosis not present

## 2018-02-18 DIAGNOSIS — E785 Hyperlipidemia, unspecified: Secondary | ICD-10-CM | POA: Diagnosis not present

## 2018-02-18 DIAGNOSIS — N401 Enlarged prostate with lower urinary tract symptoms: Secondary | ICD-10-CM | POA: Diagnosis not present

## 2018-02-18 DIAGNOSIS — F329 Major depressive disorder, single episode, unspecified: Secondary | ICD-10-CM | POA: Diagnosis not present

## 2018-02-19 DIAGNOSIS — N401 Enlarged prostate with lower urinary tract symptoms: Secondary | ICD-10-CM | POA: Diagnosis not present

## 2018-02-19 DIAGNOSIS — K50012 Crohn's disease of small intestine with intestinal obstruction: Secondary | ICD-10-CM | POA: Diagnosis not present

## 2018-02-19 DIAGNOSIS — G20 Parkinson's disease: Secondary | ICD-10-CM | POA: Diagnosis not present

## 2018-02-19 DIAGNOSIS — F329 Major depressive disorder, single episode, unspecified: Secondary | ICD-10-CM | POA: Diagnosis not present

## 2018-02-19 DIAGNOSIS — E785 Hyperlipidemia, unspecified: Secondary | ICD-10-CM | POA: Diagnosis not present

## 2018-02-19 DIAGNOSIS — Z87440 Personal history of urinary (tract) infections: Secondary | ICD-10-CM | POA: Diagnosis not present

## 2018-02-19 LAB — URINE CULTURE

## 2018-02-19 NOTE — Telephone Encounter (Signed)
Gave verbal orders for pt per dr Volanda Napoleon

## 2018-02-21 DIAGNOSIS — R338 Other retention of urine: Secondary | ICD-10-CM | POA: Diagnosis not present

## 2018-02-22 DIAGNOSIS — K50012 Crohn's disease of small intestine with intestinal obstruction: Secondary | ICD-10-CM | POA: Diagnosis not present

## 2018-02-22 DIAGNOSIS — F329 Major depressive disorder, single episode, unspecified: Secondary | ICD-10-CM | POA: Diagnosis not present

## 2018-02-22 DIAGNOSIS — N401 Enlarged prostate with lower urinary tract symptoms: Secondary | ICD-10-CM | POA: Diagnosis not present

## 2018-02-22 DIAGNOSIS — E785 Hyperlipidemia, unspecified: Secondary | ICD-10-CM | POA: Diagnosis not present

## 2018-02-22 DIAGNOSIS — Z87440 Personal history of urinary (tract) infections: Secondary | ICD-10-CM | POA: Diagnosis not present

## 2018-02-22 DIAGNOSIS — G20 Parkinson's disease: Secondary | ICD-10-CM | POA: Diagnosis not present

## 2018-02-25 ENCOUNTER — Telehealth: Payer: Self-pay | Admitting: Family Medicine

## 2018-02-25 NOTE — Telephone Encounter (Signed)
Spoke with April gave VO for recertification on PT/Skilled nursing

## 2018-02-25 NOTE — Telephone Encounter (Signed)
Copied from [place] [id number]. Topic: Quick Communication - See Telephone Encounter >> Feb 25, 2018 11:48 AM Gardiner Ramus wrote: CRM for notification. See Telephone encounter for: 02/25/18. April calling from [place] @ home called to get a verbal order recertification 8/37/54 for PT and skilled nursing

## 2018-02-26 DIAGNOSIS — R338 Other retention of urine: Secondary | ICD-10-CM | POA: Diagnosis not present

## 2018-02-26 DIAGNOSIS — F419 Anxiety disorder, unspecified: Secondary | ICD-10-CM | POA: Diagnosis not present

## 2018-02-26 DIAGNOSIS — Z466 Encounter for fitting and adjustment of urinary device: Secondary | ICD-10-CM | POA: Diagnosis not present

## 2018-02-26 DIAGNOSIS — E785 Hyperlipidemia, unspecified: Secondary | ICD-10-CM | POA: Diagnosis not present

## 2018-02-26 DIAGNOSIS — K50012 Crohn's disease of small intestine with intestinal obstruction: Secondary | ICD-10-CM | POA: Diagnosis not present

## 2018-02-26 DIAGNOSIS — Z9181 History of falling: Secondary | ICD-10-CM | POA: Diagnosis not present

## 2018-02-26 DIAGNOSIS — N401 Enlarged prostate with lower urinary tract symptoms: Secondary | ICD-10-CM | POA: Diagnosis not present

## 2018-02-26 DIAGNOSIS — G20 Parkinson's disease: Secondary | ICD-10-CM | POA: Diagnosis not present

## 2018-02-26 DIAGNOSIS — F329 Major depressive disorder, single episode, unspecified: Secondary | ICD-10-CM | POA: Diagnosis not present

## 2018-02-26 DIAGNOSIS — Z87440 Personal history of urinary (tract) infections: Secondary | ICD-10-CM | POA: Diagnosis not present

## 2018-03-05 ENCOUNTER — Encounter: Payer: Self-pay | Admitting: Internal Medicine

## 2018-03-05 ENCOUNTER — Ambulatory Visit (INDEPENDENT_AMBULATORY_CARE_PROVIDER_SITE_OTHER): Payer: Medicare Other | Admitting: Internal Medicine

## 2018-03-05 DIAGNOSIS — R338 Other retention of urine: Secondary | ICD-10-CM | POA: Diagnosis not present

## 2018-03-05 DIAGNOSIS — K50012 Crohn's disease of small intestine with intestinal obstruction: Secondary | ICD-10-CM

## 2018-03-05 DIAGNOSIS — K862 Cyst of pancreas: Secondary | ICD-10-CM

## 2018-03-05 DIAGNOSIS — N401 Enlarged prostate with lower urinary tract symptoms: Secondary | ICD-10-CM | POA: Diagnosis not present

## 2018-03-05 DIAGNOSIS — G20 Parkinson's disease: Secondary | ICD-10-CM | POA: Diagnosis not present

## 2018-03-05 DIAGNOSIS — F419 Anxiety disorder, unspecified: Secondary | ICD-10-CM | POA: Diagnosis not present

## 2018-03-05 DIAGNOSIS — F329 Major depressive disorder, single episode, unspecified: Secondary | ICD-10-CM | POA: Diagnosis not present

## 2018-03-05 NOTE — Assessment & Plan Note (Signed)
Improved Continue budesonide 9 mg daily Side effects discussed

## 2018-03-05 NOTE — Assessment & Plan Note (Signed)
CT pancreas protocol November I do not think he can hold breath for MR

## 2018-03-05 NOTE — Patient Instructions (Signed)
  Stay on your budesonide per Dr Carlean Purl.   Dr Celesta Aver nurse Barbera Setters will contact you to set up a November CT scan.    I appreciate the opportunity to care for you. Silvano Rusk, MD, [facility]

## 2018-03-05 NOTE — Progress Notes (Signed)
Kyle Ball 72 y.o. 08-07-1945 [id number]  Assessment & Plan:  Crohn's disease of small intestine with intestinal obstruction (HCC) Improved Continue budesonide 9 mg daily Side effects discussed    Cyst of pancreas CT pancreas protocol November I do not think he can hold breath for MR   I appreciate the opportunity to care for this patient. CC: [name], MD Dr. Louis Meckel   Subjective:   Chief Complaint: Follow-up of Crohn's disease  HPI Kyle Ball is here today, he has had difficulty with urinary obstruction and ended up with a TURP on August 14 but still has a Foley in because he is "slow to heal".  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  He is in a rehab facility at this point.  Using a walker to ambulate.  As part of 1 of his work-ups in the spring he had a CT scan of the abdomen and pelvis and there was a very small 2 cm maximal cyst in the pancreas.  In the body.  This was not noted on a CT enterography in 2012. Allergies  Allergen Reactions  . Penicillins     CHILDHOOD ALLERGY Has patient had a PCN reaction causing immediate rash, facial/tongue/throat swelling, SOB or lightheadedness with hypotension: Yes Has patient had a PCN reaction causing severe rash involving mucus membranes or skin necrosis: No Has patient had a PCN reaction that required hospitalization: No Has patient had a PCN reaction occurring within the last 10 years:No If all of the above answers are "NO", then may proceed with Cephalosporin use.    Current Meds  Medication Sig  . atorvastatin (LIPITOR) 20 MG tablet Take 1 tablet (20 mg total) daily by mouth. Needs to establish with new PCP for more refills. (Patient taking differently: Take 20 mg by mouth at bedtime. )  . budesonide (ENTOCORT EC) 3 MG 24 hr  capsule Take 3 capsules (9 mg total) by mouth daily.  . carbidopa-levodopa (SINEMET IR) 25-250 MG tablet Take 1 tablet by mouth 3 (three) times daily.  . citalopram (CELEXA) 20 MG tablet Take 30 mg by mouth daily.   Marland Kitchen dutasteride (AVODART) 0.5 MG capsule Take 0.5 mg by mouth daily.  . tamsulosin (FLOMAX) 0.4 MG CAPS capsule Take 0.4 mg by mouth at bedtime.  . [DISCONTINUED] traMADol (ULTRAM) 50 MG tablet Take 1-2 tablets (50-100 mg total) by mouth every 6 (six) hours as needed for moderate pain.   Past Medical History:  Diagnosis Date  . Anxiety and depression   . BPH (benign prostatic hypertrophy)   . Crohn's disease ([place]) 1979   After resection, 1 flare in 2012.  Marland Kitchen Dysmetabolic syndrome X   . ED (erectile dysfunction)   . Hematuria   . Hyperlipidemia   . Internal hemorrhoids   . Other abnormalities of gait and mobility   . Parkinson's disease ([place])    [place]  . Repeated falls    Past Surgical History:  Procedure Laterality Date  . COLONOSCOPY W/ BIOPSIES   06/2007, 2015   Crohn's, internal hemorrhoids, Dr Carlean Purl  . KNEE ARTHROSCOPY Right 06/2013  . PROSTATE BIOPSY     X 2, Dr Risa Grill  . PTNS Treatment     urinary incontinence  . QUADRICEPS REPAIR Right 1966    knee  . REFRACTIVE SURGERY  2015  Dr Bing Plume  . Terminal Ileum & Appendix Resected  1995   Crohn's  . TRANSURETHRAL RESECTION OF PROSTATE N/A 01/23/2018   Procedure: TRANSURETHRAL RESECTION OF THE PROSTATE (TURP);  Surgeon: Ardis Hughs, MD;  Location: [facility];  Service: Urology;  Laterality: N/A;   Social History   Social History Narrative   Fun/Hobby: Working around the house and fixing things    family history includes Diabetes in his father; Fibromyalgia in his sister; Heart attack (age of onset: 55) in his father; Hypertension in his father; Mental illness in his sister; Stroke (age of onset: 61) in his father; Testicular cancer (age of onset: 40) in his brother; Uterine cancer in his mother.   Review of  Systems As per HPI  Objective:   Physical Exam BP 132/84   Pulse 72   Ht 6\' 2"  (1.88 m)   Wt 228 lb 2 oz (103.5 kg)   BMI 29.29 kg/m  Mildly chronically ill Masked facies and bradykinesia consistent with known Parkinson's Lungs clear Heart sounds are normal abdomen nontender Foley catheter in place Speech is slow but clear and he answers questions appropriately though he has to think for a minute before answering  Lab Results  Component Value Date   WBC 10.7 (H) 02/17/2018   HGB 13.0 02/17/2018   HCT 40.4 02/17/2018   MCV 85.8 02/17/2018   PLT 253 02/17/2018

## 2018-03-06 ENCOUNTER — Telehealth: Payer: Self-pay | Admitting: Internal Medicine

## 2018-03-06 ENCOUNTER — Encounter: Payer: Self-pay | Admitting: Internal Medicine

## 2018-03-06 NOTE — Telephone Encounter (Signed)
History updated

## 2018-03-07 DIAGNOSIS — R338 Other retention of urine: Secondary | ICD-10-CM | POA: Diagnosis not present

## 2018-03-07 DIAGNOSIS — G20 Parkinson's disease: Secondary | ICD-10-CM | POA: Diagnosis not present

## 2018-03-07 DIAGNOSIS — F329 Major depressive disorder, single episode, unspecified: Secondary | ICD-10-CM | POA: Diagnosis not present

## 2018-03-07 DIAGNOSIS — F419 Anxiety disorder, unspecified: Secondary | ICD-10-CM | POA: Diagnosis not present

## 2018-03-07 DIAGNOSIS — K50012 Crohn's disease of small intestine with intestinal obstruction: Secondary | ICD-10-CM | POA: Diagnosis not present

## 2018-03-07 DIAGNOSIS — N401 Enlarged prostate with lower urinary tract symptoms: Secondary | ICD-10-CM | POA: Diagnosis not present

## 2018-03-13 DIAGNOSIS — H2511 Age-related nuclear cataract, right eye: Secondary | ICD-10-CM | POA: Diagnosis not present

## 2018-03-13 DIAGNOSIS — H524 Presbyopia: Secondary | ICD-10-CM | POA: Diagnosis not present

## 2018-03-13 DIAGNOSIS — D4981 Neoplasm of unspecified behavior of retina and choroid: Secondary | ICD-10-CM | POA: Diagnosis not present

## 2018-03-13 DIAGNOSIS — H26492 Other secondary cataract, left eye: Secondary | ICD-10-CM | POA: Diagnosis not present

## 2018-03-14 DIAGNOSIS — G20 Parkinson's disease: Secondary | ICD-10-CM | POA: Diagnosis not present

## 2018-03-14 DIAGNOSIS — K50012 Crohn's disease of small intestine with intestinal obstruction: Secondary | ICD-10-CM | POA: Diagnosis not present

## 2018-03-14 DIAGNOSIS — F419 Anxiety disorder, unspecified: Secondary | ICD-10-CM | POA: Diagnosis not present

## 2018-03-14 DIAGNOSIS — N401 Enlarged prostate with lower urinary tract symptoms: Secondary | ICD-10-CM | POA: Diagnosis not present

## 2018-03-14 DIAGNOSIS — F329 Major depressive disorder, single episode, unspecified: Secondary | ICD-10-CM | POA: Diagnosis not present

## 2018-03-14 DIAGNOSIS — R338 Other retention of urine: Secondary | ICD-10-CM | POA: Diagnosis not present

## 2018-03-19 DIAGNOSIS — N401 Enlarged prostate with lower urinary tract symptoms: Secondary | ICD-10-CM | POA: Diagnosis not present

## 2018-03-19 DIAGNOSIS — R338 Other retention of urine: Secondary | ICD-10-CM | POA: Diagnosis not present

## 2018-03-19 DIAGNOSIS — K50012 Crohn's disease of small intestine with intestinal obstruction: Secondary | ICD-10-CM | POA: Diagnosis not present

## 2018-03-19 DIAGNOSIS — G20 Parkinson's disease: Secondary | ICD-10-CM | POA: Diagnosis not present

## 2018-03-19 DIAGNOSIS — F419 Anxiety disorder, unspecified: Secondary | ICD-10-CM | POA: Diagnosis not present

## 2018-03-19 DIAGNOSIS — F329 Major depressive disorder, single episode, unspecified: Secondary | ICD-10-CM | POA: Diagnosis not present

## 2018-04-12 ENCOUNTER — Telehealth: Payer: Self-pay

## 2018-04-12 NOTE — Telephone Encounter (Signed)
I spoke with the patient and he will call me back on Monday with his schedule

## 2018-04-12 NOTE — Telephone Encounter (Signed)
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

## 2018-04-17 ENCOUNTER — Other Ambulatory Visit: Payer: Self-pay

## 2018-04-17 DIAGNOSIS — Z79899 Other long term (current) drug therapy: Secondary | ICD-10-CM | POA: Diagnosis not present

## 2018-04-17 DIAGNOSIS — G20 Parkinson's disease: Secondary | ICD-10-CM | POA: Diagnosis not present

## 2018-04-17 DIAGNOSIS — K862 Cyst of pancreas: Secondary | ICD-10-CM

## 2018-04-17 NOTE — Progress Notes (Addendum)
CT abdomen scheduled on 04/26/18 arrival at 10:15 am for 10:30 am appt at [facility]; pt is to be NPO for 4 hours prior to CT scan; pt also needs to have lab work ([place]) prior to day of scan;  Letter was sent to patient with recommendations and notifies patient of above notation.

## 2018-04-17 NOTE — Telephone Encounter (Signed)
Please see additional documentation for previous message;

## 2018-04-23 ENCOUNTER — Other Ambulatory Visit (INDEPENDENT_AMBULATORY_CARE_PROVIDER_SITE_OTHER): Payer: Medicare Other

## 2018-04-23 DIAGNOSIS — R338 Other retention of urine: Secondary | ICD-10-CM | POA: Diagnosis not present

## 2018-04-23 DIAGNOSIS — K862 Cyst of pancreas: Secondary | ICD-10-CM

## 2018-04-23 DIAGNOSIS — N400 Enlarged prostate without lower urinary tract symptoms: Secondary | ICD-10-CM | POA: Diagnosis not present

## 2018-04-23 LAB — COMPREHENSIVE METABOLIC PANEL
ALK PHOS: 74 U/L (ref 39–117)
ALT: 4 U/L (ref 0–53)
AST: 10 U/L (ref 0–37)
Albumin: 4.1 g/dL (ref 3.5–5.2)
BILIRUBIN TOTAL: 0.5 mg/dL (ref 0.2–1.2)
BUN: 14 mg/dL (ref 6–23)
CO2: 31 meq/L (ref 19–32)
Calcium: 9.3 mg/dL (ref 8.4–10.5)
Chloride: 102 mEq/L (ref 96–112)
Creatinine, Ser: 0.77 mg/dL (ref 0.40–1.50)
GFR: 106.06 mL/min (ref >60.00)
GLUCOSE: 96 mg/dL (ref 70–99)
Potassium: 3.6 mEq/L (ref 3.5–5.1)
SODIUM: 141 meq/L (ref 135–145)
TOTAL PROTEIN: 6.7 g/dL (ref 6.0–8.3)

## 2018-04-26 ENCOUNTER — Ambulatory Visit (HOSPITAL_COMMUNITY)
Admission: RE | Admit: 2018-04-26 | Discharge: 2018-04-26 | Disposition: A | Discharge status: Home / Self Care | Payer: Medicare Other | Source: Ambulatory Visit | Attending: Internal Medicine | Admitting: Internal Medicine

## 2018-04-26 ENCOUNTER — Encounter (HOSPITAL_COMMUNITY): Payer: Self-pay

## 2018-04-26 DIAGNOSIS — K862 Cyst of pancreas: Secondary | ICD-10-CM | POA: Insufficient documentation

## 2018-04-26 DIAGNOSIS — I700 Atherosclerosis of aorta: Secondary | ICD-10-CM | POA: Diagnosis not present

## 2018-04-26 DIAGNOSIS — R911 Solitary pulmonary nodule: Secondary | ICD-10-CM | POA: Diagnosis not present

## 2018-04-26 MED ORDER — SODIUM CHLORIDE (PF) 0.9 % IJ SOLN
INTRAMUSCULAR | Status: AC
  Filled 2018-04-26: qty 50

## 2018-04-26 MED ORDER — IOHEXOL 300 MG/ML  SOLN
100.0000 mL | Freq: Once | INTRAMUSCULAR | Status: AC | PRN
  Administered 2018-04-26: 100 mL via INTRAVENOUS

## 2018-04-30 NOTE — Progress Notes (Signed)
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

## 2018-05-03 ENCOUNTER — Other Ambulatory Visit: Payer: Self-pay | Admitting: Internal Medicine

## 2018-05-03 NOTE — Telephone Encounter (Signed)
How many refills Sir? 

## 2018-05-20 ENCOUNTER — Telehealth: Payer: Self-pay

## 2018-05-20 NOTE — Telephone Encounter (Signed)
Received fax request from [facility] for a PA for generic budesonide. Filled out form for PA and faxed back. Voicemail left for our office stating patient's budesonide was approved until next year and the will notify patient of approval.

## 2018-05-27 ENCOUNTER — Telehealth: Payer: Self-pay | Admitting: Internal Medicine

## 2018-05-29 DIAGNOSIS — R350 Frequency of micturition: Secondary | ICD-10-CM | POA: Diagnosis not present

## 2018-05-29 DIAGNOSIS — N400 Enlarged prostate without lower urinary tract symptoms: Secondary | ICD-10-CM | POA: Diagnosis not present

## 2018-05-29 NOTE — Telephone Encounter (Signed)
Why is he getting 10 day supplies - should be 30 or 90?

## 2018-05-30 NOTE — Telephone Encounter (Signed)
Tried to call Buzz and his phone goes to voicemail and its full and unable to accept new messages. I will try him later.

## 2018-05-30 NOTE — Telephone Encounter (Signed)
Patient doesn't need the medicine refilled at this time he just wanted you to know how it panned out for him.

## 2018-05-30 NOTE — Telephone Encounter (Signed)
OK  Can refill for 6 months

## 2018-05-30 NOTE — Telephone Encounter (Signed)
I spoke with Kyle Ball and he was only getting 10 days worth at a time because of the cost. He called his insurance company and complained and they lowered the tier to tier one so now he can get 30 days worth for $3.00. He just wanted Dr Carlean Purl to know.

## 2018-07-09 ENCOUNTER — Ambulatory Visit: Payer: Medicare Other | Admitting: Physical Therapy

## 2018-10-10 DIAGNOSIS — R350 Frequency of micturition: Secondary | ICD-10-CM | POA: Diagnosis not present

## 2018-10-10 DIAGNOSIS — M545 Low back pain: Secondary | ICD-10-CM | POA: Diagnosis not present

## 2018-10-10 DIAGNOSIS — N401 Enlarged prostate with lower urinary tract symptoms: Secondary | ICD-10-CM | POA: Diagnosis not present

## 2018-10-11 ENCOUNTER — Other Ambulatory Visit: Payer: Self-pay

## 2018-10-11 ENCOUNTER — Encounter (HOSPITAL_COMMUNITY): Payer: Self-pay | Admitting: *Deleted

## 2018-10-11 ENCOUNTER — Emergency Department (HOSPITAL_COMMUNITY): Payer: Medicare Other

## 2018-10-11 ENCOUNTER — Inpatient Hospital Stay (HOSPITAL_COMMUNITY)
Admission: EM | Admit: 2018-10-11 | Discharge: 2018-11-11 | DRG: 565 | Disposition: E | Payer: Medicare Other | Attending: Internal Medicine | Admitting: Internal Medicine

## 2018-10-11 DIAGNOSIS — K5090 Crohn's disease, unspecified, without complications: Secondary | ICD-10-CM | POA: Diagnosis present

## 2018-10-11 DIAGNOSIS — Z87440 Personal history of urinary (tract) infections: Secondary | ICD-10-CM

## 2018-10-11 DIAGNOSIS — N390 Urinary tract infection, site not specified: Secondary | ICD-10-CM | POA: Diagnosis present

## 2018-10-11 DIAGNOSIS — S59902A Unspecified injury of left elbow, initial encounter: Secondary | ICD-10-CM | POA: Diagnosis not present

## 2018-10-11 DIAGNOSIS — Z9079 Acquired absence of other genital organ(s): Secondary | ICD-10-CM

## 2018-10-11 DIAGNOSIS — W08XXXA Fall from other furniture, initial encounter: Secondary | ICD-10-CM | POA: Diagnosis present

## 2018-10-11 DIAGNOSIS — R0902 Hypoxemia: Secondary | ICD-10-CM | POA: Diagnosis not present

## 2018-10-11 DIAGNOSIS — G20A1 Parkinson's disease without dyskinesia, without mention of fluctuations: Secondary | ICD-10-CM | POA: Diagnosis present

## 2018-10-11 DIAGNOSIS — M25522 Pain in left elbow: Secondary | ICD-10-CM | POA: Diagnosis present

## 2018-10-11 DIAGNOSIS — I469 Cardiac arrest, cause unspecified: Secondary | ICD-10-CM | POA: Diagnosis not present

## 2018-10-11 DIAGNOSIS — T796XXA Traumatic ischemia of muscle, initial encounter: Secondary | ICD-10-CM | POA: Diagnosis not present

## 2018-10-11 DIAGNOSIS — N401 Enlarged prostate with lower urinary tract symptoms: Secondary | ICD-10-CM | POA: Diagnosis present

## 2018-10-11 DIAGNOSIS — R000 Tachycardia, unspecified: Secondary | ICD-10-CM | POA: Diagnosis not present

## 2018-10-11 DIAGNOSIS — D696 Thrombocytopenia, unspecified: Secondary | ICD-10-CM | POA: Diagnosis present

## 2018-10-11 DIAGNOSIS — E669 Obesity, unspecified: Secondary | ICD-10-CM | POA: Diagnosis present

## 2018-10-11 DIAGNOSIS — Z8249 Family history of ischemic heart disease and other diseases of the circulatory system: Secondary | ICD-10-CM

## 2018-10-11 DIAGNOSIS — Z66 Do not resuscitate: Secondary | ICD-10-CM | POA: Diagnosis present

## 2018-10-11 DIAGNOSIS — Y92018 Other place in single-family (private) house as the place of occurrence of the external cause: Secondary | ICD-10-CM

## 2018-10-11 DIAGNOSIS — N138 Other obstructive and reflux uropathy: Secondary | ICD-10-CM | POA: Diagnosis not present

## 2018-10-11 DIAGNOSIS — F411 Generalized anxiety disorder: Secondary | ICD-10-CM | POA: Diagnosis not present

## 2018-10-11 DIAGNOSIS — R531 Weakness: Secondary | ICD-10-CM

## 2018-10-11 DIAGNOSIS — N400 Enlarged prostate without lower urinary tract symptoms: Secondary | ICD-10-CM | POA: Diagnosis present

## 2018-10-11 DIAGNOSIS — Z20828 Contact with and (suspected) exposure to other viral communicable diseases: Secondary | ICD-10-CM | POA: Diagnosis present

## 2018-10-11 DIAGNOSIS — I4510 Unspecified right bundle-branch block: Secondary | ICD-10-CM | POA: Diagnosis not present

## 2018-10-11 DIAGNOSIS — E876 Hypokalemia: Secondary | ICD-10-CM | POA: Diagnosis not present

## 2018-10-11 DIAGNOSIS — Z800 Family history of malignant neoplasm of digestive organs: Secondary | ICD-10-CM

## 2018-10-11 DIAGNOSIS — R5381 Other malaise: Secondary | ICD-10-CM | POA: Diagnosis present

## 2018-10-11 DIAGNOSIS — Z8043 Family history of malignant neoplasm of testis: Secondary | ICD-10-CM

## 2018-10-11 DIAGNOSIS — G20 Parkinson's disease: Secondary | ICD-10-CM | POA: Diagnosis present

## 2018-10-11 DIAGNOSIS — S0990XA Unspecified injury of head, initial encounter: Secondary | ICD-10-CM | POA: Diagnosis not present

## 2018-10-11 DIAGNOSIS — M6281 Muscle weakness (generalized): Secondary | ICD-10-CM | POA: Diagnosis not present

## 2018-10-11 DIAGNOSIS — Z7952 Long term (current) use of systemic steroids: Secondary | ICD-10-CM

## 2018-10-11 DIAGNOSIS — M6282 Rhabdomyolysis: Secondary | ICD-10-CM | POA: Diagnosis present

## 2018-10-11 DIAGNOSIS — R0689 Other abnormalities of breathing: Secondary | ICD-10-CM | POA: Diagnosis not present

## 2018-10-11 DIAGNOSIS — W19XXXA Unspecified fall, initial encounter: Secondary | ICD-10-CM | POA: Diagnosis not present

## 2018-10-11 DIAGNOSIS — Y92009 Unspecified place in unspecified non-institutional (private) residence as the place of occurrence of the external cause: Secondary | ICD-10-CM

## 2018-10-11 DIAGNOSIS — Z818 Family history of other mental and behavioral disorders: Secondary | ICD-10-CM

## 2018-10-11 DIAGNOSIS — Z8049 Family history of malignant neoplasm of other genital organs: Secondary | ICD-10-CM

## 2018-10-11 DIAGNOSIS — I4891 Unspecified atrial fibrillation: Secondary | ICD-10-CM | POA: Diagnosis not present

## 2018-10-11 LAB — URINALYSIS, ROUTINE W REFLEX MICROSCOPIC
Bilirubin Urine: NEGATIVE
Glucose, UA: NEGATIVE mg/dL
Ketones, ur: 80 mg/dL — AB
Leukocytes,Ua: NEGATIVE
Nitrite: NEGATIVE
Protein, ur: NEGATIVE mg/dL
Specific Gravity, Urine: 1.018 (ref 1.005–1.030)
pH: 5 (ref 5.0–8.0)

## 2018-10-11 LAB — CBC WITH DIFFERENTIAL/PLATELET
Abs Immature Granulocytes: 0.06 10*3/uL (ref 0.00–0.07)
Basophils Absolute: 0 10*3/uL (ref 0.0–0.1)
Basophils Relative: 0 %
Eosinophils Absolute: 0 10*3/uL (ref 0.0–0.5)
Eosinophils Relative: 0 %
HCT: 49.3 % (ref 39.0–52.0)
Hemoglobin: 16 g/dL (ref 13.0–17.0)
Immature Granulocytes: 1 %
Lymphocytes Relative: 6 %
Lymphs Abs: 0.6 10*3/uL — ABNORMAL LOW (ref 0.7–4.0)
MCH: 28.4 pg (ref 26.0–34.0)
MCHC: 32.5 g/dL (ref 30.0–36.0)
MCV: 87.6 fL (ref 80.0–100.0)
Monocytes Absolute: 0.7 10*3/uL (ref 0.1–1.0)
Monocytes Relative: 7 %
Neutro Abs: 7.7 10*3/uL (ref 1.7–7.7)
Neutrophils Relative %: 86 %
Platelets: 148 10*3/uL — ABNORMAL LOW (ref 150–400)
RBC: 5.63 MIL/uL (ref 4.22–5.81)
RDW: 13.2 % (ref 11.5–15.5)
WBC: 9 10*3/uL (ref 4.0–10.5)
nRBC: 0 % (ref 0.0–0.2)

## 2018-10-11 LAB — COMPREHENSIVE METABOLIC PANEL
ALT: 27 U/L (ref 0–44)
AST: 48 U/L — ABNORMAL HIGH (ref 15–41)
Albumin: 3.4 g/dL — ABNORMAL LOW (ref 3.5–5.0)
Alkaline Phosphatase: 72 U/L (ref 38–126)
Anion gap: 14 (ref 5–15)
BUN: 11 mg/dL (ref 8–23)
CO2: 22 mmol/L (ref 22–32)
Calcium: 8.6 mg/dL — ABNORMAL LOW (ref 8.9–10.3)
Chloride: 101 mmol/L (ref 98–111)
Creatinine, Ser: 0.71 mg/dL (ref 0.61–1.24)
GFR calc Af Amer: >60 mL/min (ref >60)
GFR calc non Af Amer: >60 mL/min (ref >60)
Glucose, Bld: 114 mg/dL — ABNORMAL HIGH (ref 70–99)
Potassium: 3.4 mmol/L — ABNORMAL LOW (ref 3.5–5.1)
Sodium: 137 mmol/L (ref 135–145)
Total Bilirubin: 1.2 mg/dL (ref 0.3–1.2)
Total Protein: 5.9 g/dL — ABNORMAL LOW (ref 6.5–8.1)

## 2018-10-11 LAB — LACTIC ACID, PLASMA: Lactic Acid, Venous: 1.4 mmol/L (ref 0.5–1.9)

## 2018-10-11 LAB — CBC
HCT: 44.4 % (ref 39.0–52.0)
Hemoglobin: 14.4 g/dL (ref 13.0–17.0)
MCH: 28 pg (ref 26.0–34.0)
MCHC: 32.4 g/dL (ref 30.0–36.0)
MCV: 86.2 fL (ref 80.0–100.0)
Platelets: 153 10*3/uL (ref 150–400)
RBC: 5.15 MIL/uL (ref 4.22–5.81)
RDW: 13.3 % (ref 11.5–15.5)
WBC: 8 10*3/uL (ref 4.0–10.5)
nRBC: 0 % (ref 0.0–0.2)

## 2018-10-11 LAB — CREATININE, SERUM
Creatinine, Ser: 0.65 mg/dL (ref 0.61–1.24)
GFR calc Af Amer: >60 mL/min (ref >60)
GFR calc non Af Amer: >60 mL/min (ref >60)

## 2018-10-11 LAB — CK: Total CK: 2277 U/L — ABNORMAL HIGH (ref 49–397)

## 2018-10-11 LAB — MAGNESIUM: Magnesium: 1.9 mg/dL (ref 1.7–2.4)

## 2018-10-11 LAB — CBG MONITORING, ED: Glucose-Capillary: 117 mg/dL — ABNORMAL HIGH (ref 70–99)

## 2018-10-11 MED ORDER — SODIUM CHLORIDE 0.9% FLUSH
3.0000 mL | Freq: Two times a day (BID) | INTRAVENOUS | Status: DC
  Administered 2018-10-11 – 2018-10-14 (×4): 3 mL via INTRAVENOUS

## 2018-10-11 MED ORDER — CARBIDOPA-LEVODOPA 25-250 MG PO TABS
1.0000 | ORAL_TABLET | Freq: Three times a day (TID) | ORAL | Status: DC
  Administered 2018-10-11 – 2018-10-14 (×7): 1 via ORAL
  Filled 2018-10-11 (×10): qty 1

## 2018-10-11 MED ORDER — DUTASTERIDE 0.5 MG PO CAPS: 0.5000 mg | ORAL_CAPSULE | Freq: Every day | ORAL | Status: DC

## 2018-10-11 MED ORDER — BUDESONIDE 3 MG PO CPEP
9.0000 mg | ORAL_CAPSULE | Freq: Every day | ORAL | Status: DC
  Administered 2018-10-11 – 2018-10-14 (×4): 9 mg via ORAL
  Filled 2018-10-11 (×4): qty 3

## 2018-10-11 MED ORDER — CITALOPRAM HYDROBROMIDE 20 MG PO TABS: 30.0000 mg | ORAL_TABLET | Freq: Every day | ORAL | Status: DC

## 2018-10-11 MED ORDER — SODIUM CHLORIDE 0.9 % IV SOLN
INTRAVENOUS | Status: DC
  Administered 2018-10-11 – 2018-10-13 (×4): via INTRAVENOUS

## 2018-10-11 MED ORDER — SODIUM CHLORIDE 0.9 % IV BOLUS
1000.0000 mL | Freq: Once | INTRAVENOUS | Status: AC
  Administered 2018-10-11: 1000 mL via INTRAVENOUS

## 2018-10-11 MED ORDER — ACETAMINOPHEN 325 MG PO TABS
650.0000 mg | ORAL_TABLET | Freq: Once | ORAL | Status: AC
  Administered 2018-10-11: 650 mg via ORAL
  Filled 2018-10-11: qty 2

## 2018-10-11 MED ORDER — ENOXAPARIN SODIUM 60 MG/0.6ML ~~LOC~~ SOLN
50.0000 mg | SUBCUTANEOUS | Status: DC
  Administered 2018-10-11 – 2018-10-13 (×2): 50 mg via SUBCUTANEOUS
  Filled 2018-10-11 (×3): qty 0.6

## 2018-10-11 MED ORDER — TAMSULOSIN HCL 0.4 MG PO CAPS
0.4000 mg | ORAL_CAPSULE | Freq: Every day | ORAL | Status: DC
  Administered 2018-10-11 – 2018-10-13 (×2): 0.4 mg via ORAL
  Filled 2018-10-11 (×3): qty 1

## 2018-10-11 MED ORDER — ADULT MULTIVITAMIN W/MINERALS CH
1.0000 | ORAL_TABLET | Freq: Every day | ORAL | Status: DC
  Administered 2018-10-11 – 2018-10-14 (×4): 1 via ORAL
  Filled 2018-10-11 (×4): qty 1

## 2018-10-11 NOTE — ED Notes (Signed)
ED TO INPATIENT HANDOFF REPORT  ED Nurse Name and Phone #: [phone] Varney Baas Name/Age/Gender Kyle Ball 73 y.o. male Room/Bed: 028C/028C  Code Status   Code Status: Prior  Home/SNF/Other Home Patient oriented to: self, place, time and situation Is this baseline? Yes   Triage Complete: Triage complete  Chief Complaint Altered LOC; Fall  Triage Note No notes on file   Allergies Allergies  Allergen Reactions  . Penicillins     CHILDHOOD ALLERGY Has patient had a PCN reaction causing immediate rash, facial/tongue/throat swelling, SOB or lightheadedness with hypotension: Yes Has patient had a PCN reaction causing severe rash involving mucus membranes or skin necrosis: No Has patient had a PCN reaction that required hospitalization: No Has patient had a PCN reaction occurring within the last 10 years:No If all of the above answers are "NO", then may proceed with Cephalosporin use.     Level of Care/Admitting Diagnosis ED Disposition    ED Disposition Condition [facility] Area: [place] [[id number]]  Level of Care: Med-Surg [16]  I expect the patient will be discharged within 24 hours: No (not a candidate for 5C-Observation unit)  Covid Evaluation: N/A  Diagnosis: Rhabdomyolysis [728.88.ICD-9-CM]  Admitting Physician: Jonnie Finner [[id number]]  Attending Physician: Jonnie Finner [[id number]]  PT Class (Do Not Modify): Observation [104]  PT Acc Code (Do Not Modify): Observation [10022]       B Medical/Surgery History Past Medical History:  Diagnosis Date  . Anxiety and depression   . BPH (benign prostatic hypertrophy)   . Crohn's disease ([place]) 1979   After resection, 1 flare in 2012.  Marland Kitchen Dysmetabolic syndrome X   . ED (erectile dysfunction)   . Hematuria   . Hyperlipidemia   . Internal hemorrhoids   . Other abnormalities of gait and mobility   . Parkinson's disease ([place])    [place]  . Repeated falls    Past Surgical  History:  Procedure Laterality Date  . COLONOSCOPY W/ BIOPSIES   06/2007, 2015   Crohn's, internal hemorrhoids, Dr Carlean Purl  . KNEE ARTHROSCOPY Right 06/2013  . PROSTATE BIOPSY     X 2, Dr Risa Grill  . PTNS Treatment     urinary incontinence  . QUADRICEPS REPAIR Right 1966    knee  . REFRACTIVE SURGERY  2015   Dr Bing Plume  . Terminal Ileum & Appendix Resected  1995   Crohn's  . TRANSURETHRAL RESECTION OF PROSTATE N/A 01/23/2018   Procedure: TRANSURETHRAL RESECTION OF THE PROSTATE (TURP);  Surgeon: Ardis Hughs, MD;  Location: [facility];  Service: Urology;  Laterality: N/A;     A IV Location/Drains/Wounds Patient Lines/Drains/Airways Status   Active Line/Drains/Airways    Name:   Placement date:   Placement time:   Site:   Days:   Peripheral IV 04/26/18 Left Antecubital   04/26/18    1124    Antecubital   168   Urethral Catheter Royce RN Triple-lumen 22 Fr.   02/17/18    1740    Triple-lumen   236   External Urinary Catheter   10/24/2018    1659    -   less than 1          Intake/Output Last 24 hours No intake or output data in the 24 hours ending 10/28/2018 1712  Labs/Imaging Results for orders placed or performed during the hospital encounter of 10/20/2018 (from the past 48 hour(s))  CBG monitoring, ED  Status: Abnormal   Collection Time: 11/02/2018  2:22 PM  Result Value Ref Range   Glucose-Capillary 117 (H) 70 - 99 mg/dL  Lactic acid, plasma     Status: None   Collection Time: 10/27/2018  2:25 PM  Result Value Ref Range   Lactic Acid, Venous 1.4 0.5 - 1.9 mmol/L    Comment: Performed at [facility] [address] [place], [place] [address]  Comprehensive metabolic panel     Status: Abnormal   Collection Time: 10/13/2018  2:25 PM  Result Value Ref Range   Sodium 137 135 - 145 mmol/L   Potassium 3.4 (L) 3.5 - 5.1 mmol/L   Chloride 101 98 - 111 mmol/L   CO2 22 22 - 32 mmol/L   Glucose, Bld 114 (H) 70 - 99 mg/dL   BUN 11 8 - 23 mg/dL   Creatinine, Ser 0.71 0.61 - 1.24  mg/dL   Calcium 8.6 (L) 8.9 - 10.3 mg/dL   Total Protein 5.9 (L) 6.5 - 8.1 g/dL   Albumin 3.4 (L) 3.5 - 5.0 g/dL   AST 48 (H) 15 - 41 U/L   ALT 27 0 - 44 U/L   Alkaline Phosphatase 72 38 - 126 U/L   Total Bilirubin 1.2 0.3 - 1.2 mg/dL   GFR calc non Af Amer >60 >60 mL/min   GFR calc Af Amer >60 >60 mL/min   Anion gap 14 5 - 15    Comment: Performed at [facility], [place] [address], [place], [place] [address]  CBC WITH DIFFERENTIAL     Status: Abnormal   Collection Time: 10/13/2018  2:25 PM  Result Value Ref Range   WBC 9.0 4.0 - 10.5 K/uL   RBC 5.63 4.22 - 5.81 MIL/uL   Hemoglobin 16.0 13.0 - 17.0 g/dL   HCT 49.3 39.0 - 52.0 %   MCV 87.6 80.0 - 100.0 fL   MCH 28.4 26.0 - 34.0 pg   MCHC 32.5 30.0 - 36.0 g/dL   RDW 13.2 11.5 - 15.5 %   Platelets 148 (L) 150 - 400 K/uL   nRBC 0.0 0.0 - 0.2 %   Neutrophils Relative % 86 %   Neutro Abs 7.7 1.7 - 7.7 K/uL   Lymphocytes Relative 6 %   Lymphs Abs 0.6 (L) 0.7 - 4.0 K/uL   Monocytes Relative 7 %   Monocytes Absolute 0.7 0.1 - 1.0 K/uL   Eosinophils Relative 0 %   Eosinophils Absolute 0.0 0.0 - 0.5 K/uL   Basophils Relative 0 %   Basophils Absolute 0.0 0.0 - 0.1 K/uL   Immature Granulocytes 1 %   Abs Immature Granulocytes 0.06 0.00 - 0.07 K/uL    Comment: Performed at [facility] [address], [place], [place] [address]  Urinalysis, Routine w reflex microscopic     Status: Abnormal   Collection Time: 11/05/2018  2:25 PM  Result Value Ref Range   Color, Urine YELLOW YELLOW   APPearance CLEAR CLEAR   Specific Gravity, Urine 1.018 1.005 - 1.030   pH 5.0 5.0 - 8.0   Glucose, UA NEGATIVE NEGATIVE mg/dL   Hgb urine dipstick MODERATE (A) NEGATIVE   Bilirubin Urine NEGATIVE NEGATIVE   Ketones, ur 80 (A) NEGATIVE mg/dL   Protein, ur NEGATIVE NEGATIVE mg/dL   Nitrite NEGATIVE NEGATIVE   Leukocytes,Ua NEGATIVE NEGATIVE   RBC / HPF 6-10 0 - 5 RBC/hpf   WBC, UA 0-5 0 - 5 WBC/hpf   Bacteria, UA  RARE (A) NONE SEEN   Squamous  Epithelial / LPF 0-5 0 - 5   Mucus PRESENT     Comment: Performed at [facility], [place] [address] [place], [place] [address]  CK     Status: Abnormal   Collection Time: 11/06/2018  2:25 PM  Result Value Ref Range   Total CK 2,277 (H) 49 - 397 U/L    Comment: Performed at [facility], [place] [address], [place], [place] [address]   Dg Elbow Complete Left  Result Date: 10/19/2018 CLINICAL DATA:  Elbow pain, fall EXAM: LEFT ELBOW - COMPLETE 3+ VIEW COMPARISON:  None. FINDINGS: There is no evidence of fracture, dislocation, or joint effusion. There is no evidence of arthropathy or other focal bone abnormality. Soft tissues are unremarkable. IMPRESSION: Negative. Electronically Signed   By: Franchot Gallo M.D.   On: 11/03/2018 14:57   Ct Head Wo Contrast  Result Date: 11/10/2018 CLINICAL DATA:  Head trauma EXAM: CT HEAD WITHOUT CONTRAST CT CERVICAL SPINE WITHOUT CONTRAST TECHNIQUE: Multidetector CT imaging of the head and cervical spine was performed following the standard protocol without intravenous contrast. Multiplanar CT image reconstructions of the cervical spine were also generated. COMPARISON:  None. FINDINGS: CT HEAD FINDINGS Brain: No evidence of acute infarction, hemorrhage, hydrocephalus, extra-axial collection or mass lesion/mass effect. Mild periventricular white matter hypodensity. Vascular: No hyperdense vessel or unexpected calcification. Skull: Normal. Negative for fracture or focal lesion. Sinuses/Orbits: No acute finding. Other: None. CT CERVICAL SPINE FINDINGS Alignment: Normal. Skull base and vertebrae: No acute fracture. No primary bone lesion or focal pathologic process. Soft tissues and spinal canal: No prevertebral fluid or swelling. No visible canal hematoma. Disc levels: Mild multilevel disc space height loss and osteophytosis. Upper chest: Negative. Other: None. IMPRESSION: 1.  No acute intracranial pathology. 2.  No fracture or static subluxation of the cervical spine.  Electronically Signed   By: Eddie Candle M.D.   On: 10/24/2018 15:48   Ct Cervical Spine Wo Contrast  Result Date: 10/19/2018 CLINICAL DATA:  Head trauma EXAM: CT HEAD WITHOUT CONTRAST CT CERVICAL SPINE WITHOUT CONTRAST TECHNIQUE: Multidetector CT imaging of the head and cervical spine was performed following the standard protocol without intravenous contrast. Multiplanar CT image reconstructions of the cervical spine were also generated. COMPARISON:  None. FINDINGS: CT HEAD FINDINGS Brain: No evidence of acute infarction, hemorrhage, hydrocephalus, extra-axial collection or mass lesion/mass effect. Mild periventricular white matter hypodensity. Vascular: No hyperdense vessel or unexpected calcification. Skull: Normal. Negative for fracture or focal lesion. Sinuses/Orbits: No acute finding. Other: None. CT CERVICAL SPINE FINDINGS Alignment: Normal. Skull base and vertebrae: No acute fracture. No primary bone lesion or focal pathologic process. Soft tissues and spinal canal: No prevertebral fluid or swelling. No visible canal hematoma. Disc levels: Mild multilevel disc space height loss and osteophytosis. Upper chest: Negative. Other: None. IMPRESSION: 1.  No acute intracranial pathology. 2.  No fracture or static subluxation of the cervical spine. Electronically Signed   By: Eddie Candle M.D.   On: 11/01/2018 15:48   Dg Chest Port 1 View  Result Date: 11/05/2018 CLINICAL DATA:  Syncopal episode today. Atrial fibrillation. Weakness. EXAM: PORTABLE CHEST 1 VIEW COMPARISON:  None. FINDINGS: Heart size is normal. There is age related aortic atherosclerosis. The lungs are clear. The pulmonary vascularity is normal. No infiltrate, collapse or effusion. No acute bone finding. IMPRESSION: No active disease. Electronically Signed   By: Nelson Chimes M.D.   On: 11/06/2018 14:57  Pending Labs Unresulted Labs (From admission, onward)    Start     Ordered   10/27/2018 1411  Lactic acid, plasma  Now then every 2 hours,    STAT     10/27/2018 1412   10/28/2018 1411  Blood Culture (routine x 2)  BLOOD CULTURE X 2,   STAT     10/22/2018 1412   10/31/2018 1411  Urine culture  ONCE - STAT,   STAT     10/31/2018 1412          Vitals/Pain Today's Vitals   11/07/2018 1348 11/02/2018 1415  BP: 136/85   Pulse: 84   Resp: 16   Temp: 99.3 F (37.4 C)   TempSrc: Oral   SpO2: 99%   Weight:  104.3 kg  Height:  6\' 2"  (1.88 m)  PainSc:  9     Isolation Precautions No active isolations  Medications Medications  sodium chloride 0.9 % bolus 1,000 mL (1,000 mLs Intravenous Transfusing/Transfer 10/12/2018 1712)  acetaminophen (TYLENOL) tablet 650 mg (650 mg Oral Given 10/24/2018 1638)  sodium chloride 0.9 % bolus 1,000 mL (1,000 mLs Intravenous Transfusing/Transfer 10/26/2018 1712)    Mobility walks with device     Focused Assessments Neuro Assessment Handoff:  Swallow screen pass? Yes          Neuro Assessment: Exceptions to WDL Neuro Checks:      Last Documented NIHSS Modified Score:   Has TPA been given? No    If patient is a Neuro Trauma and patient is going to OR before floor call report to [place] nurse: [phone] or [phone]     R Recommendations: See Admitting Provider Note  Report given to:   Additional Notes:

## 2018-10-11 NOTE — ED Triage Notes (Signed)
Pt's ex-wife called EMS when she found him down on floor, prone. She had taken him to pcp's office for uti tx the day before and had not seen him sine 1800 last night.  She had to break a window to ender the house. Redness and edema to L side of face, chest and abd.  Pt was incontinent of urine.  Pt with hx of parkinsons and was prescribed meds for uti, but did not fill rx yet.  HR bw 60-140.  bp 160/100, sats 95% on ra, rr 30, capno 40, cbg 126.  Temporal 99.3.  Pt did respond appropriately to loc questions, but pt appears to be having hallucinations.  Given 1L NS en-route.  18 L AC, 18 L hand.

## 2018-10-11 NOTE — ED Provider Notes (Signed)
73 yo M with a chief complaint of a fall off the couch.  The patient was sitting on it last night and was reaching for something lost his balance and ended up on the floor.  He unfortunately was unable to get up from that position and laid there overnight.  He has signs of skin irritation from laying in 1 position.  I received the patient in signout from Dr. Melina Copa.  Patient is high likelihood to have rhabdomyolysis.  Plan is for IV fluids and likely hospitalist admission.  Awaiting lab work and imaging studies.  The patient is clinically feeling very weak on my reassessment.  He does have a CK that is greater than 2000.  His renal function appears to be at baseline currently.  Plain film of the elbow where the patient fell is unremarkable as viewed by me.  CT of the head and C-spine are also read as negative.   Kyle Etienne, DO 10/21/2018 1918

## 2018-10-11 NOTE — H&P (Addendum)
.  History and Physical    Kyle Ball BHA:[id number] DOB: [birth year] DOA: 11/10/2018  PCP: [name], MD Patient coming from: Home   Chief Complaint: Fall  HPI: Kyle Ball is a 73 y.o. male with medical history significant of recurrent UTI, Parkinsons, Crohns. Presents after fall off of his couch while trying to reach for his cellphone. He denies any LOC. He remembers the entire fall. He did fall on left elbow and has some pain associated. He states that after the fall he rolled over and was unable to get up. He was stuck on the floor all night until he was found by family. He reports that he recently saw his doctor for concern of UTI symptoms (lower back pain similar to his previous UTIs). He believes this has contributed to his current condition. He otherwise denies complaints. He was started on rocephin in the ED for presumed UTI. He was started on fluids for rhabdo. [facility] was called for admission.   Review of Systems: Denies fevers, N, V, HA, dizziness, CP, palpitations. Remainder of 10 point review of systems negative for all not mentioned in HPI.    Past Medical History:  Diagnosis Date  . Anxiety and depression   . BPH (benign prostatic hypertrophy)   . Crohn's disease ([place]) 1979   After resection, 1 flare in 2012.  Marland Kitchen Dysmetabolic syndrome X   . ED (erectile dysfunction)   . Hematuria   . Hyperlipidemia   . Internal hemorrhoids   . Other abnormalities of gait and mobility   . Parkinson's disease ([place])    [place]  . Repeated falls     Past Surgical History:  Procedure Laterality Date  . COLONOSCOPY W/ BIOPSIES   06/2007, 2015   Crohn's, internal hemorrhoids, Dr Carlean Purl  . KNEE ARTHROSCOPY Right 06/2013  . PROSTATE BIOPSY     X 2, Dr Risa Grill  . PTNS Treatment     urinary incontinence  . QUADRICEPS REPAIR Right 1966    knee  . REFRACTIVE SURGERY  2015   Dr Bing Plume  . Terminal Ileum & Appendix Resected  1995   Crohn's  . TRANSURETHRAL RESECTION OF PROSTATE  N/A 01/23/2018   Procedure: TRANSURETHRAL RESECTION OF THE PROSTATE (TURP);  Surgeon: Ardis Hughs, MD;  Location: [facility];  Service: Urology;  Laterality: N/A;     reports that he has never smoked. He has never used smokeless tobacco. He reports current alcohol use of about 14.0 standard drinks of alcohol per week. He reports that he does not use drugs.  Allergies  Allergen Reactions  . Penicillins     CHILDHOOD ALLERGY Has patient had a PCN reaction causing immediate rash, facial/tongue/throat swelling, SOB or lightheadedness with hypotension: Yes Has patient had a PCN reaction causing severe rash involving mucus membranes or skin necrosis: No Has patient had a PCN reaction that required hospitalization: No Has patient had a PCN reaction occurring within the last 10 years:No If all of the above answers are "NO", then may proceed with Cephalosporin use.     Family History  Problem Relation Age of Onset  . Uterine cancer Mother        metastatic to liver  . Pancreatic cancer Mother   . Hypertension Father   . Diabetes Father   . Stroke Father 58  . Heart attack Father 62  . Testicular cancer Brother 89  . Fibromyalgia Sister   . Mental illness Sister        chronic  depression  . Colon cancer Neg Hx   . Colon polyps Neg Hx   . Rectal cancer Neg Hx   . Stomach cancer Neg Hx    Unacceptable: Noncontributory, unremarkable, or negative. Acceptable: Family history reviewed and not pertinent (If you reviewed it)  Prior to Admission medications   Medication Sig Start Date End Date Taking? Authorizing Provider  atorvastatin (LIPITOR) 20 MG tablet Take 1 tablet (20 mg total) daily by mouth. Needs to establish with new PCP for more refills. Patient taking differently: Take 20 mg by mouth at bedtime.  04/23/17   Lance Sell, NP  budesonide (ENTOCORT EC) 3 MG 24 hr capsule Take 3 capsules (9 mg total) by mouth daily. 11/08/17   Gatha Mayer, MD  carbidopa-levodopa  (SINEMET IR) 25-250 MG tablet Take 1 tablet by mouth 3 (three) times daily. 10/17/17   [provider]  citalopram (CELEXA) 20 MG tablet Take 30 mg by mouth daily.     [provider]  dutasteride (AVODART) 0.5 MG capsule Take 0.5 mg by mouth daily. 01/23/18   [provider]  tamsulosin (FLOMAX) 0.4 MG CAPS capsule Take 0.4 mg by mouth at bedtime. 01/24/18   [provider]    Physical Exam: Vitals:   11/05/2018 1630 10/21/2018 1645 10/16/2018 1700 11/07/2018 1715  BP:  (!) 141/76 135/64 135/67  Pulse: 72 78 73 74  Resp: 18 (!) 34 15 19  Temp:      TempSrc:      SpO2: 98% 100% 98% 97%  Weight:      Height:        Constitutional: NAD, calm, comfortable Vitals:   11/08/2018 1630 10/24/2018 1645 10/21/2018 1700 10/20/2018 1715  BP:  (!) 141/76 135/64 135/67  Pulse: 72 78 73 74  Resp: 18 (!) 34 15 19  Temp:      TempSrc:      SpO2: 98% 100% 98% 97%  Weight:      Height:       Eyes: PERRL, lids and conjunctivae normal ENMT: Mucous membranes are moist. Posterior pharynx clear of any exudate or lesions.Normal dentition.  Neck: normal, supple, no masses, no thyromegaly Respiratory: clear to auscultation bilaterally, no wheezing, no crackles. Normal respiratory effort. No accessory muscle use.  Cardiovascular: Regular rate and rhythm, no murmurs / rubs / gallops. No extremity edema. 2+ pedal pulses. No carotid bruits.  Abdomen: no tenderness, no masses palpated. No hepatosplenomegaly. Bowel sounds positive.  Musculoskeletal: no clubbing / cyanosis. No joint deformity upper and lower extremities. Good ROM in all extremities w/ some pain noted in flexion of LUE at elbow Skin: no ulcers. No induration, LLE w/ abrasions laterally Neurologic: CN 2-12 grossly intact. Sensation intact, DTR normal. Strength 5/5 in all 4.  Psychiatric: Normal judgment and insight. Alert and oriented x 3. Normal mood.   Labs on Admission: I have personally reviewed following labs and imaging  studies  CBC: Recent Labs  Lab 10/23/2018 1425  WBC 9.0  NEUTROABS 7.7  HGB 16.0  HCT 49.3  MCV 87.6  PLT 785*   Basic Metabolic Panel: Recent Labs  Lab 11/06/2018 1425  NA 137  K 3.4*  CL 101  CO2 22  GLUCOSE 114*  BUN 11  CREATININE 0.71  CALCIUM 8.6*   GFR: Estimated Creatinine Clearance: 110.6 mL/min (by C-G formula based on SCr of 0.71 mg/dL). Liver Function Tests: Recent Labs  Lab 11/06/2018 1425  AST 48*  ALT 27  ALKPHOS 72  BILITOT  1.2  PROT 5.9*  ALBUMIN 3.4*   No results for input(s): LIPASE, AMYLASE in the last 168 hours. No results for input(s): AMMONIA in the last 168 hours. Coagulation Profile: No results for input(s): INR, PROTIME in the last 168 hours. Cardiac Enzymes: Recent Labs  Lab 10/31/2018 1425  CKTOTAL 2,277*   BNP (last 3 results) No results for input(s): PROBNP in the last 8760 hours. HbA1C: No results for input(s): HGBA1C in the last 72 hours. CBG: Recent Labs  Lab 10/28/2018 1422  GLUCAP 117*   Lipid Profile: No results for input(s): CHOL, HDL, LDLCALC, TRIG, CHOLHDL, LDLDIRECT in the last 72 hours. Thyroid Function Tests: No results for input(s): TSH, T4TOTAL, FREET4, T3FREE, THYROIDAB in the last 72 hours. Anemia Panel: No results for input(s): VITAMINB12, FOLATE, FERRITIN, TIBC, IRON, RETICCTPCT in the last 72 hours. Urine analysis:    Component Value Date/Time   COLORURINE YELLOW 10/13/2018 1425   APPEARANCEUR CLEAR 10/13/2018 1425   LABSPEC 1.018 10/12/2018 1425   PHURINE 5.0 10/18/2018 1425   GLUCOSEU NEGATIVE 10/19/2018 1425   HGBUR MODERATE (A) 10/28/2018 1425   BILIRUBINUR NEGATIVE 11/04/2018 1425   KETONESUR 80 (A) 10/31/2018 1425   PROTEINUR NEGATIVE 10/26/2018 1425   NITRITE NEGATIVE 10/15/2018 [place] 10/12/2018 1425    Radiological Exams on Admission: Dg Elbow Complete Left  Result Date: 10/30/2018 CLINICAL DATA:  Elbow pain, fall EXAM: LEFT ELBOW - COMPLETE 3+ VIEW COMPARISON:   None. FINDINGS: There is no evidence of fracture, dislocation, or joint effusion. There is no evidence of arthropathy or other focal bone abnormality. Soft tissues are unremarkable. IMPRESSION: Negative. Electronically Signed   By: Franchot Gallo M.D.   On: 10/17/2018 14:57   Ct Head Wo Contrast  Result Date: 10/31/2018 CLINICAL DATA:  Head trauma EXAM: CT HEAD WITHOUT CONTRAST CT CERVICAL SPINE WITHOUT CONTRAST TECHNIQUE: Multidetector CT imaging of the head and cervical spine was performed following the standard protocol without intravenous contrast. Multiplanar CT image reconstructions of the cervical spine were also generated. COMPARISON:  None. FINDINGS: CT HEAD FINDINGS Brain: No evidence of acute infarction, hemorrhage, hydrocephalus, extra-axial collection or mass lesion/mass effect. Mild periventricular white matter hypodensity. Vascular: No hyperdense vessel or unexpected calcification. Skull: Normal. Negative for fracture or focal lesion. Sinuses/Orbits: No acute finding. Other: None. CT CERVICAL SPINE FINDINGS Alignment: Normal. Skull base and vertebrae: No acute fracture. No primary bone lesion or focal pathologic process. Soft tissues and spinal canal: No prevertebral fluid or swelling. No visible canal hematoma. Disc levels: Mild multilevel disc space height loss and osteophytosis. Upper chest: Negative. Other: None. IMPRESSION: 1.  No acute intracranial pathology. 2.  No fracture or static subluxation of the cervical spine. Electronically Signed   By: Eddie Candle M.D.   On: 10/18/2018 15:48   Ct Cervical Spine Wo Contrast  Result Date: 11/04/2018 CLINICAL DATA:  Head trauma EXAM: CT HEAD WITHOUT CONTRAST CT CERVICAL SPINE WITHOUT CONTRAST TECHNIQUE: Multidetector CT imaging of the head and cervical spine was performed following the standard protocol without intravenous contrast. Multiplanar CT image reconstructions of the cervical spine were also generated. COMPARISON:  None. FINDINGS: CT HEAD  FINDINGS Brain: No evidence of acute infarction, hemorrhage, hydrocephalus, extra-axial collection or mass lesion/mass effect. Mild periventricular white matter hypodensity. Vascular: No hyperdense vessel or unexpected calcification. Skull: Normal. Negative for fracture or focal lesion. Sinuses/Orbits: No acute finding. Other: None. CT CERVICAL SPINE FINDINGS Alignment: Normal. Skull base and vertebrae: No acute fracture. No primary bone lesion or  focal pathologic process. Soft tissues and spinal canal: No prevertebral fluid or swelling. No visible canal hematoma. Disc levels: Mild multilevel disc space height loss and osteophytosis. Upper chest: Negative. Other: None. IMPRESSION: 1.  No acute intracranial pathology. 2.  No fracture or static subluxation of the cervical spine. Electronically Signed   By: Eddie Candle M.D.   On: 10/26/2018 15:48   Dg Chest Port 1 View  Result Date: 10/27/2018 CLINICAL DATA:  Syncopal episode today. Atrial fibrillation. Weakness. EXAM: PORTABLE CHEST 1 VIEW COMPARISON:  None. FINDINGS: Heart size is normal. There is age related aortic atherosclerosis. The lungs are clear. The pulmonary vascularity is normal. No infiltrate, collapse or effusion. No acute bone finding. IMPRESSION: No active disease. Electronically Signed   By: Nelson Chimes M.D.   On: 10/19/2018 14:57     Assessment/Plan Principal Problem:   Rhabdomyolysis Active Problems:   Anxiety state   BPH (benign prostatic hyperplasia)   Parkinson's disease (HCC)   Obesity   UTI (urinary tract infection)   Hypokalemia   Thrombocytopenia (HCC)   Fall at home, initial encounter    Rhabdomyolysis     - admit to observation     - serial CPK     - NS 100cc/hr  Anxiety state     - resume home celexa  BPH (benign prostatic hyperplasia)     - resume home flomax/avodart  Parkinson's disease ([place])     - resume home sinemet  Obesity     - counseled on diet/exercise  UTI (urinary tract infection)     -  questionable, UCx pending, hold abx for now.  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   Jonnie Finner DO [facility] Pager [phone]  If 7PM-7AM, please contact night-coverage [email or web address] Password [facility]  10/26/2018, 5:58 PM

## 2018-10-11 NOTE — ED Provider Notes (Signed)
[facility] Provider Note   CSN: [id number] Arrival date & time: 10/15/2018  1339    History   Chief Complaint Chief Complaint  Patient presents with   Fall   Altered Mental Status    HPI Kyle Ball is a 73 y.o. male.  He has a history of Parkinson's and also history of urinary tract infections.  He said he was recently at the urologist because of urinary symptoms and was told although the urine looked clean that he probably has an infection.  He has not started on antibiotics.  He had been sequestering with his ex-wife in [place] but just came back to his house here locally.  He said he was sitting on the couch and was reaching for something on the floor when he fell to the floor and was unable to get himself back up.  He says this was around 7 PM and it was until his ex-wife came to check on him today that he was found on the floor.  He is complaining of some left elbow pain and feeling cold.  Was found to have a low-grade temperature here.  Reportedly was altered but has been appropriate here.     The history is provided by the patient and the EMS personnel.  Fall  This is a new problem. The current episode started yesterday. The problem occurs constantly. The problem has not changed since onset.Pertinent negatives include no chest pain, no abdominal pain, no headaches and no shortness of breath. Nothing aggravates the symptoms. Nothing relieves the symptoms. He has tried nothing for the symptoms. The treatment provided no relief.    Past Medical History:  Diagnosis Date   Anxiety and depression    BPH (benign prostatic hypertrophy)    Crohn's disease ([place]) 1979   After resection, 1 flare in 2012.   Dysmetabolic syndrome X    ED (erectile dysfunction)    Hematuria    Hyperlipidemia    Internal hemorrhoids    Other abnormalities of gait and mobility    Parkinson's disease ([place])    [facility]   Repeated falls     Patient  Active Problem List   Diagnosis Date Noted   Cyst of pancreas 03/05/2018   BPH with obstruction/lower urinary tract symptoms 01/23/2018   Acute lower UTI 10/27/2017   UTI (urinary tract infection) 10/27/2017   Medicare annual wellness visit, subsequent 12/04/2016   Obesity 12/04/2016   Hyperglycemia 02/04/2015   Elevated blood pressure reading without diagnosis of hypertension 03/05/2013   Nonspecific abnormal electrocardiogram (ECG) (EKG) 08/31/2011   Parkinson's disease ([place]) 06/21/2010   ANXIETY 07/01/2007   BPH (benign prostatic hyperplasia) 07/01/2007   HYPERLIPIDEMIA 04/10/2007   DISORDER, DYSMETABOLIC SYNDROME X 61/44/3154   DISORDER, DEPRESSIVE NEC 04/10/2007   ELEVATED PROSTATE SPECIFIC ANTIGEN 04/10/2007   History of non anemic vitamin B12 deficiency 10/26/2006   ERECTILE DYSFUNCTION 10/26/2006   Crohn's disease of small intestine with intestinal obstruction ([place]) 10/26/2006    Past Surgical History:  Procedure Laterality Date   COLONOSCOPY W/ BIOPSIES   06/2007, 2015   Crohn's, internal hemorrhoids, Dr Carlean Purl   KNEE ARTHROSCOPY Right 06/2013   PROSTATE BIOPSY     X 2, Dr Risa Grill   PTNS Treatment     urinary incontinence   QUADRICEPS REPAIR Right 1966    knee   REFRACTIVE SURGERY  2015   Dr Bing Plume   Terminal Ileum & Appendix Resected  1995   Crohn's   TRANSURETHRAL RESECTION  OF PROSTATE N/A 01/23/2018   Procedure: TRANSURETHRAL RESECTION OF THE PROSTATE (TURP);  Surgeon: Ardis Hughs, MD;  Location: [facility];  Service: Urology;  Laterality: N/A;        Home Medications    Prior to Admission medications   Medication Sig Start Date End Date Taking? Authorizing Provider  atorvastatin (LIPITOR) 20 MG tablet Take 1 tablet (20 mg total) daily by mouth. Needs to establish with new PCP for more refills. Patient taking differently: Take 20 mg by mouth at bedtime.  04/23/17   Lance Sell, NP  budesonide (ENTOCORT EC) 3 MG 24 hr  capsule Take 3 capsules (9 mg total) by mouth daily. 11/08/17   Gatha Mayer, MD  budesonide (ENTOCORT EC) 3 MG 24 hr capsule TAKE 3 CAPSULES BY MOUTH EVERY DAY 05/03/18   Gatha Mayer, MD  carbidopa-levodopa (SINEMET IR) 25-250 MG tablet Take 1 tablet by mouth 3 (three) times daily. 10/17/17   [provider]  citalopram (CELEXA) 20 MG tablet Take 30 mg by mouth daily.     [provider]  dutasteride (AVODART) 0.5 MG capsule Take 0.5 mg by mouth daily. 01/23/18   [provider]  tamsulosin (FLOMAX) 0.4 MG CAPS capsule Take 0.4 mg by mouth at bedtime. 01/24/18   [provider]    Family History Family History  Problem Relation Age of Onset   Uterine cancer Mother        metastatic to liver   Pancreatic cancer Mother    Hypertension Father    Diabetes Father    Stroke Father 4   Heart attack Father 30   Testicular cancer Brother 25   Fibromyalgia Sister    Mental illness Sister        chronic depression   Colon cancer Neg Hx    Colon polyps Neg Hx    Rectal cancer Neg Hx    Stomach cancer Neg Hx     Social History Social History   Tobacco Use   Smoking status: Never Smoker   Smokeless tobacco: Never Used  Substance Use Topics   Alcohol use: Yes    Alcohol/week: 14.0 standard drinks    Types: 14 Glasses of wine per week   Drug use: No     Allergies   Penicillins   Review of Systems Review of Systems  Constitutional: Positive for fever.  HENT: Negative for sore throat.   Eyes: Negative for visual disturbance.  Respiratory: Negative for shortness of breath.   Cardiovascular: Negative for chest pain.  Gastrointestinal: Negative for abdominal pain.  Genitourinary: Positive for dysuria.  Musculoskeletal: Negative for neck pain.  Skin: Negative for rash.  Neurological: Positive for tremors. Negative for headaches.     Physical Exam Updated Vital Signs BP 136/85 (BP Location: Right Arm)    Pulse 84    Temp  99.3 F (37.4 C) (Oral)    Resp 16    SpO2 99%   Physical Exam Vitals signs and nursing note reviewed.  Constitutional:      Appearance: He is well-developed.  HENT:     Head: Normocephalic and atraumatic.  Eyes:     Comments: He has some edema of his face and his eyelids.  Neck:     Musculoskeletal: Neck supple.  Cardiovascular:     Rate and Rhythm: Regular rhythm. Tachycardia present.     Heart sounds: No murmur.  Pulmonary:     Effort: Pulmonary effort is normal. No respiratory distress.  Breath sounds: Normal breath sounds.  Abdominal:     Palpations: Abdomen is soft.     Tenderness: There is no abdominal tenderness.  Musculoskeletal: Normal range of motion.        General: Tenderness present.     Comments: He has some tenderness about his left elbow.  Skin:    General: Skin is warm and dry.     Capillary Refill: Capillary refill takes less than 2 seconds.     Findings: Rash present.     Comments: Patient has some erythema over his face anterior chest and abdominal wall.  Neurological:     Mental Status: He is alert and oriented to person, place, and time. Mental status is at baseline.     Comments: Patient is awake and oriented.  He has some baseline tremor of his upper extremities.  He is following commands and moving all extremities.      ED Treatments / Results  Labs (all labs ordered are listed, but only abnormal results are displayed) Labs Reviewed  COMPREHENSIVE METABOLIC PANEL - Abnormal; Notable for the following components:      Result Value   Potassium 3.4 (*)    Glucose, Bld 114 (*)    Calcium 8.6 (*)    Total Protein 5.9 (*)    Albumin 3.4 (*)    AST 48 (*)    All other components within normal limits  CBC WITH DIFFERENTIAL/PLATELET - Abnormal; Notable for the following components:   Platelets 148 (*)    Lymphs Abs 0.6 (*)    All other components within normal limits  URINALYSIS, ROUTINE W REFLEX MICROSCOPIC - Abnormal; Notable for the  following components:   Hgb urine dipstick MODERATE (*)    Ketones, ur 80 (*)    Bacteria, UA RARE (*)    All other components within normal limits  CK - Abnormal; Notable for the following components:   Total CK 2,277 (*)    All other components within normal limits  CBG MONITORING, ED - Abnormal; Notable for the following components:   Glucose-Capillary 117 (*)    All other components within normal limits  CULTURE, BLOOD (ROUTINE X 2)  CULTURE, BLOOD (ROUTINE X 2)  URINE CULTURE  LACTIC ACID, PLASMA  LACTIC ACID, PLASMA  MAGNESIUM    EKG EKG Interpretation  Date/Time:  Friday Oct 11 2018 13:47:42 EDT Ventricular Rate:  81 PR Interval:    QRS Duration: 157 QT Interval:  391 QTC Calculation: 454 R Axis:   -73 Text Interpretation:  Sinus rhythm Short PR interval Right bundle branch block Inferior infarct, old Lateral leads are also involved poor data quality, likely similar to prior 8/19 Confirmed by Aletta Edouard [phone]) on 11/05/2018 1:49:58 PM   Radiology Dg Elbow Complete Left  Result Date: 10/23/2018 CLINICAL DATA:  Elbow pain, fall EXAM: LEFT ELBOW - COMPLETE 3+ VIEW COMPARISON:  None. FINDINGS: There is no evidence of fracture, dislocation, or joint effusion. There is no evidence of arthropathy or other focal bone abnormality. Soft tissues are unremarkable. IMPRESSION: Negative. Electronically Signed   By: Franchot Gallo M.D.   On: 11/07/2018 14:57   Ct Head Wo Contrast  Result Date: 10/20/2018 CLINICAL DATA:  Head trauma EXAM: CT HEAD WITHOUT CONTRAST CT CERVICAL SPINE WITHOUT CONTRAST TECHNIQUE: Multidetector CT imaging of the head and cervical spine was performed following the standard protocol without intravenous contrast. Multiplanar CT image reconstructions of the cervical spine were also generated. COMPARISON:  None. FINDINGS: CT HEAD FINDINGS Brain:  No evidence of acute infarction, hemorrhage, hydrocephalus, extra-axial collection or mass lesion/mass effect. Mild  periventricular white matter hypodensity. Vascular: No hyperdense vessel or unexpected calcification. Skull: Normal. Negative for fracture or focal lesion. Sinuses/Orbits: No acute finding. Other: None. CT CERVICAL SPINE FINDINGS Alignment: Normal. Skull base and vertebrae: No acute fracture. No primary bone lesion or focal pathologic process. Soft tissues and spinal canal: No prevertebral fluid or swelling. No visible canal hematoma. Disc levels: Mild multilevel disc space height loss and osteophytosis. Upper chest: Negative. Other: None. IMPRESSION: 1.  No acute intracranial pathology. 2.  No fracture or static subluxation of the cervical spine. Electronically Signed   By: Eddie Candle M.D.   On: 11/07/2018 15:48   Ct Cervical Spine Wo Contrast  Result Date: 10/21/2018 CLINICAL DATA:  Head trauma EXAM: CT HEAD WITHOUT CONTRAST CT CERVICAL SPINE WITHOUT CONTRAST TECHNIQUE: Multidetector CT imaging of the head and cervical spine was performed following the standard protocol without intravenous contrast. Multiplanar CT image reconstructions of the cervical spine were also generated. COMPARISON:  None. FINDINGS: CT HEAD FINDINGS Brain: No evidence of acute infarction, hemorrhage, hydrocephalus, extra-axial collection or mass lesion/mass effect. Mild periventricular white matter hypodensity. Vascular: No hyperdense vessel or unexpected calcification. Skull: Normal. Negative for fracture or focal lesion. Sinuses/Orbits: No acute finding. Other: None. CT CERVICAL SPINE FINDINGS Alignment: Normal. Skull base and vertebrae: No acute fracture. No primary bone lesion or focal pathologic process. Soft tissues and spinal canal: No prevertebral fluid or swelling. No visible canal hematoma. Disc levels: Mild multilevel disc space height loss and osteophytosis. Upper chest: Negative. Other: None. IMPRESSION: 1.  No acute intracranial pathology. 2.  No fracture or static subluxation of the cervical spine. Electronically Signed    By: Eddie Candle M.D.   On: 11/05/2018 15:48   Dg Chest Port 1 View  Result Date: 11/08/2018 CLINICAL DATA:  Syncopal episode today. Atrial fibrillation. Weakness. EXAM: PORTABLE CHEST 1 VIEW COMPARISON:  None. FINDINGS: Heart size is normal. There is age related aortic atherosclerosis. The lungs are clear. The pulmonary vascularity is normal. No infiltrate, collapse or effusion. No acute bone finding. IMPRESSION: No active disease. Electronically Signed   By: Nelson Chimes M.D.   On: 10/26/2018 14:57    Procedures Procedures (including critical care time)  Medications Ordered in ED Medications  sodium chloride 0.9 % bolus 1,000 mL (1,000 mLs Intravenous Transfusing/Transfer 11/10/2018 1712)  acetaminophen (TYLENOL) tablet 650 mg (650 mg Oral Given 10/26/2018 1638)  sodium chloride 0.9 % bolus 1,000 mL (1,000 mLs Intravenous Transfusing/Transfer 10/30/2018 1712)     Initial Impression / Assessment and Plan / ED Course  I have reviewed the triage vital signs and the nursing notes.  Pertinent labs & imaging results that were available during my care of the patient were reviewed by me and considered in my medical decision making (see chart for details).  Clinical Course as of Oct 11 1742  Fri Oct 11, 2018  1420 Differential includes sepsis, dehydration, rhabdo, stroke, bleed, metabolic derangement.  He is getting some IV fluids here and some Tylenol for low-grade temp.  Getting head CT C-spine CT x-rays of his chest and elbow.  Labs including urinalysis lactate blood cultures.   [MB]    Clinical Course User Index [MB] Hayden Rasmussen, MD        Final Clinical Impressions(s) / ED Diagnoses   Final diagnoses:  Generalized weakness  Traumatic rhabdomyolysis, initial encounter [facility])    ED Discharge Orders  None       Hayden Rasmussen, MD 11/10/2018 1745

## 2018-10-11 NOTE — ED Notes (Signed)
Updated ex-wife who is pt's power of attorney

## 2018-10-12 DIAGNOSIS — Y92009 Unspecified place in unspecified non-institutional (private) residence as the place of occurrence of the external cause: Secondary | ICD-10-CM | POA: Diagnosis not present

## 2018-10-12 DIAGNOSIS — Z66 Do not resuscitate: Secondary | ICD-10-CM | POA: Diagnosis present

## 2018-10-12 DIAGNOSIS — T796XXA Traumatic ischemia of muscle, initial encounter: Secondary | ICD-10-CM | POA: Diagnosis present

## 2018-10-12 DIAGNOSIS — Z8049 Family history of malignant neoplasm of other genital organs: Secondary | ICD-10-CM | POA: Diagnosis not present

## 2018-10-12 DIAGNOSIS — I469 Cardiac arrest, cause unspecified: Secondary | ICD-10-CM | POA: Diagnosis not present

## 2018-10-12 DIAGNOSIS — K5090 Crohn's disease, unspecified, without complications: Secondary | ICD-10-CM | POA: Diagnosis present

## 2018-10-12 DIAGNOSIS — W19XXXA Unspecified fall, initial encounter: Secondary | ICD-10-CM | POA: Diagnosis not present

## 2018-10-12 DIAGNOSIS — E669 Obesity, unspecified: Secondary | ICD-10-CM | POA: Diagnosis present

## 2018-10-12 DIAGNOSIS — Z7952 Long term (current) use of systemic steroids: Secondary | ICD-10-CM | POA: Diagnosis not present

## 2018-10-12 DIAGNOSIS — M25522 Pain in left elbow: Secondary | ICD-10-CM | POA: Diagnosis present

## 2018-10-12 DIAGNOSIS — N390 Urinary tract infection, site not specified: Secondary | ICD-10-CM | POA: Diagnosis present

## 2018-10-12 DIAGNOSIS — N401 Enlarged prostate with lower urinary tract symptoms: Secondary | ICD-10-CM | POA: Diagnosis present

## 2018-10-12 DIAGNOSIS — E876 Hypokalemia: Secondary | ICD-10-CM | POA: Diagnosis present

## 2018-10-12 DIAGNOSIS — R5381 Other malaise: Secondary | ICD-10-CM | POA: Diagnosis present

## 2018-10-12 DIAGNOSIS — Z8043 Family history of malignant neoplasm of testis: Secondary | ICD-10-CM | POA: Diagnosis not present

## 2018-10-12 DIAGNOSIS — W08XXXA Fall from other furniture, initial encounter: Secondary | ICD-10-CM | POA: Diagnosis present

## 2018-10-12 DIAGNOSIS — G20 Parkinson's disease: Secondary | ICD-10-CM

## 2018-10-12 DIAGNOSIS — Z20828 Contact with and (suspected) exposure to other viral communicable diseases: Secondary | ICD-10-CM | POA: Diagnosis present

## 2018-10-12 DIAGNOSIS — Z8249 Family history of ischemic heart disease and other diseases of the circulatory system: Secondary | ICD-10-CM | POA: Diagnosis not present

## 2018-10-12 DIAGNOSIS — Z818 Family history of other mental and behavioral disorders: Secondary | ICD-10-CM | POA: Diagnosis not present

## 2018-10-12 DIAGNOSIS — R531 Weakness: Secondary | ICD-10-CM | POA: Diagnosis not present

## 2018-10-12 DIAGNOSIS — Z800 Family history of malignant neoplasm of digestive organs: Secondary | ICD-10-CM | POA: Diagnosis not present

## 2018-10-12 DIAGNOSIS — Y92018 Other place in single-family (private) house as the place of occurrence of the external cause: Secondary | ICD-10-CM | POA: Diagnosis not present

## 2018-10-12 DIAGNOSIS — Z9079 Acquired absence of other genital organ(s): Secondary | ICD-10-CM | POA: Diagnosis not present

## 2018-10-12 DIAGNOSIS — F411 Generalized anxiety disorder: Secondary | ICD-10-CM | POA: Diagnosis not present

## 2018-10-12 DIAGNOSIS — Z87440 Personal history of urinary (tract) infections: Secondary | ICD-10-CM | POA: Diagnosis not present

## 2018-10-12 DIAGNOSIS — D696 Thrombocytopenia, unspecified: Secondary | ICD-10-CM | POA: Diagnosis present

## 2018-10-12 DIAGNOSIS — N138 Other obstructive and reflux uropathy: Secondary | ICD-10-CM | POA: Diagnosis present

## 2018-10-12 LAB — COMPREHENSIVE METABOLIC PANEL
ALT: 8 U/L (ref 0–44)
AST: 45 U/L — ABNORMAL HIGH (ref 15–41)
Albumin: 2.9 g/dL — ABNORMAL LOW (ref 3.5–5.0)
Alkaline Phosphatase: 64 U/L (ref 38–126)
Anion gap: 9 (ref 5–15)
BUN: 9 mg/dL (ref 8–23)
CO2: 22 mmol/L (ref 22–32)
Calcium: 8.5 mg/dL — ABNORMAL LOW (ref 8.9–10.3)
Chloride: 108 mmol/L (ref 98–111)
Creatinine, Ser: 0.76 mg/dL (ref 0.61–1.24)
GFR calc Af Amer: >60 mL/min (ref >60)
GFR calc non Af Amer: >60 mL/min (ref >60)
Glucose, Bld: 98 mg/dL (ref 70–99)
Potassium: 3.3 mmol/L — ABNORMAL LOW (ref 3.5–5.1)
Sodium: 139 mmol/L (ref 135–145)
Total Bilirubin: 1.3 mg/dL — ABNORMAL HIGH (ref 0.3–1.2)
Total Protein: 5.3 g/dL — ABNORMAL LOW (ref 6.5–8.1)

## 2018-10-12 LAB — CBC
HCT: 43.8 % (ref 39.0–52.0)
Hemoglobin: 14.6 g/dL (ref 13.0–17.0)
MCH: 28.6 pg (ref 26.0–34.0)
MCHC: 33.3 g/dL (ref 30.0–36.0)
MCV: 85.9 fL (ref 80.0–100.0)
Platelets: 153 10*3/uL (ref 150–400)
RBC: 5.1 MIL/uL (ref 4.22–5.81)
RDW: 13.6 % (ref 11.5–15.5)
WBC: 7 10*3/uL (ref 4.0–10.5)
nRBC: 0 % (ref 0.0–0.2)

## 2018-10-12 LAB — MAGNESIUM: Magnesium: 2 mg/dL (ref 1.7–2.4)

## 2018-10-12 LAB — CK: Total CK: 1508 U/L — ABNORMAL HIGH (ref 49–397)

## 2018-10-12 LAB — URINE CULTURE

## 2018-10-12 LAB — HIV ANTIBODY (ROUTINE TESTING W REFLEX): HIV Screen 4th Generation wRfx: NONREACTIVE

## 2018-10-12 MED ORDER — POTASSIUM CHLORIDE CRYS ER 20 MEQ PO TBCR
40.0000 meq | EXTENDED_RELEASE_TABLET | Freq: Two times a day (BID) | ORAL | Status: AC
  Administered 2018-10-12: 40 meq via ORAL
  Filled 2018-10-12 (×2): qty 2

## 2018-10-12 MED ORDER — HALOPERIDOL LACTATE 5 MG/ML IJ SOLN
5.0000 mg | Freq: Once | INTRAMUSCULAR | Status: AC
  Administered 2018-10-12: 5 mg via INTRAVENOUS
  Filled 2018-10-12: qty 1

## 2018-10-12 MED ORDER — LORAZEPAM 2 MG/ML IJ SOLN
1.0000 mg | Freq: Once | INTRAMUSCULAR | Status: AC
  Administered 2018-10-12: 1 mg via INTRAVENOUS
  Filled 2018-10-12: qty 1

## 2018-10-12 NOTE — Evaluation (Signed)
Physical Therapy Evaluation Patient Details Name: Kyle Ball MRN: [id number] DOB: [birth year] Today's Date: 10/12/2018   History of Present Illness  OMIR COOPRIDER is a 73 y.o. male with medical history significant of recurrent UTI, Parkinsons, Crohns. Presents after fall off of his couch while trying to reach for his cellphone.  Clinical Impression  Pt admitted with above diagnosis. Pt currently with functional limitations due to the deficits listed below (see PT Problem List). Pt required min A to get fully to EOB. Unable to bear wt through L elbow due to pain, noted swelling. Min A on L side for pt to stand and at that point, condom cath ineffective and pt urinated on floor. Pivoted to chair with min A and use of RW. NT had difficulty getting new condom cath to stay on as well so ambulation deferred today. Pt wears Depends at home. At this point, not safe to d/c home alone, recommending ST SNF.  Pt will benefit from skilled PT to increase their independence and safety with mobility to allow discharge to the venue listed below.       Follow Up Recommendations CIR;Supervision for mobility/OOB    Equipment Recommendations  None recommended by PT    Recommendations for Other Services OT consult     Precautions / Restrictions Precautions Precautions: Fall Restrictions Weight Bearing Restrictions: No      Mobility  Bed Mobility Overal bed mobility: Needs Assistance Bed Mobility: Rolling;Sidelying to Sit Rolling: Min assist Sidelying to sit: Min assist;Mod assist       General bed mobility comments: rolled L with use of rail and min A to initiate and complete mvmt. SL to sit with min A but needed mod A to scoot L hip fwd to EOB due to painful L elbow.   Transfers Overall transfer level: Needs assistance Equipment used: Rolling walker (2 wheeled) Transfers: Sit to/from [facility] Sit to Stand: Min assist Stand pivot transfers: Min assist        General transfer comment: Min A L side for support of L elbow and power up to RW. Pt urinating in standing and could not control so could not progress ambulation today. Min A to pivot to chair with RW. Festinating stepping pattern noted.   Ambulation/Gait             General Gait Details: deferred due to inability of condom cath to stay on and frequent urination in standing   Stairs            Wheelchair Mobility    Modified Rankin (Stroke Patients Only)       Balance Overall balance assessment: Needs assistance Sitting-balance support: Single extremity supported;Feet supported Sitting balance-Leahy Scale: Fair     Standing balance support: Bilateral upper extremity supported Standing balance-Leahy Scale: Poor Standing balance comment: reliant on RW                             Pertinent Vitals/Pain Pain Assessment: Faces Faces Pain Scale: Hurts little more Pain Location: L elbow Pain Descriptors / Indicators: Grimacing;Guarding Pain Intervention(s): Limited activity within patient's tolerance;Monitored during session;Repositioned    Home Living Family/patient expects to be discharged to:: Private residence Living Arrangements: Alone Available Help at Discharge: Available PRN/intermittently Type of Home: House Home Access: Stairs to enter Entrance Stairs-Rails: None Entrance Stairs-Number of Steps: 3 Home Layout: One level Home Equipment: Cane - single point;Walker - 2 wheels Additional Comments: pt's ex wife  is in the neighborhood and he spends some of each day at her house but otherwise alone. Though she is planning to move away closer to her grandchildren soon.     Prior Function Level of Independence: Independent with assistive device(s)         Comments: sometimes uses cane, sometimes not. Reports that he is independent with self care but does not look like he has been able to clean self well. Does not drive, ex wife brings him food.       Hand Dominance        Extremity/Trunk Assessment   Upper Extremity Assessment Upper Extremity Assessment: LUE deficits/detail;RUE deficits/detail RUE Deficits / Details: RUE Parkinsonian tremor noted RUE Sensation: decreased proprioception RUE Coordination: decreased fine motor;decreased gross motor LUE Deficits / Details: hit elbow when he fell off couch, posterior swelling noted and pt could not bear wt through it to help scoot back in chair, recommend OT for further evaluation LUE Sensation: WNL LUE Coordination: decreased gross motor;decreased fine motor    Lower Extremity Assessment Lower Extremity Assessment: Generalized weakness    Cervical / Trunk Assessment Cervical / Trunk Assessment: Normal  Communication   Communication: Other (comment)(slowed speech)  Cognition Arousal/Alertness: Awake/alert Behavior During Therapy: Flat affect Overall Cognitive Status: No family/caregiver present to determine baseline cognitive functioning                                 General Comments: slow response time but appropriate and can answer PLOF questions, some tangential conversation noted. And mildly decreased self awareness as evidenced by urinating EOB and seemingly unaware (though had condom cath partially on so he may have though it was catching it).       General Comments      Exercises     Assessment/Plan    PT Assessment Patient needs continued PT services  PT Problem List Decreased strength;Decreased activity tolerance;Decreased balance;Decreased mobility;Decreased cognition;Decreased knowledge of use of DME;Decreased knowledge of precautions;Pain;Impaired tone       PT Treatment Interventions DME instruction;Gait training;Functional mobility training;Therapeutic activities;Therapeutic exercise;Balance training;Patient/family education;Neuromuscular re-education;Cognitive remediation    PT Goals (Current goals can be found in the Care Plan  section)  Acute Rehab PT Goals Patient Stated Goal: return home PT Goal Formulation: With patient Time For Goal Achievement: 10/26/18 Potential to Achieve Goals: Good    Frequency Min 3X/week   Barriers to discharge Decreased caregiver support lives alone    Co-evaluation               AM-PAC PT "6 Clicks" Mobility  Outcome Measure Help needed turning from your back to your side while in a flat bed without using bedrails?: A Little Help needed moving from lying on your back to sitting on the side of a flat bed without using bedrails?: A Little Help needed moving to and from a bed to a chair (including a wheelchair)?: A Lot Help needed standing up from a chair using your arms (e.g., wheelchair or bedside chair)?: A Lot Help needed to walk in hospital room?: A Lot Help needed climbing 3-5 steps with a railing? : Total 6 Click Score: 13    End of Session Equipment Utilized During Treatment: Gait belt Activity Tolerance: Patient tolerated treatment well Patient left: in chair;with call bell/phone within reach;with chair alarm set Nurse Communication: Mobility status PT Visit Diagnosis: Unsteadiness on feet (R26.81);History of falling (Z91.81);Muscle weakness (generalized) (M62.81);Difficulty in walking, not  elsewhere classified (R26.2);Pain Pain - Right/Left: Left Pain - part of body: Arm    Time: 1217-1300 PT Time Calculation (min) (ACUTE ONLY): 43 min   Charges:   PT Evaluation $PT Eval Moderate Complexity: 1 Mod PT Treatments $Therapeutic Activity: 8-22 mins $Neuromuscular Re-education: 8-22 mins        Leighton Roach, PT  Acute Rehab Services  Pager [phone] Office [place] 10/12/2018, 1:47 PM

## 2018-10-12 NOTE — Progress Notes (Signed)
PROGRESS NOTE    Kyle Ball  JHE:[id number] DOB: 1945/07/26 DOA: 10/13/2018 PCP: [name], MD    Brief Narrative:  Kyle Ball is a 73 y.o. male with medical history significant of recurrent UTI, Parkinsons, Crohns. Presents after fall off of his couch while trying to reach for his cellphone. He denies any LOC. He remembers the entire fall. He did fall on left elbow and has some pain associated.  Patient always have ambulatory dysfunction.  He was on the floor for more than 8 hours.  He was found to have acute rhabdomyolysis.   Assessment & Plan:   Principal Problem:   Rhabdomyolysis Active Problems:   Anxiety state   BPH (benign prostatic hyperplasia)   Parkinson's disease ([place])   Obesity   UTI (urinary tract infection)   Hypokalemia   Thrombocytopenia ([place])   Fall at home, initial encounter   Acute traumatic rhabdomyolysis: Some clinical improvement with treatment.  Continue IV fluids.  Renal functions are normal.  We will recheck levels tomorrow.  Encourage oral hydration.  Skeletal survey was negative.  Urinalysis was normal.  Physical deconditioning/advanced Parkinson's disease/debility: Patient with advanced physical debility.  Will work with PT OT.  She he will benefit with inpatient therapies for short-term.  Patient may need assisted living facility placement on the long-term.  Hypokalemia: We will replace with oral supplements.  Parkinson's disease: Patient on Sinemet at home.  He will continue.  Crohn's disease: Patient on chronic steroids.  He will continue.  Patient has severe systemic disease, continues to have elevated CK levels.  He will continue to need IV fluids.  He is anticipated to stay more than 2 midnights in the hospital.  Will change to inpatient.   DVT prophylaxis: Lovenox. Code Status: DNR. Family Communication: Patient's ex-wife who is also power of attorney for healthcare.  She is only caretaker for him.  Plan of care  discussed extensively with her. Disposition Plan: Skilled nursing facility.   Consultants:   None.  Procedures:   None.  Antimicrobials:   None.   Subjective: Patient was seen and examined.  Denies any complaints.  He says he is extremely weak.  No other overnight events.  Objective: Vitals:   10/20/2018 1715 11/05/2018 1802 10/29/2018 2239 10/12/18 0516  BP: 135/67 131/64 133/66 (!) 153/77  Pulse: 74 72 71 76  Resp: 19 18 16 16   Temp:  98.6 F (37 C) 98.2 F (36.8 C) 98.2 F (36.8 C)  TempSrc:  Oral Oral Oral  SpO2: 97% 100% 99% 97%  Weight:      Height:        Intake/Output Summary (Last 24 hours) at 10/12/2018 1313 Last data filed at 10/12/2018 0800 Gross per 24 hour  Intake 1418.27 ml  Output 1 ml  Net 1417.27 ml   Filed Weights   10/17/2018 1415  Weight: 104.3 kg    Examination:  General exam: Appears calm and comfortable, chronically sick looking. Respiratory system: Clear to auscultation. Respiratory effort normal. Cardiovascular system: S1 & S2 heard, RRR. No JVD, murmurs, rubs, gallops or clicks.  He has none pedal edema of the arms. Gastrointestinal system: Abdomen is nondistended, soft and nontender. No organomegaly or masses felt. Normal bowel sounds heard. Central nervous system: Alert and oriented. No focal neurological deficits.  Patient has resting tremor. Extremities: Symmetric 5 x 5 power.  Some localized ecchymotic area on the lateral aspect of the left leg. Skin:  lesions or ulcers.  Localized macular rashes on  the left leg probably contact dermatitis. Psychiatry: Judgement and insight appear normal. Mood & affect appropriate.     Data Reviewed: I have personally reviewed following labs and imaging studies  CBC: Recent Labs  Lab 10/21/2018 1425 10/26/2018 1839 10/12/18 0300  WBC 9.0 8.0 7.0  NEUTROABS 7.7  --   --   HGB 16.0 14.4 14.6  HCT 49.3 44.4 43.8  MCV 87.6 86.2 85.9  PLT 148* 153 332   Basic Metabolic Panel: Recent Labs  Lab  10/26/2018 1425 11/09/2018 1849 10/12/18 0300  NA 137  --  139  K 3.4*  --  3.3*  CL 101  --  108  CO2 22  --  22  GLUCOSE 114*  --  98  BUN 11  --  9  CREATININE 0.71 0.65 0.76  CALCIUM 8.6*  --  8.5*  MG  --  1.9 2.0   GFR: Estimated Creatinine Clearance: 110.6 mL/min (by C-G formula based on SCr of 0.76 mg/dL). Liver Function Tests: Recent Labs  Lab 10/22/2018 1425 10/12/18 0300  AST 48* 45*  ALT 27 8  ALKPHOS 72 64  BILITOT 1.2 1.3*  PROT 5.9* 5.3*  ALBUMIN 3.4* 2.9*   No results for input(s): LIPASE, AMYLASE in the last 168 hours. No results for input(s): AMMONIA in the last 168 hours. Coagulation Profile: No results for input(s): INR, PROTIME in the last 168 hours. Cardiac Enzymes: Recent Labs  Lab 11/05/2018 1425 10/12/18 0300  CKTOTAL 2,277* 1,508*   BNP (last 3 results) No results for input(s): PROBNP in the last 8760 hours. HbA1C: No results for input(s): HGBA1C in the last 72 hours. CBG: Recent Labs  Lab 10/20/2018 1422  GLUCAP 117*   Lipid Profile: No results for input(s): CHOL, HDL, LDLCALC, TRIG, CHOLHDL, LDLDIRECT in the last 72 hours. Thyroid Function Tests: No results for input(s): TSH, T4TOTAL, FREET4, T3FREE, THYROIDAB in the last 72 hours. Anemia Panel: No results for input(s): VITAMINB12, FOLATE, FERRITIN, TIBC, IRON, RETICCTPCT in the last 72 hours. Sepsis Labs: Recent Labs  Lab 10/20/2018 1425  LATICACIDVEN 1.4    Recent Results (from the past 240 hour(s))  Urine culture     Status: None   Collection Time: 10/31/2018  2:25 PM  Result Value Ref Range Status   Specimen Description URINE, RANDOM  Final   Special Requests   Final    NONE Performed at [facility], [place] [address] [place], [place] [address]    Culture   Final    Multiple bacterial morphotypes present, none predominant. Suggest appropriate recollection if clinically indicated.   Report Status 10/12/2018 FINAL  Final  Blood Culture (routine x 2)     Status: None  (Preliminary result)   Collection Time: 10/17/2018  2:42 PM  Result Value Ref Range Status   Specimen Description BLOOD SITE NOT SPECIFIED  Final   Special Requests   Final    BOTTLES DRAWN AEROBIC AND ANAEROBIC Blood Culture adequate volume   Culture   Final    NO GROWTH < 24 HOURS Performed at [facility], [address], [place], [place] [address]    Report Status PENDING  Incomplete  Blood Culture (routine x 2)     Status: None (Preliminary result)   Collection Time: 11/10/2018  2:59 PM  Result Value Ref Range Status   Specimen Description BLOOD LEFT HAND  Final   Special Requests   Final    BOTTLES DRAWN AEROBIC AND ANAEROBIC Blood Culture results  may not be optimal due to an inadequate volume of blood received in culture bottles   Culture   Final    NO GROWTH < 24 HOURS Performed at [facility] [address], [place], [place] [address]    Report Status PENDING  Incomplete         Radiology Studies: Dg Elbow Complete Left  Result Date: 10/16/2018 CLINICAL DATA:  Elbow pain, fall EXAM: LEFT ELBOW - COMPLETE 3+ VIEW COMPARISON:  None. FINDINGS: There is no evidence of fracture, dislocation, or joint effusion. There is no evidence of arthropathy or other focal bone abnormality. Soft tissues are unremarkable. IMPRESSION: Negative. Electronically Signed   By: Franchot Gallo M.D.   On: 10/30/2018 14:57   Ct Head Wo Contrast  Result Date: 11/05/2018 CLINICAL DATA:  Head trauma EXAM: CT HEAD WITHOUT CONTRAST CT CERVICAL SPINE WITHOUT CONTRAST TECHNIQUE: Multidetector CT imaging of the head and cervical spine was performed following the standard protocol without intravenous contrast. Multiplanar CT image reconstructions of the cervical spine were also generated. COMPARISON:  None. FINDINGS: CT HEAD FINDINGS Brain: No evidence of acute infarction, hemorrhage, hydrocephalus, extra-axial collection or mass lesion/mass effect. Mild periventricular white matter hypodensity.  Vascular: No hyperdense vessel or unexpected calcification. Skull: Normal. Negative for fracture or focal lesion. Sinuses/Orbits: No acute finding. Other: None. CT CERVICAL SPINE FINDINGS Alignment: Normal. Skull base and vertebrae: No acute fracture. No primary bone lesion or focal pathologic process. Soft tissues and spinal canal: No prevertebral fluid or swelling. No visible canal hematoma. Disc levels: Mild multilevel disc space height loss and osteophytosis. Upper chest: Negative. Other: None. IMPRESSION: 1.  No acute intracranial pathology. 2.  No fracture or static subluxation of the cervical spine. Electronically Signed   By: Eddie Candle M.D.   On: 11/07/2018 15:48   Ct Cervical Spine Wo Contrast  Result Date: 10/31/2018 CLINICAL DATA:  Head trauma EXAM: CT HEAD WITHOUT CONTRAST CT CERVICAL SPINE WITHOUT CONTRAST TECHNIQUE: Multidetector CT imaging of the head and cervical spine was performed following the standard protocol without intravenous contrast. Multiplanar CT image reconstructions of the cervical spine were also generated. COMPARISON:  None. FINDINGS: CT HEAD FINDINGS Brain: No evidence of acute infarction, hemorrhage, hydrocephalus, extra-axial collection or mass lesion/mass effect. Mild periventricular white matter hypodensity. Vascular: No hyperdense vessel or unexpected calcification. Skull: Normal. Negative for fracture or focal lesion. Sinuses/Orbits: No acute finding. Other: None. CT CERVICAL SPINE FINDINGS Alignment: Normal. Skull base and vertebrae: No acute fracture. No primary bone lesion or focal pathologic process. Soft tissues and spinal canal: No prevertebral fluid or swelling. No visible canal hematoma. Disc levels: Mild multilevel disc space height loss and osteophytosis. Upper chest: Negative. Other: None. IMPRESSION: 1.  No acute intracranial pathology. 2.  No fracture or static subluxation of the cervical spine. Electronically Signed   By: Eddie Candle M.D.   On: 10/26/2018  15:48   Dg Chest Port 1 View  Result Date: 10/30/2018 CLINICAL DATA:  Syncopal episode today. Atrial fibrillation. Weakness. EXAM: PORTABLE CHEST 1 VIEW COMPARISON:  None. FINDINGS: Heart size is normal. There is age related aortic atherosclerosis. The lungs are clear. The pulmonary vascularity is normal. No infiltrate, collapse or effusion. No acute bone finding. IMPRESSION: No active disease. Electronically Signed   By: Nelson Chimes M.D.   On: 11/03/2018 14:57        Scheduled Meds:  budesonide  9 mg Oral Daily   carbidopa-levodopa  1 tablet Oral TID   enoxaparin (LOVENOX)  injection  50 mg Subcutaneous Q24H   multivitamin with minerals  1 tablet Oral Daily   sodium chloride flush  3 mL Intravenous Q12H   tamsulosin  0.4 mg Oral QHS   Continuous Infusions:  sodium chloride 100 mL/hr at 10/20/2018 1828     LOS: 0 days    Time spent: 25 minutes    Barb Merino, MD [facility] Pager [phone]  If 7PM-7AM, please contact night-coverage [email or web address] Password [email or web address] 10/12/2018, 1:13 PM

## 2018-10-13 LAB — CK: Total CK: 887 U/L — ABNORMAL HIGH (ref 49–397)

## 2018-10-13 LAB — NOVEL CORONAVIRUS, NAA (HOSP ORDER, SEND-OUT TO REF LAB; TAT 18-24 HRS): SARS-CoV-2, NAA: NOT DETECTED

## 2018-10-13 NOTE — Progress Notes (Signed)
Patient ID: Kyle Ball, male   DOB: 03-16-1946, 73 y.o.   MRN: [id number] Patient has not been on restraints all day today. Moved from bed to chair.No issues being combative. Resting in bed now. Will continue to follow as needed.  Cathlean Marseilles Tamala Julian, MSN, RN, [employer]

## 2018-10-13 NOTE — Plan of Care (Signed)

## 2018-10-13 NOTE — Progress Notes (Signed)
PROGRESS NOTE    Kyle Ball  IOM:[id number] DOB: 02/05/1946 DOA: 10/23/2018 PCP: [name], MD    Brief Narrative:  Kyle Ball is a 73 y.o. male with medical history significant of recurrent UTI, Parkinsons, Crohns. Presented after fall off of his couch while trying to reach for his cellphone. He denies any LOC. He remembers the entire fall. He did fall on left elbow and has some pain associated. Patient always have ambulatory dysfunction.  He was on the floor for more than 8 hours.  He was found to have acute rhabdomyolysis.   Assessment & Plan:   Principal Problem:   Rhabdomyolysis Active Problems:   Anxiety state   BPH (benign prostatic hyperplasia)   Parkinson's disease ([place])   Obesity   UTI (urinary tract infection)   Hypokalemia   Thrombocytopenia ([place])   Fall at home, initial encounter   Acute traumatic rhabdomyolysis: clinical improvement with treatment.  Discontinue IV fluids.  Renal functions are normal. Encourage oral hydration.  Skeletal survey was negative.  Urinalysis was normal.  Physical deconditioning/advanced Parkinson's disease/debility: Patient with advanced physical debility.  Will work with PT OT.  She he will benefit with inpatient therapies for short-term.  Patient may need assisted living facility placement on the long-term.  Hypokalemia: replaced .   Parkinson's disease: Patient on Sinemet at home.  He will continue.  Crohn's disease: Patient on chronic steroids.  He will continue.  Delirium: Hospital-acquired delirium.  Discontinue IV fluids.  Discontinue telemetry monitor.  Frequent reorientation, fall precautions and delirium precautions.  Use Haldol as needed.  Advised to avoid nighttime blood draw as well as vitals.   DVT prophylaxis: Lovenox. Code Status: DNR. Family Communication: Patient's ex-wife who is also power of attorney for healthcare.  She is only caretaker for him.  Plan of care discussed extensively with her on  10/13/2018 Disposition Plan: Skilled nursing facility.   Consultants:   None.  Procedures:   None.  Antimicrobials:   None.   Subjective: Patient was seen and examined.  Denies any complaints.  Overnight events noted.  He was agitated in the middle of the night so was given a dose of Haldol. He is quiet and sleepy now.  Denied any complaints.  Objective: Vitals:   10/24/2018 2239 10/12/18 0516 10/12/18 1346 10/12/18 2041  BP: 133/66 (!) 153/77 (!) 142/75 (!) 160/81  Pulse: 71 76 75 75  Resp: 16 16 20 16   Temp: 98.2 F (36.8 C) 98.2 F (36.8 C) 98.4 F (36.9 C) 98.8 F (37.1 C)  TempSrc: Oral Oral Oral Oral  SpO2: 99% 97% 100% 97%  Weight:      Height:        Intake/Output Summary (Last 24 hours) at 10/13/2018 1211 Last data filed at 10/13/2018 1144 Gross per 24 hour  Intake 1121.06 ml  Output 600 ml  Net 521.06 ml   Filed Weights   10/15/2018 1415  Weight: 104.3 kg    Examination:  General exam: Appears calm and comfortable, chronically sick looking.Sleepy today. Respiratory system: Clear to auscultation. Respiratory effort normal. Cardiovascular system: S1 & S2 heard, RRR. No JVD, murmurs, rubs, gallops or clicks.  He has none pedal edema of the arms. Gastrointestinal system: Abdomen is nondistended, soft and nontender. No organomegaly or masses felt. Normal bowel sounds heard. Central nervous system: Alert and oriented. No focal neurological deficits.  Patient has resting tremor. Extremities: Symmetric 5 x 5 power.  Some localized ecchymotic area on the lateral aspect of  the left leg. Skin:  lesions or ulcers.  Localized macular rashes on the left leg probably contact dermatitis. Psychiatry: Judgement and insight appear normal. Mood & affect flat.    Data Reviewed: I have personally reviewed following labs and imaging studies  CBC: Recent Labs  Lab 10/26/2018 1425 10/21/2018 1839 10/12/18 0300  WBC 9.0 8.0 7.0  NEUTROABS 7.7  --   --   HGB 16.0 14.4 14.6    HCT 49.3 44.4 43.8  MCV 87.6 86.2 85.9  PLT 148* 153 419   Basic Metabolic Panel: Recent Labs  Lab 11/07/2018 1425 10/16/2018 1849 10/12/18 0300  NA 137  --  139  K 3.4*  --  3.3*  CL 101  --  108  CO2 22  --  22  GLUCOSE 114*  --  98  BUN 11  --  9  CREATININE 0.71 0.65 0.76  CALCIUM 8.6*  --  8.5*  MG  --  1.9 2.0   GFR: Estimated Creatinine Clearance: 110.6 mL/min (by C-G formula based on SCr of 0.76 mg/dL). Liver Function Tests: Recent Labs  Lab 10/25/2018 1425 10/12/18 0300  AST 48* 45*  ALT 27 8  ALKPHOS 72 64  BILITOT 1.2 1.3*  PROT 5.9* 5.3*  ALBUMIN 3.4* 2.9*   No results for input(s): LIPASE, AMYLASE in the last 168 hours. No results for input(s): AMMONIA in the last 168 hours. Coagulation Profile: No results for input(s): INR, PROTIME in the last 168 hours. Cardiac Enzymes: Recent Labs  Lab 11/04/2018 1425 10/12/18 0300 10/13/18 0232  CKTOTAL 2,277* 1,508* 887*   BNP (last 3 results) No results for input(s): PROBNP in the last 8760 hours. HbA1C: No results for input(s): HGBA1C in the last 72 hours. CBG: Recent Labs  Lab 11/02/2018 1422  GLUCAP 117*   Lipid Profile: No results for input(s): CHOL, HDL, LDLCALC, TRIG, CHOLHDL, LDLDIRECT in the last 72 hours. Thyroid Function Tests: No results for input(s): TSH, T4TOTAL, FREET4, T3FREE, THYROIDAB in the last 72 hours. Anemia Panel: No results for input(s): VITAMINB12, FOLATE, FERRITIN, TIBC, IRON, RETICCTPCT in the last 72 hours. Sepsis Labs: Recent Labs  Lab 11/08/2018 1425  LATICACIDVEN 1.4    Recent Results (from the past 240 hour(s))  Urine culture     Status: None   Collection Time: 10/20/2018  2:25 PM  Result Value Ref Range Status   Specimen Description URINE, RANDOM  Final   Special Requests   Final    NONE Performed at [facility], [place] [address], [place], [place] [address]    Culture   Final    Multiple bacterial morphotypes present, none predominant. Suggest appropriate  recollection if clinically indicated.   Report Status 10/12/2018 FINAL  Final  Blood Culture (routine x 2)     Status: None (Preliminary result)   Collection Time: 10/30/2018  2:42 PM  Result Value Ref Range Status   Specimen Description BLOOD SITE NOT SPECIFIED  Final   Special Requests   Final    BOTTLES DRAWN AEROBIC AND ANAEROBIC Blood Culture adequate volume   Culture   Final    NO GROWTH 2 DAYS Performed at [facility], [address], [place], [place] [address]    Report Status PENDING  Incomplete  Blood Culture (routine x 2)     Status: None (Preliminary result)   Collection Time: 11/03/2018  2:59 PM  Result Value Ref Range Status   Specimen Description BLOOD LEFT HAND  Final   Special  Requests   Final    BOTTLES DRAWN AEROBIC AND ANAEROBIC Blood Culture results may not be optimal due to an inadequate volume of blood received in culture bottles   Culture   Final    NO GROWTH 2 DAYS Performed at [facility], [place] [address], [place], [place] [address]    Report Status PENDING  Incomplete         Radiology Studies: Dg Elbow Complete Left  Result Date: 11/01/2018 CLINICAL DATA:  Elbow pain, fall EXAM: LEFT ELBOW - COMPLETE 3+ VIEW COMPARISON:  None. FINDINGS: There is no evidence of fracture, dislocation, or joint effusion. There is no evidence of arthropathy or other focal bone abnormality. Soft tissues are unremarkable. IMPRESSION: Negative. Electronically Signed   By: Franchot Gallo M.D.   On: 11/02/2018 14:57   Ct Head Wo Contrast  Result Date: 10/19/2018 CLINICAL DATA:  Head trauma EXAM: CT HEAD WITHOUT CONTRAST CT CERVICAL SPINE WITHOUT CONTRAST TECHNIQUE: Multidetector CT imaging of the head and cervical spine was performed following the standard protocol without intravenous contrast. Multiplanar CT image reconstructions of the cervical spine were also generated. COMPARISON:  None. FINDINGS: CT HEAD FINDINGS Brain: No evidence of acute infarction, hemorrhage,  hydrocephalus, extra-axial collection or mass lesion/mass effect. Mild periventricular white matter hypodensity. Vascular: No hyperdense vessel or unexpected calcification. Skull: Normal. Negative for fracture or focal lesion. Sinuses/Orbits: No acute finding. Other: None. CT CERVICAL SPINE FINDINGS Alignment: Normal. Skull base and vertebrae: No acute fracture. No primary bone lesion or focal pathologic process. Soft tissues and spinal canal: No prevertebral fluid or swelling. No visible canal hematoma. Disc levels: Mild multilevel disc space height loss and osteophytosis. Upper chest: Negative. Other: None. IMPRESSION: 1.  No acute intracranial pathology. 2.  No fracture or static subluxation of the cervical spine. Electronically Signed   By: Eddie Candle M.D.   On: 10/13/2018 15:48   Ct Cervical Spine Wo Contrast  Result Date: 10/25/2018 CLINICAL DATA:  Head trauma EXAM: CT HEAD WITHOUT CONTRAST CT CERVICAL SPINE WITHOUT CONTRAST TECHNIQUE: Multidetector CT imaging of the head and cervical spine was performed following the standard protocol without intravenous contrast. Multiplanar CT image reconstructions of the cervical spine were also generated. COMPARISON:  None. FINDINGS: CT HEAD FINDINGS Brain: No evidence of acute infarction, hemorrhage, hydrocephalus, extra-axial collection or mass lesion/mass effect. Mild periventricular white matter hypodensity. Vascular: No hyperdense vessel or unexpected calcification. Skull: Normal. Negative for fracture or focal lesion. Sinuses/Orbits: No acute finding. Other: None. CT CERVICAL SPINE FINDINGS Alignment: Normal. Skull base and vertebrae: No acute fracture. No primary bone lesion or focal pathologic process. Soft tissues and spinal canal: No prevertebral fluid or swelling. No visible canal hematoma. Disc levels: Mild multilevel disc space height loss and osteophytosis. Upper chest: Negative. Other: None. IMPRESSION: 1.  No acute intracranial pathology. 2.  No  fracture or static subluxation of the cervical spine. Electronically Signed   By: Eddie Candle M.D.   On: 11/03/2018 15:48   Dg Chest Port 1 View  Result Date: 10/31/2018 CLINICAL DATA:  Syncopal episode today. Atrial fibrillation. Weakness. EXAM: PORTABLE CHEST 1 VIEW COMPARISON:  None. FINDINGS: Heart size is normal. There is age related aortic atherosclerosis. The lungs are clear. The pulmonary vascularity is normal. No infiltrate, collapse or effusion. No acute bone finding. IMPRESSION: No active disease. Electronically Signed   By: Nelson Chimes M.D.   On: 10/16/2018 14:57        Scheduled Meds:  budesonide  9 mg  Oral Daily   carbidopa-levodopa  1 tablet Oral TID   enoxaparin (LOVENOX) injection  50 mg Subcutaneous Q24H   multivitamin with minerals  1 tablet Oral Daily   sodium chloride flush  3 mL Intravenous Q12H   tamsulosin  0.4 mg Oral QHS   Continuous Infusions:    LOS: 1 day    Time spent: 25 minutes    Barb Merino, MD [facility] Pager [phone]  If 7PM-7AM, please contact night-coverage [email or web address] Password [email or web address] 10/13/2018, 12:11 PM

## 2018-10-13 NOTE — Progress Notes (Signed)
   10/13/18 0800  Restraint Every 2 Hour Monitoring  Airway Clear with Spontaneous Respirations No (Document findings in assessment flowsheet)  Circulation / Skin Integrity No signs of injury  Emotional / Mental Status Patient asleep  Range of Motion Performed  Food and Fluids Fluid/Food offered  Elimination Patient declined  Patient's rights, dignity, safety maintained Yes  Can Restraints be Less Restrictive or Discontinued? Yes (Complete full restraint assessment)  Patient is currently sleeping and is not on any restraints

## 2018-10-13 NOTE — Progress Notes (Signed)
Patient became very agitated with staff while being cleaned. Began hitting and kicking staff, and throwing objects within reach at them. Also pinching and squeezing of staff extremities. Dr notified, orders received. Patient currently asleep.

## 2018-10-13 NOTE — Evaluation (Signed)
Occupational Therapy Evaluation Patient Details Name: Kyle Ball MRN: [id number] DOB: 08/13/1945 Today's Date: 10/13/2018    History of Present Illness Kyle Ball is a 73 y.o. male with medical history significant of recurrent UTI, Parkinsons, Crohns. Presents after fall off of his couch while trying to reach for his cellphone. He was found to have acute rhabdomyolysis.   Clinical Impression   PTA patient reports independent with ADLs, using cane at times for mobility.  Patient admitted for above and limited by problem list below, including generalized weakness, impaired balance, R UE tremor (hx of parkinson's), impaired cognition (orientation, awareness, problem solving), and decreased activity tolerance.  Patient requires mod assist for bed mobility, min assist for UB ADLs, total assist for LB ADLs, total assist +2 for toileting (after incontinent BM episode) and mod assist for toilet transfers. Patient will benefit from continued OT services while admitted and after dc at CIR level in order to maximize independence and safety upon dc home.  Will follow.     Follow Up Recommendations  CIR    Equipment Recommendations  3 in 1 bedside commode    Recommendations for Other Services Rehab consult     Precautions / Restrictions Precautions Precautions: Fall Restrictions Weight Bearing Restrictions: No      Mobility Bed Mobility Overal bed mobility: Needs Assistance Bed Mobility: Supine to Sit     Supine to sit: Mod assist     General bed mobility comments: mod assist for trunk support transitiong to EOB   Transfers Overall transfer level: Needs assistance Equipment used: Rolling walker (2 wheeled) Transfers: Sit to/from [facility] Sit to Stand: Mod assist Stand pivot transfers: Mod assist       General transfer comment: mod assist to power up into standing, for safety and balance with initial posterior lean requring multimodal cueing to  correct posture, sequencing and balance     Balance Overall balance assessment: Needs assistance Sitting-balance support: Single extremity supported;Feet supported Sitting balance-Leahy Scale: Fair     Standing balance support: Bilateral upper extremity supported;During functional activity Standing balance-Leahy Scale: Poor Standing balance comment: reliant on RW, posterior lean with multimodal cueing to correct                            ADL either performed or assessed with clinical judgement   ADL Overall ADL's : Needs assistance/impaired Eating/Feeding: Minimal assistance;Bed level Eating/Feeding Details (indicate cue type and reason): sitting to pour drink and bring to mouth without spilling  Grooming: Supervision/safety;Sitting Grooming Details (indicate cue type and reason): increased time and effort to brush teeth, manipuating items with supervsion   Upper Body Bathing: Set up;Sitting   Lower Body Bathing: Maximal assistance;+2 for physical assistance;Sit to/from stand Lower Body Bathing Details (indicate cue type and reason): (after incontient BM episode) Upper Body Dressing : Sitting;Minimal assistance   Lower Body Dressing: Total assistance;+2 for physical assistance;Sit to/from stand   Toilet Transfer: Moderate assistance;Stand-pivot;BSC;RW   Toileting- Clothing Manipulation and Hygiene: Total assistance;+2 for physical assistance;Sit to/from stand       Functional mobility during ADLs: Moderate assistance;Cueing for safety;Cueing for sequencing;Rolling walker General ADL Comments: pt limited by generalized weakness, impaired balance, and cognition      Vision   Vision Assessment?: No apparent visual deficits     Perception     Praxis      Pertinent Vitals/Pain Pain Assessment: No/denies pain     Hand Dominance Left  Extremity/Trunk Assessment Upper Extremity Assessment Upper Extremity Assessment: RUE deficits/detail;LUE  deficits/detail;Generalized weakness RUE Deficits / Details: RUE Parkinsonian tremor noted, grossly 3+/5 MMT RUE Sensation: decreased proprioception RUE Coordination: decreased fine motor;decreased gross motor LUE Deficits / Details: grossly 3+/5 MMT, denies elbow pain   LUE Sensation: WNL LUE Coordination: decreased gross motor;decreased fine motor   Lower Extremity Assessment Lower Extremity Assessment: Defer to PT evaluation   Cervical / Trunk Assessment Cervical / Trunk Assessment: Normal   Communication Communication Communication: Other (comment)(slowed speech)   Cognition Arousal/Alertness: Awake/alert Behavior During Therapy: Flat affect Overall Cognitive Status: Impaired/Different from baseline Area of Impairment: Orientation;Attention;Memory;Following commands;Safety/judgement;Problem solving;Awareness                 Orientation Level: Disoriented to;Place(reports [place], but reports in cafeteria ) Current Attention Level: Sustained Memory: Decreased recall of precautions;Decreased short-term memory Following Commands: Follows one step commands consistently;Follows one step commands with increased time Safety/Judgement: Decreased awareness of safety;Decreased awareness of deficits Awareness: Intellectual Problem Solving: Slow processing;Decreased initiation;Difficulty sequencing;Requires verbal cues General Comments: pt requires increased time to process and respond, poor awareness to deficits; orientation questionable as reports hes in the cafeteria    General Comments       Exercises     Shoulder Instructions      Home Living Family/patient expects to be discharged to:: Private residence Living Arrangements: Alone Available Help at Discharge: Available PRN/intermittently Type of Home: House Home Access: Stairs to enter [employer] of Steps: 3 Entrance Stairs-Rails: None Home Layout: One level     Bathroom Shower/Tub: Arts administrator: Standard     Home Equipment: Cane - single point;Walker - 2 wheels   Additional Comments: pt's ex wife is in the neighborhood and he spends some of each day at her house but otherwise alone. Though she is planning to move away closer to her grandchildren soon.       Prior Functioning/Environment Level of Independence: Independent with assistive device(s)        Comments: at times using cane for moibilty, reports independent ADLs, cooking/cleaning and managing meds; does not drive        OT Problem List: Decreased strength;Decreased activity tolerance;Impaired balance (sitting and/or standing);Decreased coordination;Decreased cognition;Decreased safety awareness;Decreased knowledge of use of DME or AE;Decreased knowledge of precautions      OT Treatment/Interventions: Self-care/ADL training;Therapeutic exercise;Neuromuscular education;DME and/or AE instruction;Therapeutic activities;Patient/family education;Balance training;Cognitive remediation/compensation    OT Goals(Current goals can be found in the care plan section) Acute Rehab OT Goals Patient Stated Goal: to get stronger OT Goal Formulation: With patient Time For Goal Achievement: 10/27/18 Potential to Achieve Goals: Good  OT Frequency: Min 3X/week   Barriers to D/C:            Co-evaluation              AM-PAC OT "6 Clicks" Daily Activity     Outcome Measure Help from another person eating meals?: A Little Help from another person taking care of personal grooming?: A Little Help from another person toileting, which includes using toliet, bedpan, or urinal?: Total Help from another person bathing (including washing, rinsing, drying)?: A Lot Help from another person to put on and taking off regular upper body clothing?: A Little Help from another person to put on and taking off regular lower body clothing?: Total 6 Click Score: 13   End of Session Equipment Utilized During Treatment:  Gait belt;Rolling walker Nurse Communication: Mobility status  Activity Tolerance:  Patient tolerated treatment well Patient left: in chair;with call bell/phone within reach;with chair alarm set;with nursing/sitter in room  OT Visit Diagnosis: Other abnormalities of gait and mobility (R26.89);Muscle weakness (generalized) (M62.81);Other symptoms and signs involving cognitive function                Time: 1425-1515 OT Time Calculation (min): 50 min Charges:  OT General Charges $OT Visit: 1 Visit OT Evaluation $OT Eval Moderate Complexity: 1 Mod OT Treatments $Self Care/Home Management : 23-37 mins  Delight Stare, OT Acute Rehabilitation Services Pager [phone] Office [phone]    Delight Stare 10/13/2018, 4:20 PM

## 2018-10-14 ENCOUNTER — Encounter (HOSPITAL_COMMUNITY): Payer: Self-pay | Admitting: Internal Medicine

## 2018-10-14 DIAGNOSIS — R5381 Other malaise: Secondary | ICD-10-CM | POA: Diagnosis present

## 2018-10-14 MED ORDER — MORPHINE SULFATE (PF) 2 MG/ML IV SOLN
INTRAVENOUS | Status: AC
  Administered 2018-10-14: 2 mg via INTRAVENOUS
  Filled 2018-10-14: qty 1

## 2018-10-14 MED ORDER — MORPHINE SULFATE (PF) 2 MG/ML IV SOLN: 2.0000 mg | Freq: Once | INTRAVENOUS | Status: AC

## 2018-10-14 MED FILL — Medication: Qty: 1 | Status: AC

## 2018-10-16 LAB — CULTURE, BLOOD (ROUTINE X 2)
Culture: NO GROWTH
Culture: NO GROWTH
Special Requests: ADEQUATE

## 2018-11-11 NOTE — Progress Notes (Signed)
Inpatient Rehabilitation Admissions Coordinator  Inpatient Rehab Consult received. I met with patient at the bedside for rehabilitation assessment. Patient lives alone and ex wife checks in intermittently. Pt will need SNF and then ALF. Not a candidate for inpt rehab admit at this time. I contacted SW, Percell Locus and we will sign off at this time. Pt is aware of my recommendations.  Danne Baxter, RN, MSN Rehab Admissions Coordinator [phone] 10/23/2018 1:11 PM

## 2018-11-11 NOTE — TOC Initial Note (Signed)
Transition of Care [facility]) - Initial/Assessment Note    Patient Details  Name: Kyle Ball MRN: [id number] Date of Birth: 06-26-1945  [facility] CM/SW Contact:    Benard Halsted, LCSW Phone Number: 10/27/18, 10:26 AM  Clinical Narrative:                 Luanna Cole Jacksonis a 73 y.o.malewith medical history significant ofrecurrent UTI, Parkinsons, Crohns. Presented after fall off of his couch while trying to reach for his cellphone.  CSW received consult for possible SNF placement at time of discharge if CIR is unable to accept patient. CSW spoke with patient's POA, Barnetta Chapel (ex wife but only contact person due to patient being estranged from his children) regarding PT recommendation of SNF placement at time of discharge. Barnetta Chapel reported that she is currently unable to care for patient at their home given patient's current physical needs and fall risk. Barnetta Chapel expressed understanding of PT recommendation and is agreeable to SNF placement at time of discharge. Patient reports preference for [facility] but understands that they are frequently full. She does not want patient to go back to [place] as he did a year ago. CSW discussed insurance authorization process and provided Medicare SNF ratings list. Barnetta Chapel expressed being hopeful for patient to rehab and to feel better soon. No further questions reported at this time. CSW to continue to follow and assist with discharge planning needs.   Expected Discharge Plan: Skilled Nursing Facility Barriers to Discharge: Continued Medical Work up   Patient Goals and CMS Choice Patient states their goals for this hospitalization and ongoing recovery are:: Rehab CMS Medicare.gov Compare Post Acute Care list provided to:: Other (Comment Required)(POA, Barnetta Chapel) Choice offered to / list presented to : [place] / Guardian  Expected Discharge Plan and Services Expected Discharge Plan: [place] In-house Referral:  Clinical Social Work Discharge Planning Services: NA Post Acute Care Choice: [place] Living arrangements for the past 2 months: Single Family Home                 DME Arranged: N/A DME Agency: NA       HH Arranged: NA HH Agency: NA        Prior Living Arrangements/Services Living arrangements for the past 2 months: [place] Lives with:: Self Patient language and need for interpreter reviewed:: Yes Do you feel safe going back to the place where you live?: No   Lives alone  Need for Family Participation in Patient Care: Yes (Comment) Care giver support system in place?: Yes (comment) Current home services: DME Criminal Activity/Legal Involvement Pertinent to Current Situation/Hospitalization: No - Comment as needed  Activities of Daily Living      Permission Sought/Granted Permission sought to share information with : Facility Sport and exercise psychologist, Family Supports Permission granted to share information with : Yes, Verbal Permission Granted  Share Information with NAME: Barnetta Chapel  Permission granted to share info w AGENCY: SNFs  Permission granted to share info w Relationship: Ex-wife, POA  Permission granted to share info w Contact Information: [phone]  Emotional Assessment Appearance:: Appears stated age Attitude/Demeanor/Rapport: Unable to Assess Affect (typically observed): Unable to Assess Orientation: : Oriented to Self, Oriented to Place Alcohol / Substance Use: Not Applicable Psych Involvement: No (comment)  Admission diagnosis:  Generalized weakness [R53.1] Traumatic rhabdomyolysis, initial encounter ([place]) [T79.6XXA] Patient Active Problem List   Diagnosis Date Noted  . Rhabdomyolysis 10/27/2018  . Hypokalemia 10/27/2018  . Thrombocytopenia ([place]) 10/27/2018  .  Fall at home, initial encounter 10/13/2018  . Cyst of pancreas 03/05/2018  . BPH with obstruction/lower urinary tract symptoms 01/23/2018  . Acute lower UTI  10/27/2017  . UTI (urinary tract infection) 10/27/2017  . Medicare annual wellness visit, subsequent 12/04/2016  . Obesity 12/04/2016  . Hyperglycemia 02/04/2015  . Elevated blood pressure reading without diagnosis of hypertension 03/05/2013  . Nonspecific abnormal electrocardiogram (ECG) (EKG) 08/31/2011  . Parkinson's disease ([place]) 06/21/2010  . Anxiety state 07/01/2007  . BPH (benign prostatic hyperplasia) 07/01/2007  . HYPERLIPIDEMIA 04/10/2007  . DISORDER, DYSMETABOLIC SYNDROME X 76/80/8811  . DISORDER, DEPRESSIVE NEC 04/10/2007  . ELEVATED PROSTATE SPECIFIC ANTIGEN 04/10/2007  . History of non anemic vitamin B12 deficiency 10/26/2006  . ERECTILE DYSFUNCTION 10/26/2006  . Crohn's disease of small intestine with intestinal obstruction ([place]) 10/26/2006   PCP:  [name], MD Pharmacy:   [facility] [address], [place] AT [address] [place] [address] Phone: [phone] Fax: [phone]     Social Determinants of Health (SDOH) Interventions    Readmission Risk Interventions Readmission Risk Prevention Plan 10-27-18  Post Dischage Appt Complete  Medication Screening Complete  Transportation Screening Complete  Some recent data might be hidden

## 2018-11-11 NOTE — Progress Notes (Signed)
Patient's ex-wife here, able to see patient, speak to MD Ghimire. She called patient's son, Pilar Plate, who will notify pt's other son. Ex-wife heading to patient's house to get notebook of arrangements.  Per post-mortem pathway, I called [facility]. Spoke with [employer]. She said patient is a possible tissue donor, number in flowchart, #[id number]. Eye prep completed.

## 2018-11-11 NOTE — Progress Notes (Signed)
Called pt's ex-wife (who is contact), Barnetta Chapel. Updated her on pt's status. She shared that while they are divorced, she is "all he has left" as he is estranged from his children. She shared that his son is in 'drug rehab'. She is concerned for the patient to be by himself as she has noticed his strength decreasing over the past 2 months. She shared that she thought they might have to sell his house to have money for an independent living facility or something of that nature. She was appreciative of the call.

## 2018-11-11 NOTE — Death Summary Note (Signed)
Death Summary  Kyle Ball GLO:[id number] DOB: 13-Mar-1946 DOA: Oct 31, 2018  PCP: [name], MD PCP/Office notified: none   Admit date: 10/31/18 Date of Death: Nov 03, 2018, 1440 PM  Final Diagnoses:  Principal Problem:   Rhabdomyolysis Active Problems:   Anxiety state   BPH (benign prostatic hyperplasia)   Parkinson's disease ([place])   Obesity   UTI (urinary tract infection)   Hypokalemia   Thrombocytopenia ([place])   Fall at home, initial encounter   Physical debility  Hospital Course and death Summary:  Patient was 73 year old gentleman with extensive medical problems, debility and recurrent UTIs, Parkinson's disease and Crohn's disease on chronic steroids.  He has been in poor health for some time with fall and ambulatory dysfunction's.  Patient was admitted to the hospital after he was found on the floor where he fell off the couch while trying to reach for his cell phone.  He was on the floor for 8 hours and was found by his ex-wife.  On initial evaluation, skeletal survey was negative he was found to have acute rhabdomyolysis with CK level of 2277.  Patient was admitted to the hospital.  He was treated with IV fluids.  He had gradual improvement of symptoms as well as improvement of his serum levels.  Patient with extreme debility, he was working with therapies every day at the hospital. On 11/03/18, he was at his usual state of health when he started working with physical therapy.  After ambulating from bed to the door, he became unresponsive and lost his pulse.  Patient was immediately put back on his bed and started on oxygen and supportive measures.  Patient was DNR, he had agonal breathing and nonpalpable pulse.  Patient was given 2 mg of morphine for agonal breathing.  While doing supportive treatment, he died at 64 on 11/03/2018.  Patient's ex-wife who is also healthcare power of attorney was notified and she was able to see him in the hospital.  Cause of death: Cardiac  arrest Physical debility and deconditioning Parkinson's disease     Pronounced dead with no heart beat, no spontaneous respiration, pupils fixed and dilated. Time: 1440 PM   Signed:  Barb Merino  [facility] 11/03/18, 2:45 PM

## 2018-11-11 NOTE — NC FL2 (Signed)
[place] LEVEL OF CARE SCREENING TOOL     IDENTIFICATION  Patient Name: Kyle Ball Birthdate: 1945/10/10 Sex: male Admission Date (Current Location): 11/06/2018  [facility] and [place] Number:  Herbalist and Address:  [facility]. [facility], [place] [address] [place], [place] [address]      Provider Number: [id number]  Attending Physician Name and Address:  Barb Merino, MD  Relative Name and Phone Number:  Jonette Mate [phone]    Current Level of Care: Hospital Recommended Level of Care: [place] Prior Approval Number:    Date Approved/Denied:   PASRR Number: [id number]  Discharge Plan: SNF    Current Diagnoses: Patient Active Problem List   Diagnosis Date Noted  . Rhabdomyolysis 10/21/2018  . Hypokalemia 11/08/2018  . Thrombocytopenia ([place]) 10/26/2018  . Fall at home, initial encounter 11/07/2018  . Cyst of pancreas 03/05/2018  . BPH with obstruction/lower urinary tract symptoms 01/23/2018  . Acute lower UTI 10/27/2017  . UTI (urinary tract infection) 10/27/2017  . Medicare annual wellness visit, subsequent 12/04/2016  . Obesity 12/04/2016  . Hyperglycemia 02/04/2015  . Elevated blood pressure reading without diagnosis of hypertension 03/05/2013  . Nonspecific abnormal electrocardiogram (ECG) (EKG) 08/31/2011  . Parkinson's disease ([place]) 06/21/2010  . Anxiety state 07/01/2007  . BPH (benign prostatic hyperplasia) 07/01/2007  . HYPERLIPIDEMIA 04/10/2007  . DISORDER, DYSMETABOLIC SYNDROME X 38/18/2993  . DISORDER, DEPRESSIVE NEC 04/10/2007  . ELEVATED PROSTATE SPECIFIC ANTIGEN 04/10/2007  . History of non anemic vitamin B12 deficiency 10/26/2006  . ERECTILE DYSFUNCTION 10/26/2006  . Crohn's disease of small intestine with intestinal obstruction ([place]) 10/26/2006    Orientation RESPIRATION BLADDER Height & Weight     Self, Place  Normal Incontinent, External catheter Weight: 230 lb (104.3  kg) Height:  6\' 2"  (188 cm)  BEHAVIORAL SYMPTOMS/MOOD NEUROLOGICAL BOWEL NUTRITION STATUS      Continent Diet(Please see DC Summary)  AMBULATORY STATUS COMMUNICATION OF NEEDS Skin   Extensive Assist Verbally Other (Comment)(Wound on chest)                       Personal Care Assistance Level of Assistance  Bathing, Feeding, Dressing Bathing Assistance: Maximum assistance Feeding assistance: Limited assistance Dressing Assistance: Limited assistance     Functional Limitations Info  Sight, Hearing, Speech Sight Info: Impaired Hearing Info: Adequate Speech Info: Adequate    SPECIAL CARE FACTORS FREQUENCY  PT (By licensed PT), OT (By licensed OT)     PT Frequency: 5x OT Frequency: 3x            Contractures Contractures Info: Not present    Additional Factors Info  Code Status, Allergies Code Status Info: DNR Allergies Info: Penicillins           Current Medications (11-04-18):  This is the current hospital active medication list Current Facility-Administered Medications  Medication Dose Route Frequency Provider Last Rate Last Dose  . budesonide (ENTOCORT EC) 24 hr capsule 9 mg  9 mg Oral Daily Kyle, Tyrone A, DO   9 mg at Nov 04, 2018 0944  . carbidopa-levodopa (SINEMET IR) 25-250 MG per tablet immediate release 1 tablet  1 tablet Oral TID Cherylann Ratel A, DO   1 tablet at 2018/11/04 0942  . enoxaparin (LOVENOX) injection 50 mg  50 mg Subcutaneous Q24H Kyle, Tyrone A, DO   50 mg at 10/13/18 2136  . multivitamin with minerals tablet 1 tablet  1 tablet Oral Daily Kyle, Tyrone A, DO  1 tablet at 2018/10/20 0944  . sodium chloride flush (NS) 0.9 % injection 3 mL  3 mL Intravenous Q12H Kyle, Tyrone A, DO   3 mL at 20-Oct-2018 0948  . tamsulosin (FLOMAX) capsule 0.4 mg  0.4 mg Oral QHS Kyle, Tyrone A, DO   0.4 mg at 10/13/18 2136     Discharge Medications: Please see discharge summary for a list of discharge medications.  Relevant Imaging Results:  Relevant Lab  Results:   Additional Information SS# [id number]     COVID negative on 10/12/18  Benard Halsted, LCSW

## 2018-11-11 NOTE — Progress Notes (Signed)
Physical Therapy Treatment Patient Details Name: Kyle Ball MRN: [id number] DOB: 05-Apr-1946 Today's Date: Oct 29, 2018    History of Present Illness [name] is a 73 y.o. male with medical history significant of recurrent UTI, Parkinsons, Crohns. Presents after fall off of his couch while trying to reach for his cellphone. He was found to have acute rhabdomyolysis.    PT Comments    Pt ambulated 8' with RW and min A+2 for equipment at which point he stopped walking and when asked if he was tired and wanted to sit, replied "yes". After sitting, pt became unresponsive and then head tipped back and pt began agonal breathing pattern and became cyanotic. Code called and team responded. See rapid notes. PT signing off at this point.   Follow Up Recommendations        Equipment Recommendations       Recommendations for Other Services       Precautions / Restrictions Precautions Precautions: Fall Restrictions Weight Bearing Restrictions: No    Mobility  Bed Mobility Overal bed mobility: Needs Assistance Bed Mobility: Supine to Sit Rolling: Min assist Sidelying to sit: Mod assist       General bed mobility comments: min A for rolling and LE's off bed, mod A for trunk elevation and max A to scoot hips to EOB  Transfers Overall transfer level: Needs assistance Equipment used: Rolling walker (2 wheeled) Transfers: Sit to/from Stand Sit to Stand: Min assist         General transfer comment: pt stood from bed with min A for fwd translation and to steady while he switched hands from bed to RW  Ambulation/Gait Ambulation/Gait assistance: Min Web designer (Feet): 8 Feet Assistive device: Rolling walker (2 wheeled) Gait Pattern/deviations: Festinating;Decreased stride length;Step-through pattern;Trunk flexed     General Gait Details: pt ambulated 8' and then stopped and stopped talking. Asked if he would like to sit down and he sais "yes". Chair brought  behind pt and he was assisted to sitting with mod A. Once sitting pt not responsive and then tipped head back with agonal pattern breathing. Code called and team arrived.    Stairs             Wheelchair Mobility    Modified Rankin (Stroke Patients Only)       Balance Overall balance assessment: Needs assistance Sitting-balance support: Single extremity supported;Feet supported Sitting balance-Leahy Scale: Fair     Standing balance support: Bilateral upper extremity supported;During functional activity Standing balance-Leahy Scale: Poor Standing balance comment: reliant on RW                            Cognition Arousal/Alertness: Awake/alert Behavior During Therapy: Flat affect Overall Cognitive Status: No family/caregiver present to determine baseline cognitive functioning Area of Impairment: Attention;Memory;Following commands;Safety/judgement;Problem solving;Awareness                   Current Attention Level: Sustained Memory: Decreased recall of precautions;Decreased short-term memory Following Commands: Follows one step commands consistently;Follows one step commands with increased time Safety/Judgement: Decreased awareness of safety;Decreased awareness of deficits Awareness: Intellectual Problem Solving: Slow processing;Decreased initiation;Difficulty sequencing;Requires verbal cues General Comments: pt oriented today and more conversant      Exercises      General Comments        Pertinent Vitals/Pain Pain Assessment: No/denies pain    Home Living  Prior Function            PT Goals (current goals can now be found in the care plan section) Acute Rehab PT Goals Patient Stated Goal: to get stronger Progress towards PT goals: (goals d/c'ed)    Frequency           PT Plan Discharge plan needs to be updated    Co-evaluation              AM-PAC PT "6 Clicks" Mobility   Outcome  Measure  Help needed turning from your back to your side while in a flat bed without using bedrails?: A Little Help needed moving from lying on your back to sitting on the side of a flat bed without using bedrails?: A Little Help needed moving to and from a bed to a chair (including a wheelchair)?: A Lot Help needed standing up from a chair using your arms (e.g., wheelchair or bedside chair)?: A Lot Help needed to walk in hospital room?: A Lot Help needed climbing 3-5 steps with a railing? : Total 6 Click Score: 13    End of Session Equipment Utilized During Treatment: Gait belt Activity Tolerance: Other (comment)(see above) Patient left: in bed;with nursing/sitter in room Nurse Communication: (lack of responsiveness) PT Visit Diagnosis: Unsteadiness on feet (R26.81);History of falling (Z91.81);Muscle weakness (generalized) (M62.81);Difficulty in walking, not elsewhere classified (R26.2)     Time: 5945-8592 PT Time Calculation (min) (ACUTE ONLY): 18 min  Charges:  $Gait Training: 8-22 mins                     Leighton Roach, PT  Acute Rehab Services  Pager [phone] Office [place] Oct 26, 2018, 2:29 PM

## 2018-11-11 NOTE — TOC Progression Note (Signed)
Transition of Care [facility]) - Progression Note    Patient Details  Name: Kyle Ball MRN: [id number] Date of Birth: 23-Sep-1945  [facility] of [facility]) CM/SW [place], LCSW Phone Number: Oct 16, 2018, 2:26 PM  Clinical Narrative:    Patient's POA has selected [facility] and patient is in agreement to go. CSW awaiting response from [facility] on bed availability for tomorrow.    Expected Discharge Plan: [place] Barriers to Discharge: Continued Medical Work up  Expected Discharge Plan and Services Expected Discharge Plan: [place] In-house Referral: Clinical Social Work Discharge Planning Services: NA Post Acute Care Choice: [place] Living arrangements for the past 2 months: Single Family Home                 DME Arranged: N/A DME Agency: NA       HH Arranged: NA HH Agency: NA         Social Determinants of Health (SDOH) Interventions    Readmission Risk Interventions Readmission Risk Prevention Plan 2018-10-16  Post Dischage Appt Complete  Medication Screening Complete  Transportation Screening Complete  Some recent data might be hidden

## 2018-11-11 NOTE — Progress Notes (Signed)
10/15/2018 1315 Pt's family came to pick up belongings. I released bag of belongings containing a phone charger cable, some clothes, and a cell phone identical to the one I placed in that bag after patient's death. I released belongings to Pilar Plate, positively identified as patient's son via phone with Barnetta Chapel, pt's ex wife and contact on file.

## 2018-11-11 NOTE — Progress Notes (Signed)
PROGRESS NOTE    Kyle Ball  NWG:[id number] DOB: [birth year] DOA: 10/24/2018 PCP: [name], MD    Brief Narrative:  DYWANE Ball is a 73 y.o. male with medical history significant of recurrent UTI, Parkinsons, Crohns. Presented after fall off of his couch while trying to reach for his cellphone. He denies any LOC. He remembers the entire fall. He did fall on left elbow and has some pain associated. Patient always have ambulatory dysfunction.  He was on the floor for more than 8 hours.  He was found to have acute rhabdomyolysis. Has progressive physical debility and deconditioning.    Assessment & Plan:   Principal Problem:   Rhabdomyolysis Active Problems:   Anxiety state   BPH (benign prostatic hyperplasia)   Parkinson's disease ([place])   Obesity   UTI (urinary tract infection)   Hypokalemia   Thrombocytopenia ([place])   Fall at home, initial encounter   Acute traumatic rhabdomyolysis: clinical improvement with treatment.  Treated with IV fluids. Renal functions are normal. Encourage oral hydration.  Skeletal survey was negative.  Urinalysis was normal.  Physical deconditioning/advanced Parkinson's disease/debility: Patient with advanced physical debility.  Will work with PT OT.  he will benefit with inpatient therapies for short-term.  Patient will need assisted living facility placement on the long-term.  Hypokalemia: replaced and improved.  Parkinson's disease: Patient on Sinemet at home.  He will continue.  Crohn's disease: Patient on chronic steroids.  He will continue.  Delirium: Hospital-acquired delirium.  Improving. Frequent reorientation, fall precautions and delirium precautions.  Use Haldol as needed.  Advised to avoid nighttime blood draw as well as vitals.   DVT prophylaxis: Lovenox. Code Status: DNR. Family Communication: Patient's ex-wife who is also power of attorney for healthcare.  She is only caretaker for him.  Plan of care discussed  extensively with her on 10/13/2018 Disposition Plan: Skilled nursing facility.   Consultants:   None.  Procedures:   None.  Antimicrobials:   None.   Subjective: Patient was seen and examined.  Denies any complaints.  No overnight events.  No delirium overnight.  Objective: Vitals:   10/12/18 2041 10/13/18 1327 10/13/18 2206 11/04/18 0521  BP: (!) 160/81 (!) 160/84 (!) 146/76 (!) 150/81  Pulse: 75 81 66 65  Resp: 16 15    Temp: 98.8 F (37.1 C) 97.9 F (36.6 C) 98.3 F (36.8 C) (!) 97.5 F (36.4 C)  TempSrc: Oral Oral Oral Oral  SpO2: 97% 96% 95% 97%  Weight:      Height:        Intake/Output Summary (Last 24 hours) at 11/04/2018 1149 Last data filed at 11-04-18 0815 Gross per 24 hour  Intake 240 ml  Output 400 ml  Net -160 ml   Filed Weights   11/05/2018 1415  Weight: 104.3 kg    Examination:  General exam: Appears calm and comfortable, chronically sick looking.  Respiratory system: Clear to auscultation. Respiratory effort normal. Cardiovascular system: S1 & S2 heard, RRR. No JVD, murmurs, rubs, gallops or clicks.  He has none pedal edema of the arms. Gastrointestinal system: Abdomen is nondistended, soft and nontender. No organomegaly or masses felt. Normal bowel sounds heard. Central nervous system: Alert and oriented. No focal neurological deficits.  Patient has resting tremor. Extremities: Symmetric 5 x 5 power.  Some localized ecchymotic area on the lateral aspect of the left leg. Skin:  lesions or ulcers.  Localized macular rashes on the left leg probably contact dermatitis. Psychiatry: Judgement and  insight appear normal. Mood & affect flat. AAO x 3.    Data Reviewed: I have personally reviewed following labs and imaging studies  CBC: Recent Labs  Lab 11/09/2018 1425 10/22/2018 1839 10/12/18 0300  WBC 9.0 8.0 7.0  NEUTROABS 7.7  --   --   HGB 16.0 14.4 14.6  HCT 49.3 44.4 43.8  MCV 87.6 86.2 85.9  PLT 148* 153 892   Basic Metabolic Panel:  Recent Labs  Lab 10/20/2018 1425 10/26/2018 1849 10/12/18 0300  NA 137  --  139  K 3.4*  --  3.3*  CL 101  --  108  CO2 22  --  22  GLUCOSE 114*  --  98  BUN 11  --  9  CREATININE 0.71 0.65 0.76  CALCIUM 8.6*  --  8.5*  MG  --  1.9 2.0   GFR: Estimated Creatinine Clearance: 110.6 mL/min (by C-G formula based on SCr of 0.76 mg/dL). Liver Function Tests: Recent Labs  Lab 10/19/2018 1425 10/12/18 0300  AST 48* 45*  ALT 27 8  ALKPHOS 72 64  BILITOT 1.2 1.3*  PROT 5.9* 5.3*  ALBUMIN 3.4* 2.9*   No results for input(s): LIPASE, AMYLASE in the last 168 hours. No results for input(s): AMMONIA in the last 168 hours. Coagulation Profile: No results for input(s): INR, PROTIME in the last 168 hours. Cardiac Enzymes: Recent Labs  Lab 10/22/2018 1425 10/12/18 0300 10/13/18 0232  CKTOTAL 2,277* 1,508* 887*   BNP (last 3 results) No results for input(s): PROBNP in the last 8760 hours. HbA1C: No results for input(s): HGBA1C in the last 72 hours. CBG: Recent Labs  Lab 10/13/2018 1422  GLUCAP 117*   Lipid Profile: No results for input(s): CHOL, HDL, LDLCALC, TRIG, CHOLHDL, LDLDIRECT in the last 72 hours. Thyroid Function Tests: No results for input(s): TSH, T4TOTAL, FREET4, T3FREE, THYROIDAB in the last 72 hours. Anemia Panel: No results for input(s): VITAMINB12, FOLATE, FERRITIN, TIBC, IRON, RETICCTPCT in the last 72 hours. Sepsis Labs: Recent Labs  Lab 11/07/2018 1425  LATICACIDVEN 1.4    Recent Results (from the past 240 hour(s))  Urine culture     Status: None   Collection Time: 10/20/2018  2:25 PM  Result Value Ref Range Status   Specimen Description URINE, RANDOM  Final   Special Requests   Final    NONE Performed at [facility], [place] [address], [place], [place] [address]    Culture   Final    Multiple bacterial morphotypes present, none predominant. Suggest appropriate recollection if clinically indicated.   Report Status 10/12/2018 FINAL  Final  Blood  Culture (routine x 2)     Status: None (Preliminary result)   Collection Time: 10/27/2018  2:42 PM  Result Value Ref Range Status   Specimen Description BLOOD SITE NOT SPECIFIED  Final   Special Requests   Final    BOTTLES DRAWN AEROBIC AND ANAEROBIC Blood Culture adequate volume   Culture   Final    NO GROWTH 3 DAYS Performed at [facility], [address] [place], [place] [address]    Report Status PENDING  Incomplete  Blood Culture (routine x 2)     Status: None (Preliminary result)   Collection Time: 10/17/2018  2:59 PM  Result Value Ref Range Status   Specimen Description BLOOD LEFT HAND  Final   Special Requests   Final    BOTTLES DRAWN AEROBIC AND ANAEROBIC Blood Culture results may not be optimal due  to an inadequate volume of blood received in culture bottles   Culture   Final    NO GROWTH 3 DAYS Performed at [facility], [place] [address] [place], [place] [address]    Report Status PENDING  Incomplete  Novel Coronavirus, NAA (hospital order; send-out to ref lab)     Status: None   Collection Time: 10/12/18  1:36 PM  Result Value Ref Range Status   SARS-CoV-2, NAA NOT DETECTED NOT DETECTED Final    Comment: (NOTE) This test was developed and its performance characteristics determined by [employer]. This test has not been FDA cleared or approved. This test has been authorized by FDA under an Emergency Use Authorization (EUA). This test is only authorized for the duration of time the declaration that circumstances exist justifying the authorization of the emergency use of in vitro diagnostic tests for detection of SARS-CoV-2 virus and/or diagnosis of COVID-19 infection under section 564(b)(1) of the Act, 21 U.S.C. 176HYW-7(P)(7), unless the authorization is terminated or revoked sooner. When diagnostic testing is negative, the possibility of a false negative result should be considered in the context of a patient's recent exposures and the presence of  clinical signs and symptoms consistent with COVID-19. An individual without symptoms of COVID-19 and who is not shedding SARS-CoV-2 virus would expect to have a negative (not detected) result in this assay. Performed  At: [facility] [address] [place], [place] [id number] Rush Farmer MD IO:[id number]    [place]  Final    Comment: Performed at [facility], [place] [address], [place], [place] [address]         Radiology Studies: No results found.      Scheduled Meds: . budesonide  9 mg Oral Daily  . carbidopa-levodopa  1 tablet Oral TID  . enoxaparin (LOVENOX) injection  50 mg Subcutaneous Q24H  . multivitamin with minerals  1 tablet Oral Daily  . sodium chloride flush  3 mL Intravenous Q12H  . tamsulosin  0.4 mg Oral QHS   Continuous Infusions:    LOS: 2 days    Time spent: 25 minutes    Barb Merino, MD [facility] Pager [phone]  If 7PM-7AM, please contact night-coverage [email or web address] Password [email or web address] 11/03/2018, 11:49 AM

## 2018-11-11 DEATH — deceased

## 2018-12-15 IMAGING — CT CT ABD-PELV W/ CM
2 of 5 series · 15 of 46 positions shown, 17 images · IV contrast (ISOVUE)
Comparison: 04/05/2011 and 06/24/2004 CTs

CLINICAL DATA: 71-year-old male with acute abdominal pain and
distension today. History of urinary retention and Foley catheter.

EXAM:
CT ABDOMEN AND PELVIS WITH CONTRAST
TECHNIQUE: Multidetector CT imaging of the abdomen and pelvis was performed
using the standard protocol following bolus administration of
intravenous contrast.
CONTRAST:  100mL 4IL3K4-AAA IOPAMIDOL (4IL3K4-AAA) INJECTION 61%

[Series 2: axial st · axial · 0.95mm/px · z∈[-630,-170]mm · 12 of 108 slices shown, 14 images]
[im 8/108  soft-tissue]
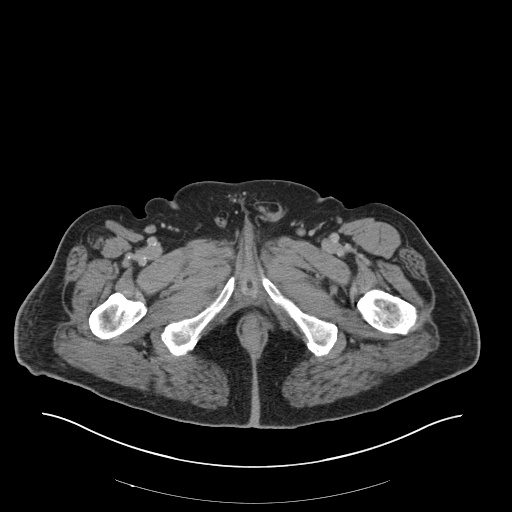
[im 8/108  bone]
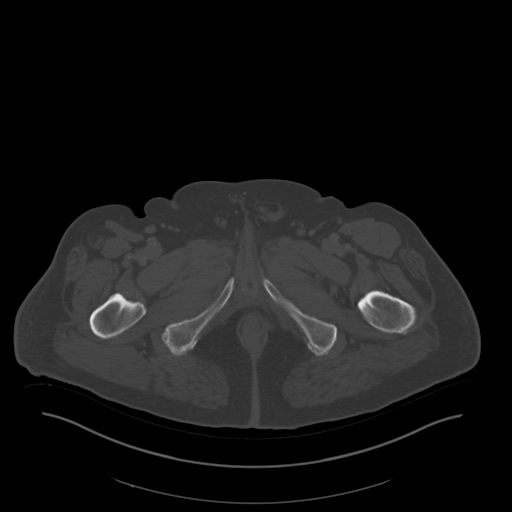
[im 15/108  soft-tissue]
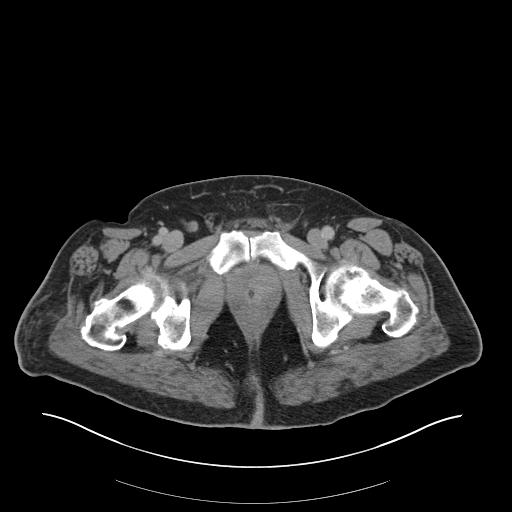
[im 22/108  soft-tissue]
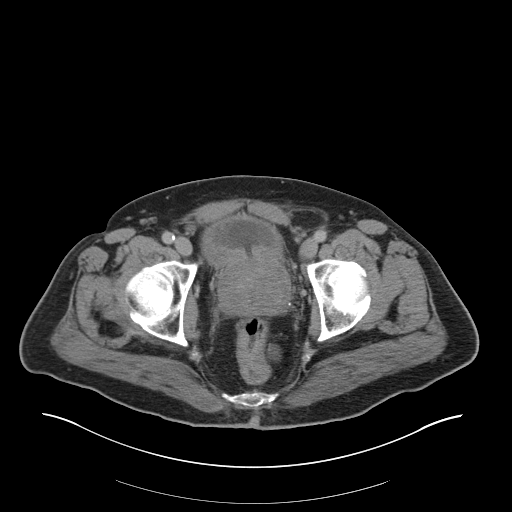
[im 36/108  soft-tissue]
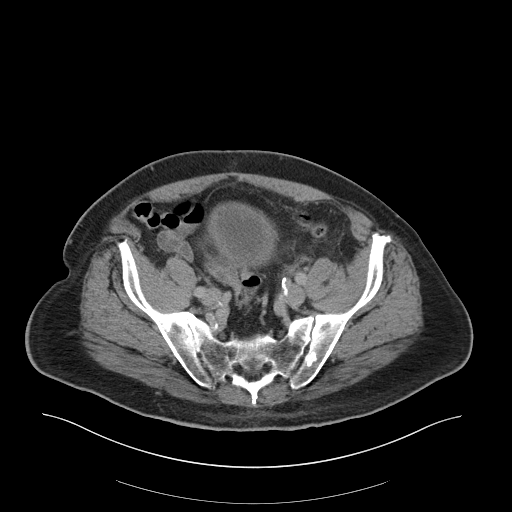
[im 43/108  soft-tissue]
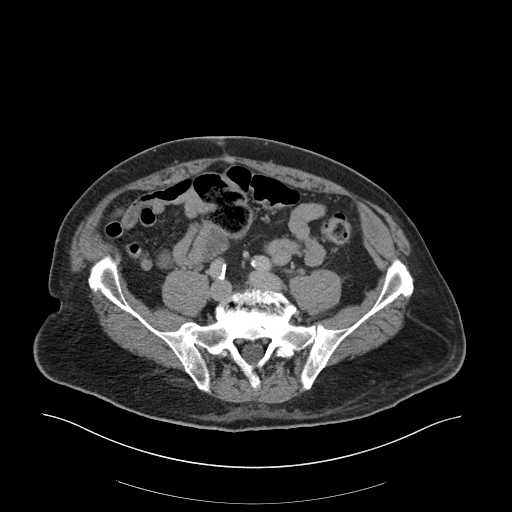
[im 50/108  soft-tissue]
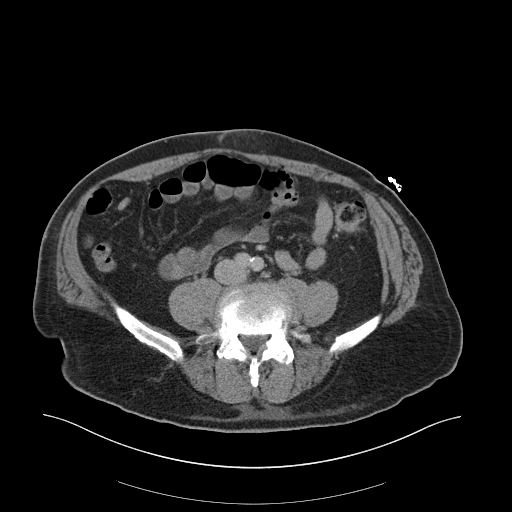
[im 58/108  soft-tissue]
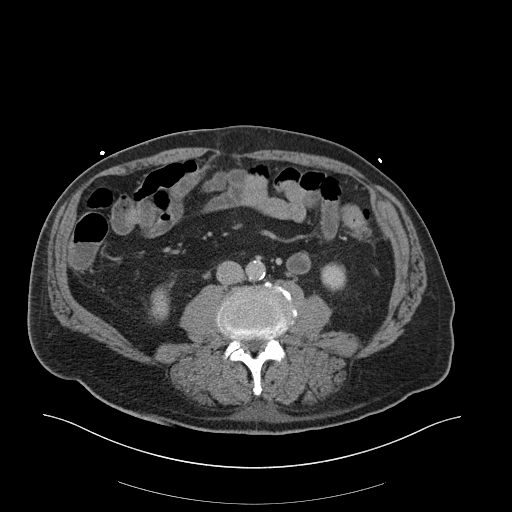
[im 65/108  soft-tissue]
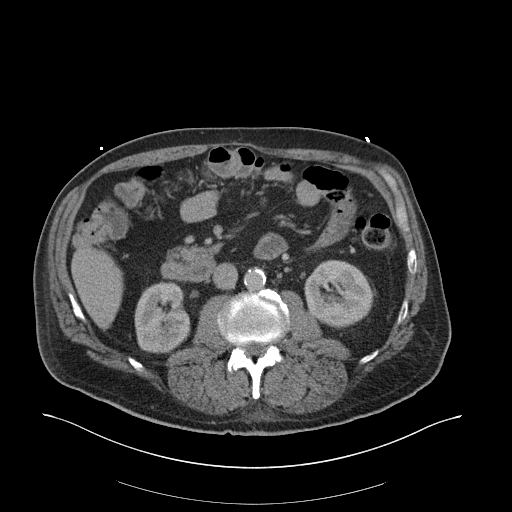
[im 72/108  soft-tissue]
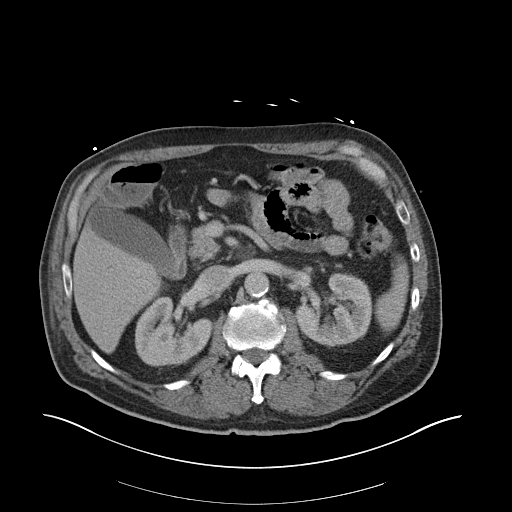
[im 72/108  bone]
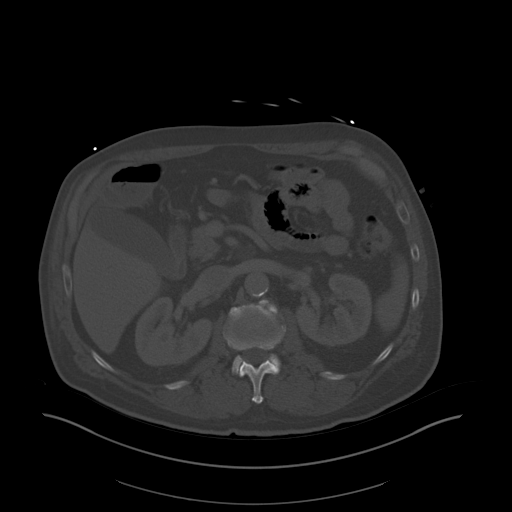
[im 86/108  soft-tissue]
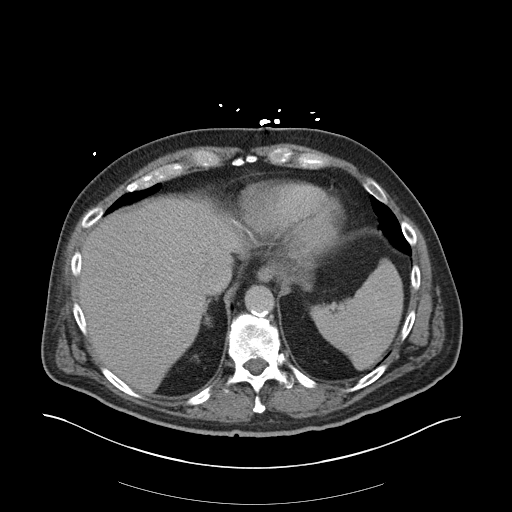
[im 93/108  soft-tissue]
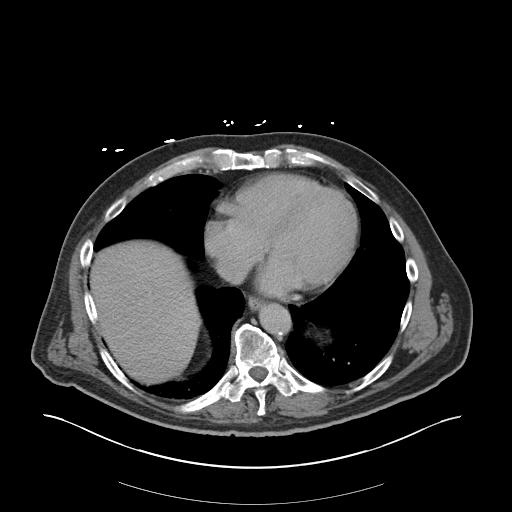
[im 100/108  soft-tissue]
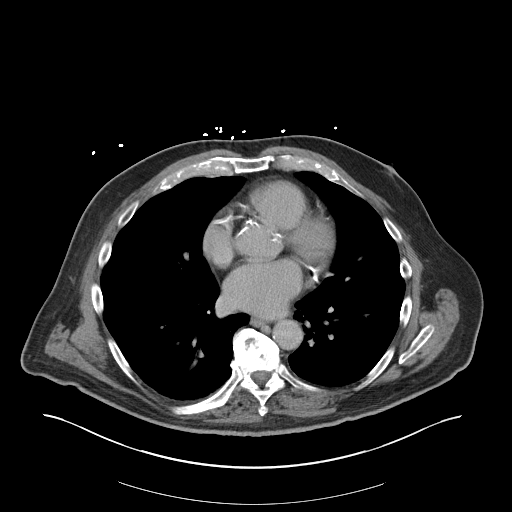

[Series 5: coronal st · coronal · 0.81mm/px · 3 of 104 slices shown]
[im 35/104  soft-tissue]
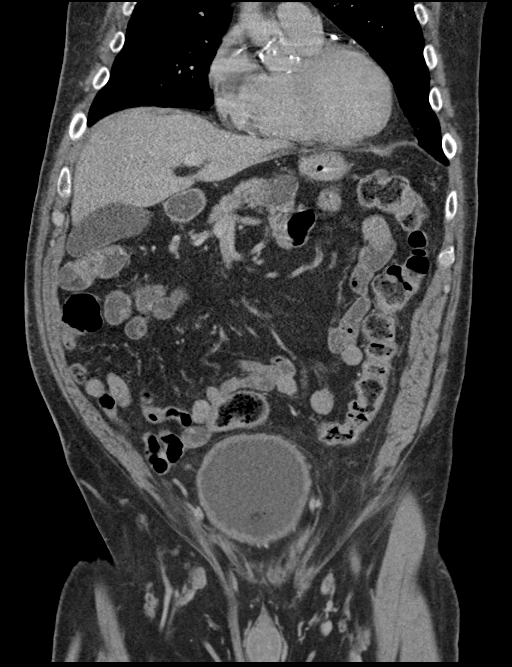
[im 46/104  soft-tissue]
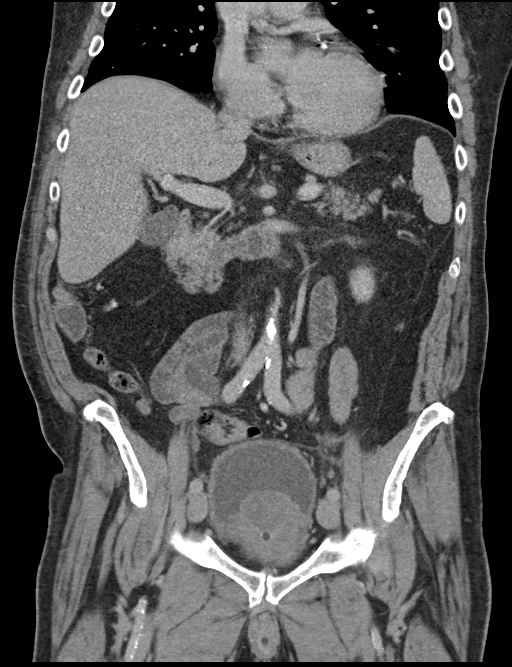
[im 58/104  soft-tissue]
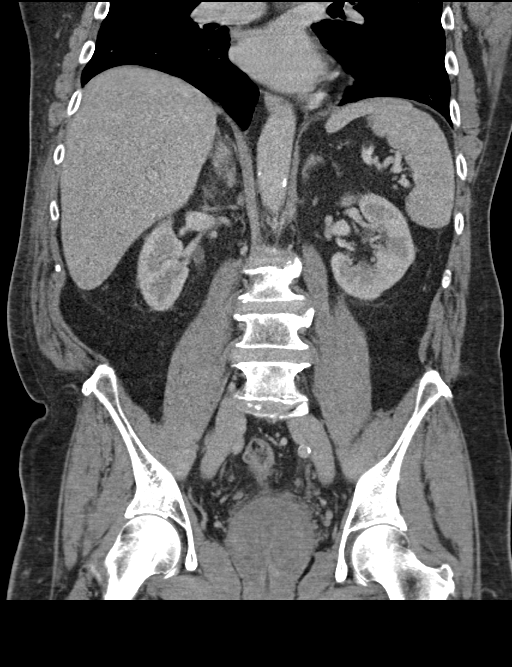

[15 of 46 positions shown; findings below may reference images not displayed]

FINDINGS: Lower chest: No acute abnormality. Minimal bibasilar
atelectasis/scarring noted.

Hepatobiliary: The liver and gallbladder are unremarkable. No
biliary dilatation.

Pancreas: A 1.5 x 2 cm cystic lesion within the pancreatic body is
noted. There is no evidence of distal pancreatic ductal dilatation
or adjacent inflammation. The remainder of the pancreas is
unremarkable.

Spleen: Unremarkable

Adrenals/Urinary Tract: The kidneys and adrenal glands are
unremarkable. Circumferential bladder wall thickening is again
noted. A Foley catheter within the bladder is present.

Stomach/Bowel: Stomach is within normal limits. No evidence of bowel
wall thickening, distention, or inflammatory changes.

Vascular/Lymphatic: Aortic atherosclerosis. No enlarged abdominal or
pelvic lymph nodes.

Reproductive: Marked prostate enlargement noted.

Other: There is mild stranding/inflammation in the low anterior
RIGHT pelvic fat without adjacent bowel abnormality. This is of
uncertain chronicity and significance. No abscess or
pneumoperitoneum.

A small to moderate supraumbilical midline ventral hernia containing
fat and a small to moderate LEFT inguinal hernia containing fat
again noted.

Musculoskeletal: No acute or suspicious bony abnormalities.
Degenerative changes within the spine again identified.
IMPRESSION: 1. Mild stranding in the low anterior LEFT pelvic fat of uncertain
chronicity but new since 5155. No abscess, pneumoperitoneum or free
fluid.
2. Circumferential bladder wall thickening with Foley catheter
within the bladder. This wall thickening may be secondary to marked
prostate enlargement or may represent cystitis. Correlate clinically
3. 1.5 x 2 cm cystic lesion within the pancreatic body. Recommend
follow up pre and post contrast MRI/MRCP or pancreatic protocol CT
in 6 months. This recommendation follows ACR consensus guidelines:
Management of Incidental Pancreatic Cysts: A White Paper of the ACR
Incidental Findings Committee. [HOSPITAL] 9009;[DATE].
4. Unchanged supraumbilical midline ventral hernia containing fat
and small to moderate LEFT inguinal hernia containing fat.
5.  Aortic Atherosclerosis (Z1CK3-F3X.X).
